# Patient Record
Sex: Male | Born: 1950
Health system: Southern US, Community
[De-identification: ages and names within clinical notes are randomized; demographics above are authoritative.]

## PROBLEM LIST (undated history)

## (undated) DIAGNOSIS — N189 Chronic kidney disease, unspecified: Secondary | ICD-10-CM

## (undated) DIAGNOSIS — K219 Gastro-esophageal reflux disease without esophagitis: Secondary | ICD-10-CM

## (undated) DIAGNOSIS — F32A Depression, unspecified: Secondary | ICD-10-CM

## (undated) DIAGNOSIS — H269 Unspecified cataract: Secondary | ICD-10-CM

## (undated) DIAGNOSIS — E785 Hyperlipidemia, unspecified: Secondary | ICD-10-CM

## (undated) DIAGNOSIS — E349 Endocrine disorder, unspecified: Secondary | ICD-10-CM

## (undated) DIAGNOSIS — B009 Herpesviral infection, unspecified: Secondary | ICD-10-CM

## (undated) DIAGNOSIS — G4730 Sleep apnea, unspecified: Secondary | ICD-10-CM

## (undated) DIAGNOSIS — G4733 Obstructive sleep apnea (adult) (pediatric): Secondary | ICD-10-CM

## (undated) DIAGNOSIS — I10 Essential (primary) hypertension: Secondary | ICD-10-CM

## (undated) DIAGNOSIS — S066X9A Traumatic subarachnoid hemorrhage with loss of consciousness of unspecified duration, initial encounter: Secondary | ICD-10-CM

## (undated) DIAGNOSIS — S065XAA Traumatic subdural hemorrhage with loss of consciousness status unknown, initial encounter: Secondary | ICD-10-CM

## (undated) DIAGNOSIS — S065X9A Traumatic subdural hemorrhage with loss of consciousness of unspecified duration, initial encounter: Secondary | ICD-10-CM

## (undated) DIAGNOSIS — Z86010 Personal history of colonic polyps: Secondary | ICD-10-CM

## (undated) DIAGNOSIS — S066XAA Traumatic subarachnoid hemorrhage with loss of consciousness status unknown, initial encounter: Secondary | ICD-10-CM

## (undated) DIAGNOSIS — M1A9XX0 Chronic gout, unspecified, without tophus (tophi): Secondary | ICD-10-CM

## (undated) DIAGNOSIS — Z9989 Dependence on other enabling machines and devices: Secondary | ICD-10-CM

## (undated) DIAGNOSIS — N529 Male erectile dysfunction, unspecified: Secondary | ICD-10-CM

## (undated) HISTORY — PX: EYE SURGERY: SHX253

## (undated) HISTORY — PX: COLONOSCOPY: SHX174

## (undated) HISTORY — PX: TENDON REPAIR: SHX5111

## (undated) HISTORY — PX: ESOPHAGOGASTRODUODENOSCOPY: SHX1529

## (undated) HISTORY — DX: Traumatic subarachnoid hemorrhage with loss of consciousness status unknown, initial encounter: S06.6XAA

## (undated) HISTORY — DX: Essential (primary) hypertension: I10

## (undated) HISTORY — DX: Unspecified cataract: H26.9

## (undated) HISTORY — DX: Chronic kidney disease, unspecified: N18.9

## (undated) HISTORY — DX: Endocrine disorder, unspecified: E34.9

## (undated) HISTORY — DX: Traumatic subdural hemorrhage with loss of consciousness of unspecified duration, initial encounter: S06.5X9A

## (undated) HISTORY — DX: Personal history of colonic polyps: Z86.010

## (undated) HISTORY — DX: Depression, unspecified: F32.A

## (undated) HISTORY — DX: Obstructive sleep apnea (adult) (pediatric): G47.33

## (undated) HISTORY — DX: Herpesviral infection, unspecified: B00.9

## (undated) HISTORY — DX: Dependence on other enabling machines and devices: Z99.89

## (undated) HISTORY — DX: Traumatic subarachnoid hemorrhage with loss of consciousness of unspecified duration, initial encounter: S06.6X9A

## (undated) HISTORY — DX: Hyperlipidemia, unspecified: E78.5

## (undated) HISTORY — DX: Traumatic subdural hemorrhage with loss of consciousness status unknown, initial encounter: S06.5XAA

## (undated) HISTORY — DX: Chronic gout, unspecified, without tophus (tophi): M1A.9XX0

## (undated) HISTORY — DX: Gastro-esophageal reflux disease without esophagitis: K21.9

## (undated) HISTORY — DX: Sleep apnea, unspecified: G47.30

## (undated) HISTORY — DX: Male erectile dysfunction, unspecified: N52.9

---
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
## 2001-01-20 ENCOUNTER — Ambulatory Visit (HOSPITAL_COMMUNITY): Admission: RE | Admit: 2001-01-20 | Discharge: 2001-01-20 | Payer: Self-pay

## 2005-06-14 ENCOUNTER — Encounter: Admission: RE | Admit: 2005-06-14 | Discharge: 2005-06-14 | Payer: Self-pay | Admitting: Nephrology

## 2005-07-12 ENCOUNTER — Encounter: Admission: RE | Admit: 2005-07-12 | Discharge: 2005-07-12 | Payer: Self-pay | Admitting: Nephrology

## 2007-04-25 ENCOUNTER — Ambulatory Visit (HOSPITAL_COMMUNITY): Admission: RE | Admit: 2007-04-25 | Discharge: 2007-04-25 | Payer: Self-pay | Admitting: *Deleted

## 2007-06-21 ENCOUNTER — Encounter (INDEPENDENT_AMBULATORY_CARE_PROVIDER_SITE_OTHER): Payer: Self-pay | Admitting: Surgery

## 2007-06-21 ENCOUNTER — Ambulatory Visit (HOSPITAL_COMMUNITY): Admission: RE | Admit: 2007-06-21 | Discharge: 2007-06-21 | Payer: Self-pay | Admitting: General Surgery

## 2010-08-11 NOTE — Op Note (Signed)
NAMEJUSTINN, Devin Howell             ACCOUNT NO.:  [id number]   MEDICAL RECORD NO.:  [id number]          PATIENT TYPE:  AMB   LOCATION:  DAY                          FACILITY:  [facility]   PHYSICIAN:  Thomas A. Cornett, M.D.DATE OF BIRTH:  10-Sep-1950   DATE OF PROCEDURE:  06/21/2007  DATE OF DISCHARGE:                               OPERATIVE REPORT   PREOPERATIVE DIAGNOSIS:  Prolapse, grade 3 internal hemorrhoids.   POSTOPERATIVE DIAGNOSIS:  Prolapse, grade 3 internal hemorrhoids.   PROCEDURE:  Procedure for prolapse and hemorrhoids (PPH).   SURGEON:  Dortha Schwalbe, MD.   ASSISTANTOllen Gross. Vernell Morgans, MD   ANESTHESIA:  General endotracheal anesthesia, 0.25% Sensorcaine local.   ESTIMATED BLOOD LOSS:  Twenty mL.   SPECIMEN:  A ring of hemorrhoidal tissue to pathology.   INDICATIONS FOR PROCEDURE:  The patient is a 60 year old male who has  had longstanding internal hemorrhoids.  We discussed options including  hemorrhoidectomy versus PPH and he elected to go with PPH.  I discussed  with him the complications that were unique with PPH which include  bleeding, infection and a recurrence rate of hemorrhoids about 20% even  with a successful PPH.  I discussed hemorrhoidectomy as well with him as  an alternative but he wished to undergo PPH due to decreased postop  pain.  After discussing the risks of the procedure he proceeded to agree  with PPH.   DESCRIPTION OF PROCEDURE:  The patient was brought to the operating room  and placed supine.  He was intubated and placed under general anesthesia  and then placed in a prone jackknife position, appropriately padded.  The buttocks taped apart.  We jackknifed the bed.  After sterile prep  and drape of the anal canal digital examination was done.  Tone was  normal.  We then used two fingers to dilate up his anal canal and then  the large anoscope was placed.  After placing the scope we examined and  saw he had significant grade 3 prolapsed  hemorrhoids but very little  external disease.  We then placed a pursestring suture of 2-0 Prolene 4  cm above the dentate line circumferentially only getting mucosal bites.  After we did this we placed the retractor that comes with the [place]  stapling device.  We were able to place this retractor in while pulling  the suture out the center of it.  We then brought the [facility] stapling  device on to the field.  We slid the anvil past the suture and then tied  the pursestring suture down around it.  The tails were brought out  through the device and air knot was tied.  Under tension we then turned  the stapler and close down the jaws as we advanced the stapler until the  4 cm mark was below the dentate line.  As we pulled up on the suture  unfortunately simultaneously we fired the stapler and the suture broke.  We removed the stapling device.  There was no tissue in the stapling  device.  We examined and saw that one half row  of staples in the  patient's posterior area had fired but no tissue was amputated with it.  We elected to repeat the procedure since this tissue slid out of the  stapler right before we fired it and it was unsuccessful.  I placed  another pursestring suture in a similar fashion above the staple line  circumferentially.  Once this was then placed a second stapler was  brought in to the field.  We were able to advance it and feel it pop  into place appropriately above the pursestring suture.  We then tied the  pursestring suture down in a similar fashion as before, brought out the  sutures through the stapling device.  We then closed the jaws of the EEA  stapler that is designed specifically for this procedure down slowly  until we had closed it all the way.  We then fired it and this had a  much better sound to it and felt more like we had better tissue in it.  After we removed the stapler we examined the ring of tissue.  It was  symmetrical.  It consisted of mucosa and  hemorrhoid tissue only without  any muscle or any other signs of any deeper tissue penetration.  Staple  line had a little bit of oozing and over sewed four points with some pop-  off #2 Vicryl sutures.  The staple line appeared to be approximately 4  cm above the dentate line.  The retractor easily went past the staple  line and also examined my finger and felt my finger go past the staple  line into the distal rectum.  Again I saw no evidence of bleeding after  irrigation.  We watched this for about 5 minutes and the staple line  looked good.  I then placed a plug of Gelfoam wrapped with Surgicel into  this area.  At this point the patient was then covered with an ABD pad.  He was then placed supine, extubated, taken to recovery in satisfactory  condition.  Estimated blood loss was approximately 20 mL for this  procedure.  The patient tolerated the procedure well.  All final counts  of sponge, needle and instruments were found to be correct for this  portion of the case.      Thomas A. Cornett, M.D.  Electronically Signed     TAC/MEDQ  D:  06/21/2007  T:  06/21/2007  Job:  [id number]   cc:   Georgiana Spinner, M.D.  Fax: [phone]

## 2010-08-11 NOTE — Op Note (Signed)
Devin Howell, PENDER NO.:  [id number]   MEDICAL RECORD NO.:  [id number]          PATIENT TYPE:  AMB   LOCATION:  ENDO                         FACILITY:  [facility]   PHYSICIAN:  Georgiana Spinner, M.D.    DATE OF BIRTH:  [birth year]   DATE OF PROCEDURE:  04/25/2007  DATE OF DISCHARGE:                               OPERATIVE REPORT   PROCEDURE:  Colonoscopy.   INDICATIONS:  Colon cancer screening.   ANESTHESIA:  Fentanyl 100 mcg, Versed 10 mg.   PROCEDURE:  With the patient mildly sedated in the left lateral  decubitus position, rectal examination was performed which was  unremarkable.  Subsequently the Pentax videoscopic colonoscope was  inserted the rectum and passed under direct vision to the cecum  identified by ileocecal valve and appendiceal orifice both which were  photographed.  From this point the colonoscope was slowly withdrawn  taking circumferential views of colonic mucosa stopping only in the  rectum which appeared normal on direct showed hemorrhoids on retroflexed  view.  The endoscope was straightened and withdrawn.  The patient's  vital signs, pulse oximeter remained stable.  The patient tolerated  procedure well without apparent complication.   FINDINGS:  Internal hemorrhoids, otherwise unremarkable exam   PLAN:  Because of family history we will repeat examination in 5 years.           ______________________________  Georgiana Spinner, M.D.     GMO/MEDQ  D:  04/25/2007  T:  04/25/2007  Job:  [id number]

## 2010-08-14 NOTE — Procedures (Signed)
[facility]  Patient:    Devin Howell, Devin Howell Visit Number: [id number] MRN: [id number]          Service Type: END Location: ENDO Attending Physician:  Sabino Gasser Proc. Date: 01/20/01 Admit Date:  01/20/2001 Discharge Date: 01/20/2001                             Procedure Report  PROCEDURE:  Colonoscopy.  INDICATION:  Colon cancer screening.  Mother with history of colon cancer.  ANESTHESIA:  Demerol 50, Versed 6.5 mg.  DESCRIPTION OF PROCEDURE:  With the patient mildly sedated in the left lateral decubitus position, the Olympus videoscopic colonoscope was inserted into the rectum after a normal rectal exam and passed under direct vision to the cecum, identified by the ileocecal valve and appendiceal orifice, both of which were photographed.  From this point, the colonoscope was slowly withdrawn, taking circumferential views of the entire colonic mucosa, stopping in the rectum which appeared normal on direct view and showed hemorrhoids on retroflexed view.  The endoscope was straightened and withdrawn.  The patients vital signs and pulse oximeter remained stable.  The patient tolerated the procedure well without apparent complications.  FINDINGS:  Internal hemorrhoids.  Otherwise unremarkable examination.  PLAN:  Repeat examination in possibly five years. Attending Physician:  Sabino Gasser DD:  01/20/01 TD:  01/22/01 Job: [id number]

## 2010-12-21 LAB — CBC
HCT: 46.7
Platelets: 191

## 2010-12-21 LAB — BASIC METABOLIC PANEL
BUN: 54 — ABNORMAL HIGH
CO2: 26
Calcium: 9.5
Chloride: 103
GFR calc Af Amer: 37 — ABNORMAL LOW
Sodium: 137

## 2010-12-21 LAB — DIFFERENTIAL
Basophils Absolute: 0
Eosinophils Absolute: 0.1
Eosinophils Relative: 2
Lymphs Abs: 1.6
Neutrophils Relative %: 68

## 2012-11-07 ENCOUNTER — Other Ambulatory Visit: Payer: Self-pay | Admitting: Neurosurgery

## 2012-11-07 DIAGNOSIS — M47817 Spondylosis without myelopathy or radiculopathy, lumbosacral region: Secondary | ICD-10-CM

## 2012-11-09 ENCOUNTER — Ambulatory Visit
Admission: RE | Admit: 2012-11-09 | Discharge: 2012-11-09 | Disposition: A | Discharge status: Home / Self Care | Payer: BC Managed Care – PPO | Source: Ambulatory Visit | Attending: Neurosurgery | Admitting: Neurosurgery

## 2012-11-09 DIAGNOSIS — M47817 Spondylosis without myelopathy or radiculopathy, lumbosacral region: Secondary | ICD-10-CM

## 2013-10-02 ENCOUNTER — Telehealth: Payer: Self-pay | Admitting: Internal Medicine

## 2013-10-04 ENCOUNTER — Encounter: Payer: Self-pay | Admitting: Internal Medicine

## 2013-12-25 NOTE — Telephone Encounter (Signed)
Appt scheduled 12-27-13/yf

## 2013-12-27 ENCOUNTER — Encounter: Payer: BC Managed Care – PPO | Admitting: Internal Medicine

## 2014-09-09 ENCOUNTER — Other Ambulatory Visit: Payer: Self-pay | Admitting: Nephrology

## 2014-09-09 DIAGNOSIS — N179 Acute kidney failure, unspecified: Secondary | ICD-10-CM

## 2014-09-11 ENCOUNTER — Ambulatory Visit
Admission: RE | Admit: 2014-09-11 | Discharge: 2014-09-11 | Disposition: A | Discharge status: Home / Self Care | Payer: Medicare Other | Source: Ambulatory Visit | Attending: Nephrology | Admitting: Nephrology

## 2014-09-11 DIAGNOSIS — N179 Acute kidney failure, unspecified: Secondary | ICD-10-CM

## 2015-01-02 ENCOUNTER — Other Ambulatory Visit (HOSPITAL_COMMUNITY): Payer: Self-pay | Admitting: Nephrology

## 2015-01-02 DIAGNOSIS — I10 Essential (primary) hypertension: Secondary | ICD-10-CM

## 2015-01-06 ENCOUNTER — Encounter (HOSPITAL_COMMUNITY): Payer: Medicare Other

## 2015-01-08 ENCOUNTER — Ambulatory Visit (HOSPITAL_COMMUNITY)
Admission: RE | Admit: 2015-01-08 | Discharge: 2015-01-08 | Disposition: A | Discharge status: Home / Self Care | Payer: Medicare Other | Source: Ambulatory Visit | Attending: Cardiovascular Disease | Admitting: Cardiovascular Disease

## 2015-01-08 DIAGNOSIS — I10 Essential (primary) hypertension: Secondary | ICD-10-CM | POA: Diagnosis not present

## 2015-07-01 ENCOUNTER — Other Ambulatory Visit: Payer: Self-pay | Admitting: Family Medicine

## 2015-07-01 DIAGNOSIS — M545 Low back pain: Secondary | ICD-10-CM

## 2015-07-06 ENCOUNTER — Other Ambulatory Visit: Payer: Medicare Other

## 2016-04-14 ENCOUNTER — Ambulatory Visit: Payer: Medicare Other | Admitting: Primary Care

## 2016-04-19 DIAGNOSIS — G4733 Obstructive sleep apnea (adult) (pediatric): Secondary | ICD-10-CM | POA: Diagnosis not present

## 2016-04-20 ENCOUNTER — Encounter: Payer: Self-pay | Admitting: Primary Care

## 2016-04-20 ENCOUNTER — Ambulatory Visit (INDEPENDENT_AMBULATORY_CARE_PROVIDER_SITE_OTHER): Payer: Medicare HMO | Admitting: Primary Care

## 2016-04-20 VITALS — BP 146/84 | HR 100 | Temp 97.8°F | Ht 66.0 in | Wt 228.4 lb

## 2016-04-20 DIAGNOSIS — E349 Endocrine disorder, unspecified: Secondary | ICD-10-CM

## 2016-04-20 DIAGNOSIS — E785 Hyperlipidemia, unspecified: Secondary | ICD-10-CM | POA: Insufficient documentation

## 2016-04-20 DIAGNOSIS — M1A9XX0 Chronic gout, unspecified, without tophus (tophi): Secondary | ICD-10-CM

## 2016-04-20 DIAGNOSIS — N189 Chronic kidney disease, unspecified: Secondary | ICD-10-CM

## 2016-04-20 DIAGNOSIS — A6000 Herpesviral infection of urogenital system, unspecified: Secondary | ICD-10-CM | POA: Diagnosis not present

## 2016-04-20 DIAGNOSIS — I10 Essential (primary) hypertension: Secondary | ICD-10-CM

## 2016-04-20 DIAGNOSIS — N183 Chronic kidney disease, stage 3 unspecified: Secondary | ICD-10-CM | POA: Insufficient documentation

## 2016-04-20 DIAGNOSIS — R69 Illness, unspecified: Secondary | ICD-10-CM | POA: Diagnosis not present

## 2016-04-20 MED ORDER — VALACYCLOVIR HCL 1 G PO TABS: ORAL_TABLET | ORAL | 5 refills | Status: DC

## 2016-04-20 MED ORDER — ALLOPURINOL 300 MG PO TABS
300.0000 mg | ORAL_TABLET | Freq: Every day | ORAL | 3 refills | Status: DC
Start: 2016-04-20 — End: 2016-04-20

## 2016-04-20 MED ORDER — VALACYCLOVIR HCL 1 G PO TABS
ORAL_TABLET | ORAL | 5 refills | Status: DC
Start: 2016-04-20 — End: 2016-04-20

## 2016-04-20 MED ORDER — ALLOPURINOL 300 MG PO TABS: 300.0000 mg | ORAL_TABLET | Freq: Every day | ORAL | 3 refills | Status: DC

## 2016-04-20 NOTE — Assessment & Plan Note (Signed)
Discussed that I do not manage testosterone injections and that he will need to follow with Urology. Referral placed for Urology evaluation and management.

## 2016-04-20 NOTE — Patient Instructions (Signed)
I sent refills for Valtrex and allopurinol to your pharmacy.  You will be contacted regarding your referral to Urology for management of testosterone deficiency.  Please let us know if you have not heard back within one week.   Please schedule a physical with me in 2018 at your convenience. You may also schedule a lab only appointment 3-4 days prior. We will discuss your lab results in detail during your physical.  It was a pleasure to meet you today! Please don't hesitate to call me with any questions. Welcome to [facility]!

## 2016-04-20 NOTE — Progress Notes (Signed)
Pre visit review using our clinic review tool, if applicable. No additional management support is needed unless otherwise documented below in the visit note. 

## 2016-04-20 NOTE — Assessment & Plan Note (Signed)
Managed on Amlodipine 5 mg, BP above goal today, continue to monitor.

## 2016-04-20 NOTE — Progress Notes (Signed)
Subjective:    Patient ID: Devin Howell, male    DOB: 09-Jan-1951, 66 y.o.   MRN: [id number]  HPI  Devin Howell is a 66 year old male who presents today to establish care and discuss the problems mentioned below. Will obtain old records. His last physical was in March-April 2017.  1) Essential Hypertension: Currently managed on Amlodipine 5 mg per his nephrologist. He denies chest pain, dizziness, visual changes.  2) Gout: Currently managed on allopurinol 300 mg daily. Strong family history in his father. His last flare was years ago.  3) Hyperlipidemia: Currently managed on atorvastatin 20 mg. His last cholesterol check was during his physical last Spring. He denies myalgias.  4) Decreased Renal Function: Following with Dr. Mercy Moore through [facility] annually. Managed on Amlodipine and Lasix. Baseline Creatinine of 1.4-1.6.  5) Herpes Simplex: Genital Herpes. No breakout in 5-6 years. He is needing a refill of his Valtrex medication as he likes to have these on hand.  6) Erectile Dysfunction/Testosterone Deficiency: Currently managed on sildenafil 20 mg. Gets a prescription for 40 pills monthly as he takes 8-10 pills at a time. Currently managed on Testosterone. He injects 1 cc every 2 weeks. This was prescribed by his prior PCP. No history of testicular injury.  Review of Systems  Eyes: Negative for visual disturbance.  Respiratory: Negative for shortness of breath.   Cardiovascular: Negative for chest pain.  Genitourinary: Negative for genital sores.       Testosterone deficiency. Erectile dysfunction.   Musculoskeletal:       No recent gout flares  Neurological: Negative for dizziness.       Past Medical History:  Diagnosis Date  . Chronic gout   . CKD (chronic kidney disease)   . Erectile dysfunction   . GERD (gastroesophageal reflux disease)   . Herpes simplex   . Hyperlipidemia   . Hypertension   . OSA on CPAP   . Testosterone deficiency      Social  History   Social History  . Marital status: Married    Spouse name: N/A  . Number of children: N/A  . Years of education: N/A   Occupational History  . Not on file.   Social History Main Topics  . Smoking status: Never Smoker  . Smokeless tobacco: Never Used  . Alcohol use Yes  . Drug use: Unknown  . Sexual activity: Not on file   Other Topics Concern  . Not on file   Social History Narrative  . No narrative on file    Past Surgical History:  Procedure Laterality Date  . TONSILLECTOMY AND ADENOIDECTOMY  1960    No family history on file.  No Known Allergies  No current outpatient prescriptions on file prior to visit.   No current facility-administered medications on file prior to visit.     BP (!) 146/84   Pulse 100   Temp 97.8 F (36.6 C) (Oral)   Ht 5\' 6"  (1.676 m)   Wt 228 lb 6.4 oz (103.6 kg)   SpO2 97%   BMI 36.86 kg/m    Objective:   Physical Exam  Constitutional: He is oriented to person, place, and time. He appears well-nourished.  Neck: Neck supple.  Cardiovascular: Normal rate and regular rhythm.   Pulmonary/Chest: Effort normal and breath sounds normal. He has no wheezes. He has no rales.  Neurological: He is alert and oriented to person, place, and time.  Skin: Skin is warm and dry.  Psychiatric:  He has a normal mood and affect.          Assessment & Plan:

## 2016-04-20 NOTE — Assessment & Plan Note (Signed)
Infrequent flares since allopurinol initiation. Continue same, refills sent to pharmacy.

## 2016-04-20 NOTE — Assessment & Plan Note (Signed)
Infrequent breakouts, refill provided for Valtex.

## 2016-04-20 NOTE — Assessment & Plan Note (Signed)
Following with nephrology, baseline creatinine of 1.4-1.6. Managed on Lasix and Amlodipine.

## 2016-04-20 NOTE — Assessment & Plan Note (Signed)
Managed on atorvastatin. Will obtain records for labs. Repeat during physical this Spring/Summer.

## 2016-04-27 ENCOUNTER — Other Ambulatory Visit: Payer: Self-pay | Admitting: Primary Care

## 2016-04-27 DIAGNOSIS — N529 Male erectile dysfunction, unspecified: Secondary | ICD-10-CM

## 2016-04-27 NOTE — Telephone Encounter (Signed)
Received faxed refill request for  sildenafil (REVATIO) 20 MG tablet  Medication have not been prescribed by Devin Howell. Last seen on 04/20/2016

## 2016-04-28 MED ORDER — SILDENAFIL CITRATE 20 MG PO TABS: ORAL_TABLET | ORAL | 5 refills | Status: DC

## 2016-05-09 ENCOUNTER — Emergency Department (HOSPITAL_COMMUNITY): Payer: Medicare HMO

## 2016-05-09 ENCOUNTER — Encounter (HOSPITAL_COMMUNITY): Payer: Self-pay | Admitting: Emergency Medicine

## 2016-05-09 ENCOUNTER — Emergency Department (HOSPITAL_COMMUNITY)
Admission: EM | Admit: 2016-05-09 | Discharge: 2016-05-09 | Disposition: A | Discharge status: Home / Self Care | Payer: Medicare HMO | Attending: Emergency Medicine | Admitting: Emergency Medicine

## 2016-05-09 DIAGNOSIS — J9801 Acute bronchospasm: Secondary | ICD-10-CM | POA: Diagnosis not present

## 2016-05-09 DIAGNOSIS — N189 Chronic kidney disease, unspecified: Secondary | ICD-10-CM | POA: Diagnosis not present

## 2016-05-09 DIAGNOSIS — Z79899 Other long term (current) drug therapy: Secondary | ICD-10-CM | POA: Diagnosis not present

## 2016-05-09 DIAGNOSIS — R0600 Dyspnea, unspecified: Secondary | ICD-10-CM

## 2016-05-09 DIAGNOSIS — I129 Hypertensive chronic kidney disease with stage 1 through stage 4 chronic kidney disease, or unspecified chronic kidney disease: Secondary | ICD-10-CM | POA: Insufficient documentation

## 2016-05-09 DIAGNOSIS — R0602 Shortness of breath: Secondary | ICD-10-CM | POA: Diagnosis not present

## 2016-05-09 LAB — URINALYSIS, ROUTINE W REFLEX MICROSCOPIC
BACTERIA UA: NONE SEEN
BILIRUBIN URINE: NEGATIVE
Glucose, UA: NEGATIVE mg/dL
Ketones, ur: 5 mg/dL — AB
Leukocytes, UA: NEGATIVE
NITRITE: NEGATIVE
PH: 6 (ref 5.0–8.0)
Protein, ur: 30 mg/dL — AB
SPECIFIC GRAVITY, URINE: 1.006 (ref 1.005–1.030)
SQUAMOUS EPITHELIAL / LPF: NONE SEEN

## 2016-05-09 LAB — BASIC METABOLIC PANEL
ANION GAP: 15 (ref 5–15)
BUN: 14 mg/dL (ref 6–20)
CO2: 26 mmol/L (ref 22–32)
Calcium: 9.8 mg/dL (ref 8.9–10.3)
Chloride: 96 mmol/L — ABNORMAL LOW (ref 101–111)
Creatinine, Ser: 1.4 mg/dL — ABNORMAL HIGH (ref 0.61–1.24)
GFR calc Af Amer: 59 mL/min — ABNORMAL LOW (ref >60)
GFR, EST NON AFRICAN AMERICAN: 50 mL/min — AB (ref >60)
GLUCOSE: 127 mg/dL — AB (ref 65–99)
POTASSIUM: 3.2 mmol/L — AB (ref 3.5–5.1)
Sodium: 137 mmol/L (ref 135–145)

## 2016-05-09 LAB — CBC
HEMATOCRIT: 44.3 % (ref 39.0–52.0)
HEMOGLOBIN: 15.3 g/dL (ref 13.0–17.0)
MCH: 34 pg (ref 26.0–34.0)
MCHC: 34.5 g/dL (ref 30.0–36.0)
MCV: 98.4 fL (ref 78.0–100.0)
Platelets: 133 10*3/uL — ABNORMAL LOW (ref 150–400)
RBC: 4.5 MIL/uL (ref 4.22–5.81)
RDW: 13.6 % (ref 11.5–15.5)
WBC: 5.5 10*3/uL (ref 4.0–10.5)

## 2016-05-09 LAB — I-STAT TROPONIN, ED: TROPONIN I, POC: 0.01 ng/mL (ref 0.00–0.08)

## 2016-05-09 LAB — BRAIN NATRIURETIC PEPTIDE: B NATRIURETIC PEPTIDE 5: 22.4 pg/mL (ref 0.0–100.0)

## 2016-05-09 MED ORDER — ALBUTEROL SULFATE (2.5 MG/3ML) 0.083% IN NEBU
5.0000 mg | INHALATION_SOLUTION | Freq: Once | RESPIRATORY_TRACT | Status: AC
  Administered 2016-05-09: 5 mg via RESPIRATORY_TRACT
  Filled 2016-05-09: qty 6

## 2016-05-09 MED ORDER — ALBUTEROL SULFATE HFA 108 (90 BASE) MCG/ACT IN AERS
1.0000 | INHALATION_SPRAY | RESPIRATORY_TRACT | Status: DC | PRN
  Administered 2016-05-09: 2 via RESPIRATORY_TRACT
  Filled 2016-05-09: qty 6.7

## 2016-05-09 MED ORDER — PREDNISONE 20 MG PO TABS: ORAL_TABLET | ORAL | 0 refills | Status: DC

## 2016-05-09 NOTE — ED Provider Notes (Signed)
[place] DEPT Provider Note   CSN: [id number] Arrival date & time: 05/09/16  1104     History   Chief Complaint Chief Complaint  Patient presents with  . Shortness of Breath  . Dizziness    HPI Devin Howell is a 66 y.o. male.  HPI Patient states he wears CPAP at night. Had worsening shortness of breath throughout the night. Denied chest pain or cough. Shortness breath is worse with lying flat. No new lower extremity swelling or asymmetry. No recent extended travel or immobilization. Denies fever or chills. Past Medical History:  Diagnosis Date  . Chronic gout   . CKD (chronic kidney disease)   . Erectile dysfunction   . GERD (gastroesophageal reflux disease)   . Herpes simplex   . Hyperlipidemia   . Hypertension   . OSA on CPAP   . Testosterone deficiency     Patient Active Problem List   Diagnosis Date Noted  . Chronic gout without tophus 04/20/2016  . Genital herpes simplex 04/20/2016  . Testosterone deficiency 04/20/2016  . CKD (chronic kidney disease) 04/20/2016  . Essential hypertension 04/20/2016  . Hyperlipidemia 04/20/2016    Past Surgical History:  Procedure Laterality Date  . TONSILLECTOMY AND ADENOIDECTOMY  1960       Home Medications    Prior to Admission medications   Medication Sig Start Date End Date Taking? Authorizing Provider  allopurinol (ZYLOPRIM) 300 MG tablet Take 1 tablet (300 mg total) by mouth daily. 04/20/16  Yes Pleas Koch, NP  amLODipine (NORVASC) 5 MG tablet Take 5 mg by mouth daily.   Yes Historical Provider, MD  atorvastatin (LIPITOR) 20 MG tablet Take 20 mg by mouth daily.   Yes Historical Provider, MD  clobetasol ointment (TEMOVATE) [id number] % Apply 1 application topically as needed (for ecsema).  04/05/16  Yes Historical Provider, MD  furosemide (LASIX) 40 MG tablet Take 40 mg by mouth daily.   Yes Historical Provider, MD  testosterone cypionate (DEPOTESTOTERONE CYPIONATE) 100 MG/ML injection Inject 200 mg into  the muscle every 14 (fourteen) days. For IM use only   Yes Historical Provider, MD  predniSONE (DELTASONE) 20 MG tablet 3 tabs po day one, then 2 tabs daily x 4 days 05/09/16   Julianne Rice, MD  sildenafil (REVATIO) 20 MG tablet Take 3-5 tablets by mouth as needed. 04/28/16   Pleas Koch, NP  valACYclovir (VALTREX) 1000 MG tablet Take 1 tablet by mouth twice daily for 3-5 days as needed for outbreaks. Patient taking differently: Take 1,000 mg by mouth 2 (two) times daily as needed (for outbreaks).  04/20/16   Pleas Koch, NP    Family History No family history on file.  Social History Social History  Substance Use Topics  . Smoking status: Never Smoker  . Smokeless tobacco: Never Used  . Alcohol use Yes     Allergies   Patient has no known allergies.   Review of Systems Review of Systems  Constitutional: Negative for chills, fatigue and fever.  HENT: Negative for congestion, sinus pain, sinus pressure and sore throat.   Respiratory: Positive for shortness of breath. Negative for cough, chest tightness and wheezing.   Cardiovascular: Negative for chest pain, palpitations and leg swelling.  Gastrointestinal: Negative for abdominal pain, constipation, diarrhea, nausea and vomiting.  Musculoskeletal: Negative for back pain, myalgias, neck pain and neck stiffness.  Skin: Negative for rash and wound.  Neurological: Positive for light-headedness. Negative for dizziness, weakness, numbness and headaches.  Psychiatric/Behavioral: The  patient is nervous/anxious.   All other systems reviewed and are negative.    Physical Exam Updated Vital Signs BP 100/75   Pulse 85   Temp 97.8 F (36.6 C) (Oral)   Resp 16   Ht 5' 7.5" (1.715 m)   Wt 227 lb (103 kg)   SpO2 94%   BMI 35.03 kg/m   Physical Exam  Constitutional: He is oriented to person, place, and time. He appears well-developed and well-nourished.  Anxious appearing  HENT:  Head: Normocephalic and atraumatic.    Mouth/Throat: Oropharynx is clear and moist. No oropharyngeal exudate.  Eyes: EOM are normal. Pupils are equal, round, and reactive to light.  Neck: Normal range of motion. Neck supple. No JVD present.  Cardiovascular: Normal rate and regular rhythm.  Exam reveals no gallop and no friction rub.   No murmur heard. Pulmonary/Chest: Effort normal.  Diminished air movement and a few crackles in bilateral bases  Abdominal: Soft. Bowel sounds are normal. There is no tenderness. There is no rebound and no guarding.  Musculoskeletal: Normal range of motion. He exhibits edema. He exhibits no tenderness.  1+ bilateral pitting edema. No calf tenderness or asymmetry.  Neurological: He is alert and oriented to person, place, and time.  Moves all extremities without deficit. Sensation fully intact.  Skin: Skin is warm and dry. Capillary refill takes less than 2 seconds. No rash noted. No erythema.  Psychiatric: His behavior is normal.  Anxious appearing  Nursing note and vitals reviewed.    ED Treatments / Results  Labs (all labs ordered are listed, but only abnormal results are displayed) Labs Reviewed  BASIC METABOLIC PANEL - Abnormal; Notable for the following:       Result Value   Potassium 3.2 (*)    Chloride 96 (*)    Glucose, Bld 127 (*)    Creatinine, Ser 1.40 (*)    GFR calc non Af Amer 50 (*)    GFR calc Af Amer 59 (*)    All other components within normal limits  CBC - Abnormal; Notable for the following:    Platelets 133 (*)    All other components within normal limits  URINALYSIS, ROUTINE W REFLEX MICROSCOPIC - Abnormal; Notable for the following:    Color, Urine STRAW (*)    Hgb urine dipstick SMALL (*)    Ketones, ur 5 (*)    Protein, ur 30 (*)    All other components within normal limits  BRAIN NATRIURETIC PEPTIDE  I-STAT TROPOININ, ED  CBG MONITORING, ED    EKG  EKG Interpretation None       Radiology Dg Chest 2 View  Result Date: 05/09/2016 CLINICAL DATA:   Shortness of Breath EXAM: CHEST  2 VIEW COMPARISON:  01/05/2016 FINDINGS: Lingular scarring. Low lung volumes. No confluent airspace opacities or effusions. Heart is normal size. IMPRESSION: No active cardiopulmonary disease. Electronically Signed   By: Rolm Baptise M.D.   On: 05/09/2016 11:55    Procedures Procedures (including critical care time)  Medications Ordered in ED Medications  albuterol (PROVENTIL HFA;VENTOLIN HFA) 108 (90 Base) MCG/ACT inhaler 1-2 puff (2 puffs Inhalation Given 05/09/16 1550)  albuterol (PROVENTIL) (2.5 MG/3ML) 0.083% nebulizer solution 5 mg (5 mg Nebulization Given 05/09/16 1255)     Initial Impression / Assessment and Plan / ED Course  I have reviewed the triage vital signs and the nursing notes.  Pertinent labs & imaging results that were available during my care of the patient were reviewed by  me and considered in my medical decision making (see chart for details).     Patient states he is feeling much better after breathing treatment. Chest x-ray is normal. Will start on short course of prednisone and give albuterol inhaler. Return precautions given.  Final Clinical Impressions(s) / ED Diagnoses   Final diagnoses:  Dyspnea, unspecified type  Bronchospasm    New Prescriptions Discharge Medication List as of 05/09/2016  3:44 PM    START taking these medications   Details  predniSONE (DELTASONE) 20 MG tablet 3 tabs po day one, then 2 tabs daily x 4 days, Print         Julianne Rice, MD 05/09/16 1651

## 2016-05-09 NOTE — ED Triage Notes (Signed)
Pt reports that he uses a CPAP machine at night and seemed like it was not working last night. Pt began to experience dizziness and shortness of breath. Pt went out to couch to try to rest but could not due to unable to catch his breath.

## 2016-06-08 ENCOUNTER — Other Ambulatory Visit: Payer: Self-pay | Admitting: Primary Care

## 2016-06-08 DIAGNOSIS — R066 Hiccough: Secondary | ICD-10-CM | POA: Diagnosis not present

## 2016-06-08 NOTE — Telephone Encounter (Signed)
Received refill request from pharmacy for  Chlorpromazine 25 mg, take one tablet by mouth 3 times a day as needed for hiccups  Medication is not on medication list Note shows that this was ordinally prescribed by Dr. Yaakov Guthrie.

## 2016-06-09 ENCOUNTER — Other Ambulatory Visit: Payer: Self-pay | Admitting: Primary Care

## 2016-06-09 DIAGNOSIS — K219 Gastro-esophageal reflux disease without esophagitis: Secondary | ICD-10-CM

## 2016-06-09 MED ORDER — PANTOPRAZOLE SODIUM 20 MG PO TBEC: 20.0000 mg | DELAYED_RELEASE_TABLET | Freq: Every day | ORAL | 1 refills | Status: DC

## 2016-06-10 ENCOUNTER — Encounter: Payer: Self-pay | Admitting: Primary Care

## 2016-06-10 ENCOUNTER — Ambulatory Visit (INDEPENDENT_AMBULATORY_CARE_PROVIDER_SITE_OTHER): Payer: Medicare HMO | Admitting: Primary Care

## 2016-06-10 ENCOUNTER — Ambulatory Visit: Payer: Medicare HMO | Admitting: Primary Care

## 2016-06-10 VITALS — BP 156/100 | HR 93 | Temp 98.1°F | Ht 66.0 in | Wt 225.8 lb

## 2016-06-10 DIAGNOSIS — R0602 Shortness of breath: Secondary | ICD-10-CM

## 2016-06-10 DIAGNOSIS — N189 Chronic kidney disease, unspecified: Secondary | ICD-10-CM

## 2016-06-10 DIAGNOSIS — R066 Hiccough: Secondary | ICD-10-CM

## 2016-06-10 MED ORDER — FUROSEMIDE 40 MG PO TABS: 40.0000 mg | ORAL_TABLET | Freq: Every day | ORAL | 1 refills | Status: DC

## 2016-06-10 MED ORDER — ALBUTEROL SULFATE HFA 108 (90 BASE) MCG/ACT IN AERS: INHALATION_SPRAY | RESPIRATORY_TRACT | 0 refills | Status: DC

## 2016-06-10 NOTE — Progress Notes (Signed)
Subjective:    Patient ID: Devin Howell, male    DOB: 10-26-1950, 66 y.o.   MRN: [id number]  HPI  Devin Howell is a 66 year old male with a history of CKD, essential hypertension, hyperlipidemia who presents today with a chief complaint of hiccups with shortness of breath.  1) Hiccups: History of 1 episode of recurrent hiccups 1 year ago which lasted several days. Over the past week he's had persistent hiccups with vomiting and shortness of breath. He's had little to no sleep due to these symptoms. He felt as though he had a blockage to his mid chest so he presented to the Urgent Care Monday this week. He was provided with a prescription for metoclopramide for hiccups and nausea, and an albuterol inhaler.  For his prior episode of hiccups 1 year ago he was provided with a prescription for chlorpromazine and pantoprazole 40 mg. He was provided with a prescription for pantoprazole 20 mg tablets earlier this week to use for hiccups.  Overall he's noticed improvement on metoclopramide and has taken 1 dose of pantoprazole. Symptoms have not dissipated but he's able to get some sleep at night. He's used the albuterol inhaler with much improvement to shortness of breath during his acute episodes. He denies chest pain, dizziness, weakness, fatigue, nausea, diaphoresis. He has noticed some esophageal burning and fullness to the epigastric region.  Review of Systems  Constitutional: Negative for fever.  Respiratory: Positive for shortness of breath. Negative for cough and wheezing.   Cardiovascular: Negative for chest pain.  Gastrointestinal: Positive for vomiting. Negative for abdominal pain and nausea.       Hiccups  Neurological: Negative for dizziness.       Past Medical History:  Diagnosis Date  . Chronic gout   . CKD (chronic kidney disease)   . Erectile dysfunction   . GERD (gastroesophageal reflux disease)   . Herpes simplex   . Hyperlipidemia   . Hypertension   . OSA on CPAP     . Testosterone deficiency      Social History   Social History  . Marital status: Married    Spouse name: N/A  . Number of children: N/A  . Years of education: N/A   Occupational History  . Not on file.   Social History Main Topics  . Smoking status: Never Smoker  . Smokeless tobacco: Never Used  . Alcohol use Yes  . Drug use: No  . Sexual activity: Not on file   Other Topics Concern  . Not on file   Social History Narrative  . No narrative on file    Past Surgical History:  Procedure Laterality Date  . TONSILLECTOMY AND ADENOIDECTOMY  1960    No family history on file.  No Known Allergies  Current Outpatient Prescriptions on File Prior to Visit  Medication Sig Dispense Refill  . allopurinol (ZYLOPRIM) 300 MG tablet Take 1 tablet (300 mg total) by mouth daily. 90 tablet 3  . amLODipine (NORVASC) 5 MG tablet Take 5 mg by mouth daily.    Marland Kitchen atorvastatin (LIPITOR) 20 MG tablet Take 20 mg by mouth daily.    . clobetasol ointment (TEMOVATE) 1.19 % Apply 1 application topically as needed (for ecsema).     . pantoprazole (PROTONIX) 20 MG tablet Take 1 tablet (20 mg total) by mouth daily. 90 tablet 1  . sildenafil (REVATIO) 20 MG tablet Take 3-5 tablets by mouth as needed. 40 tablet 5  . testosterone cypionate (DEPOTESTOTERONE CYPIONATE)  100 MG/ML injection Inject 200 mg into the muscle every 14 (fourteen) days. For IM use only    . valACYclovir (VALTREX) 1000 MG tablet Take 1 tablet by mouth twice daily for 3-5 days as needed for outbreaks. (Patient not taking: Reported on 06/10/2016) 15 tablet 5   No current facility-administered medications on file prior to visit.     BP (!) 156/100   Pulse 93   Temp 98.1 F (36.7 C) (Oral)   Ht 5\' 6"  (1.676 m)   Wt 225 lb 12.8 oz (102.4 kg)   SpO2 98%   BMI 36.45 kg/m    Objective:   Physical Exam  Constitutional: He appears well-nourished.  Neck: Neck supple.  Cardiovascular: Normal rate and regular rhythm.    Pulmonary/Chest: Effort normal and breath sounds normal. He has no wheezes.  Abdominal: Soft. Bowel sounds are normal. There is no tenderness.  Skin: Skin is warm and dry.          Assessment & Plan:

## 2016-06-10 NOTE — Progress Notes (Signed)
Pre visit review using our clinic review tool, if applicable. No additional management support is needed unless otherwise documented below in the visit note. 

## 2016-06-10 NOTE — Patient Instructions (Signed)
Start metoclopramide and pantoprazole 20 mg together daily for the next 5 days for hiccups.   Metoclopramide: Take every 6-8 hours. Pantoprazole: Take 1 tablet once daily.  Continue pantoprazole 20 mg for a total of 1 months duration.  Shortness of Breath: Inhale 2 puffs into the lungs every 6 to 8 hours as needed for wheezing and/or shortness of breath.   I sent refills of Lasix to your pharmacy.  Please notify me if no improvement by Monday next week.  It was a pleasure to see you today!

## 2016-06-10 NOTE — Assessment & Plan Note (Signed)
History of this 1 year ago, treated with chlorpromazine and pantoprazole with relief. Recent episode treated with metoclopramide at urgent care, provided patient with PPI today. Will have him continue metoclopramide for next several days the stop. Continue PPI for total of 4 weeks. He will update if no improvement.

## 2016-06-29 DIAGNOSIS — N200 Calculus of kidney: Secondary | ICD-10-CM | POA: Diagnosis not present

## 2016-06-29 DIAGNOSIS — R69 Illness, unspecified: Secondary | ICD-10-CM | POA: Diagnosis not present

## 2016-06-29 DIAGNOSIS — N5201 Erectile dysfunction due to arterial insufficiency: Secondary | ICD-10-CM | POA: Diagnosis not present

## 2016-07-19 ENCOUNTER — Other Ambulatory Visit: Payer: Self-pay | Admitting: Primary Care

## 2016-07-19 DIAGNOSIS — I10 Essential (primary) hypertension: Secondary | ICD-10-CM

## 2016-07-19 NOTE — Telephone Encounter (Signed)
Ok to refill? Electronically refill request for amLODipine (NORVASC) 5 MG tablet. Medication have not been prescribed by [facility] yet. Last seen on 06/10/2016

## 2016-07-20 DIAGNOSIS — G4733 Obstructive sleep apnea (adult) (pediatric): Secondary | ICD-10-CM | POA: Diagnosis not present

## 2016-09-06 DIAGNOSIS — N183 Chronic kidney disease, stage 3 (moderate): Secondary | ICD-10-CM | POA: Diagnosis not present

## 2016-09-06 DIAGNOSIS — Z6835 Body mass index (BMI) 35.0-35.9, adult: Secondary | ICD-10-CM | POA: Diagnosis not present

## 2016-09-06 DIAGNOSIS — I129 Hypertensive chronic kidney disease with stage 1 through stage 4 chronic kidney disease, or unspecified chronic kidney disease: Secondary | ICD-10-CM | POA: Diagnosis not present

## 2016-09-06 DIAGNOSIS — R809 Proteinuria, unspecified: Secondary | ICD-10-CM | POA: Diagnosis not present

## 2016-10-09 DIAGNOSIS — R69 Illness, unspecified: Secondary | ICD-10-CM | POA: Diagnosis not present

## 2016-10-15 ENCOUNTER — Telehealth: Payer: Self-pay

## 2016-10-15 NOTE — Telephone Encounter (Signed)
Patient is on the list for [employer] 2018 and may be a good candidate for an AWV. Please let me know if/when appt is scheduled.   

## 2016-10-19 DIAGNOSIS — R69 Illness, unspecified: Secondary | ICD-10-CM | POA: Diagnosis not present

## 2016-10-19 NOTE — Telephone Encounter (Signed)
Scheduled 12/13/16

## 2016-10-27 DIAGNOSIS — G4733 Obstructive sleep apnea (adult) (pediatric): Secondary | ICD-10-CM | POA: Diagnosis not present

## 2016-11-16 DIAGNOSIS — R69 Illness, unspecified: Secondary | ICD-10-CM | POA: Diagnosis not present

## 2016-12-08 DIAGNOSIS — R69 Illness, unspecified: Secondary | ICD-10-CM | POA: Diagnosis not present

## 2016-12-13 ENCOUNTER — Encounter (INDEPENDENT_AMBULATORY_CARE_PROVIDER_SITE_OTHER): Payer: Self-pay

## 2016-12-13 ENCOUNTER — Encounter: Payer: Self-pay | Admitting: Primary Care

## 2016-12-13 ENCOUNTER — Ambulatory Visit: Payer: Medicare HMO

## 2016-12-13 ENCOUNTER — Ambulatory Visit (INDEPENDENT_AMBULATORY_CARE_PROVIDER_SITE_OTHER): Payer: Medicare HMO | Admitting: Primary Care

## 2016-12-13 VITALS — BP 146/98 | HR 104 | Temp 98.4°F | Ht 66.0 in | Wt 229.1 lb

## 2016-12-13 DIAGNOSIS — F3342 Major depressive disorder, recurrent, in full remission: Secondary | ICD-10-CM

## 2016-12-13 DIAGNOSIS — E349 Endocrine disorder, unspecified: Secondary | ICD-10-CM | POA: Diagnosis not present

## 2016-12-13 DIAGNOSIS — A6000 Herpesviral infection of urogenital system, unspecified: Secondary | ICD-10-CM | POA: Diagnosis not present

## 2016-12-13 DIAGNOSIS — N189 Chronic kidney disease, unspecified: Secondary | ICD-10-CM

## 2016-12-13 DIAGNOSIS — R69 Illness, unspecified: Secondary | ICD-10-CM | POA: Diagnosis not present

## 2016-12-13 DIAGNOSIS — N529 Male erectile dysfunction, unspecified: Secondary | ICD-10-CM | POA: Diagnosis not present

## 2016-12-13 DIAGNOSIS — Z0000 Encounter for general adult medical examination without abnormal findings: Secondary | ICD-10-CM | POA: Insufficient documentation

## 2016-12-13 DIAGNOSIS — I10 Essential (primary) hypertension: Secondary | ICD-10-CM | POA: Diagnosis not present

## 2016-12-13 DIAGNOSIS — M1A9XX0 Chronic gout, unspecified, without tophus (tophi): Secondary | ICD-10-CM | POA: Diagnosis not present

## 2016-12-13 DIAGNOSIS — Z1211 Encounter for screening for malignant neoplasm of colon: Secondary | ICD-10-CM | POA: Diagnosis not present

## 2016-12-13 DIAGNOSIS — F329 Major depressive disorder, single episode, unspecified: Secondary | ICD-10-CM | POA: Insufficient documentation

## 2016-12-13 DIAGNOSIS — E785 Hyperlipidemia, unspecified: Secondary | ICD-10-CM | POA: Diagnosis not present

## 2016-12-13 DIAGNOSIS — Z0001 Encounter for general adult medical examination with abnormal findings: Secondary | ICD-10-CM | POA: Diagnosis not present

## 2016-12-13 DIAGNOSIS — Z125 Encounter for screening for malignant neoplasm of prostate: Secondary | ICD-10-CM

## 2016-12-13 LAB — COMPREHENSIVE METABOLIC PANEL
ALBUMIN: 4.5 g/dL (ref 3.5–5.2)
ALK PHOS: 94 U/L (ref 39–117)
ALT: 35 U/L (ref 0–53)
AST: 39 U/L — AB (ref 0–37)
BILIRUBIN TOTAL: 0.8 mg/dL (ref 0.2–1.2)
BUN: 26 mg/dL — AB (ref 6–23)
CO2: 27 mEq/L (ref 19–32)
CREATININE: 1.59 mg/dL — AB (ref 0.40–1.50)
Calcium: 9.6 mg/dL (ref 8.4–10.5)
Chloride: 98 mEq/L (ref 96–112)
GFR: 46.29 mL/min — ABNORMAL LOW (ref >60.00)
Glucose, Bld: 94 mg/dL (ref 70–99)
Potassium: 3.9 mEq/L (ref 3.5–5.1)
SODIUM: 140 meq/L (ref 135–145)
Total Protein: 7.7 g/dL (ref 6.0–8.3)

## 2016-12-13 LAB — LIPID PANEL
Cholesterol: 305 mg/dL — ABNORMAL HIGH (ref 0–200)
HDL: 192.7 mg/dL (ref >39.00)
LDL Cholesterol: 95 mg/dL (ref 0–99)
NONHDL: 112.13
Total CHOL/HDL Ratio: 2
Triglycerides: 88 mg/dL (ref 0.0–149.0)
VLDL: 17.6 mg/dL (ref 0.0–40.0)

## 2016-12-13 LAB — HEMOGLOBIN A1C: Hgb A1c MFr Bld: 5.5 % (ref 4.6–6.5)

## 2016-12-13 LAB — PSA: PSA: 0.64 ng/mL (ref 0.10–4.00)

## 2016-12-13 MED ORDER — SILDENAFIL CITRATE 100 MG PO TABS: ORAL_TABLET | ORAL | 3 refills | Status: DC

## 2016-12-13 NOTE — Progress Notes (Signed)
Subjective:    Patient ID: Devin Howell, male    DOB: 06-24-1950, 66 y.o.   MRN: [id number]  HPI  Devin Howell is a 66 year old male who presents today for complete physical and discuss the problems mentioned below.  Immunizations: -Tetanus: Believes it's been 10 years. -Influenza: Completed in September 2018 -Pneumonia: Completed Prevnar in 2016. Due for pneumovax. -Shingles: Completed Shingrix in 2018, due for second dose soon.  Diet: He endorses a healthy diet. Breakfast: Eggs, bacon/sausage, potatoes, bread Lunch: early dinner. Dinner: Patent examiner, meat (shimp, steak, chicken), vegetable, pasta   Snacks: None Desserts: Small amount nightly Beverages: Some coffee, water (4 eight ounce glasses), diet Coke. Daily alcohol consumption.  Exercise: No regular exercise.  Eye exam: Completed in 2017. Dental exam: Completes four times annually. Colonoscopy: Completed 8-9 years ago. Due.   PSA: Completed in 2016  1) Depression: Symptoms of crying, sadness, anxiety that began in April 2018 after the passing of his younger sister. Shortly after that time he went and saw a therapist. The therapist diagnosed his sister with a schizhoid disorder, was told that he did not have this. He's been seeing a psychiatrist and therapist since. He was placed on trazodone 50 mg, Cymbalta 30 mg. Since medication initiation he's felt much improved. He will see his psychiatrist tomorrow.  2) Erectile Dysfunction: Long history of this for years. Currently managed on sildenafil. Current symptoms include no erection and/or inability to maintain erection. Previously managed on Viagra and Cialis, believes he did well on both. He will take 10 tablets of the sildenafil and will get an erection but cannot maintain. He is interested in trying something else.   Review of Systems  Constitutional: Negative for unexpected weight change.  HENT: Negative for rhinorrhea.   Respiratory: Negative for cough and  shortness of breath.   Cardiovascular: Negative for chest pain.  Gastrointestinal: Negative for constipation and diarrhea.  Genitourinary: Negative for difficulty urinating.       Erectile dysfunction. Has not followed with urology for testosterone management.  Musculoskeletal: Negative for arthralgias and myalgias.  Skin: Negative for rash.  Allergic/Immunologic: Negative for environmental allergies.  Neurological: Negative for dizziness, numbness and headaches.  Psychiatric/Behavioral:       Much improved on Cymbalta and trazodone       Past Medical History:  Diagnosis Date  . Chronic gout   . CKD (chronic kidney disease)   . Erectile dysfunction   . GERD (gastroesophageal reflux disease)   . Herpes simplex   . Hyperlipidemia   . Hypertension   . OSA on CPAP   . Testosterone deficiency      Social History   Social History  . Marital status: Married    Spouse name: N/A  . Number of children: N/A  . Years of education: N/A   Occupational History  . Not on file.   Social History Main Topics  . Smoking status: Never Smoker  . Smokeless tobacco: Never Used  . Alcohol use Yes  . Drug use: No  . Sexual activity: Not on file   Other Topics Concern  . Not on file   Social History Narrative  . No narrative on file    Past Surgical History:  Procedure Laterality Date  . TONSILLECTOMY AND ADENOIDECTOMY  1960    No family history on file.  No Known Allergies  Current Outpatient Prescriptions on File Prior to Visit  Medication Sig Dispense Refill  . albuterol (PROVENTIL HFA;VENTOLIN HFA) 108 (90 Base)  MCG/ACT inhaler Inhale 2 puffs into the lungs every 4-6 hours as needed for shortness of breath. 1 Inhaler 0  . allopurinol (ZYLOPRIM) 300 MG tablet Take 1 tablet (300 mg total) by mouth daily. 90 tablet 3  . amLODipine (NORVASC) 5 MG tablet TAKE 1 TABLET BY MOUTH DAILY 90 tablet 1  . atorvastatin (LIPITOR) 20 MG tablet Take 20 mg by mouth daily.    . clobetasol  ointment (TEMOVATE) 0.96 % Apply 1 application topically as needed (for ecsema).     . furosemide (LASIX) 40 MG tablet Take 1 tablet (40 mg total) by mouth daily. 90 tablet 1  . pantoprazole (PROTONIX) 20 MG tablet Take 1 tablet (20 mg total) by mouth daily. 90 tablet 1  . testosterone cypionate (DEPOTESTOTERONE CYPIONATE) 100 MG/ML injection Inject 200 mg into the muscle every 14 (fourteen) days. For IM use only    . valACYclovir (VALTREX) 1000 MG tablet Take 1 tablet by mouth twice daily for 3-5 days as needed for outbreaks. 15 tablet 5   No current facility-administered medications on file prior to visit.     BP (!) 146/98   Pulse (!) 104   Temp 98.4 F (36.9 C) (Oral)   Ht 5\' 6"  (1.676 m)   Wt 229 lb 1.9 oz (103.9 kg)   SpO2 96%   BMI 36.98 kg/m    Objective:   Physical Exam  Constitutional: He is oriented to person, place, and time. He appears well-nourished.  HENT:  Right Ear: Tympanic membrane and ear canal normal.  Left Ear: Tympanic membrane and ear canal normal.  Nose: Nose normal. Right sinus exhibits no maxillary sinus tenderness and no frontal sinus tenderness. Left sinus exhibits no maxillary sinus tenderness and no frontal sinus tenderness.  Mouth/Throat: Oropharynx is clear and moist.  Eyes: Pupils are equal, round, and reactive to light. Conjunctivae and EOM are normal.  Neck: Neck supple. Carotid bruit is not present. No thyromegaly present.  Cardiovascular: Normal rate, regular rhythm and normal heart sounds.   Pulmonary/Chest: Effort normal and breath sounds normal. He has no wheezes. He has no rales.  Abdominal: Soft. Bowel sounds are normal. There is no tenderness.  Musculoskeletal: Normal range of motion.  Neurological: He is alert and oriented to person, place, and time. He has normal reflexes. No cranial nerve deficit.  Skin: Skin is warm and dry.  Psychiatric: He has a normal mood and affect.          Assessment & Plan:

## 2016-12-13 NOTE — Assessment & Plan Note (Signed)
We'll have him start monitoring home blood pressure levels. Currently under a lot of stress at work, will be retiring soon.

## 2016-12-13 NOTE — Patient Instructions (Signed)
Complete lab work prior to leaving today. I will notify you of your results once received.   You will be contacted regarding your referral to GI for the colonoscopy.  Please let us know if you have not heard back within one week.   Start exercising. You should be getting 150 minutes of moderate intensity exercise weekly.  Ensure you are consuming 64 ounces of water daily.  I sent Viagra 100 mg tablets to your pharmacy. Take 1/2 to 1 tablet by mouth 30 minutes prior to intercourse. Please update me in a few weeks if this is ineffective.  Schedule an appointment with your Urologist as discussed.  Monitor your blood pressure, it should run less than 140/90.  Follow up in 1 year for your annual exam or sooner if needed.  It was a pleasure to see you today!

## 2016-12-13 NOTE — Assessment & Plan Note (Signed)
Encouraged him to schedule follow-up visit with urology for retesting of testosterone levels given increased difficulty erections.

## 2016-12-13 NOTE — Assessment & Plan Note (Signed)
Following with nephrology annually. Endorses stable creatinine levels.

## 2016-12-13 NOTE — Assessment & Plan Note (Signed)
Lipid panel pending today. Discussed the importance of a healthy diet and regular exercise in order for weight loss, and to reduce the risk of other medical problems.

## 2016-12-13 NOTE — Assessment & Plan Note (Signed)
Increased difficulty over the last 6 months. Could be related to testosterone levels, will have him see urology for follow-up. Prescription for brand Viagra with coupon provided today as he's historically done well on this regimen in the past. He will update if no improvement, consider daily Cialis at that point.

## 2016-12-13 NOTE — Assessment & Plan Note (Signed)
Tetanus and Pneumovax due today, due to recent weather conditions vaccinations are not available in the office. He will return within the next week to have these administered. PSA pending. Colonoscopy due, referral placed. Discussed the importance of a healthy diet and regular exercise in order for weight loss, and to reduce the risk of other medical problems. Exam today unremarkable. Labs pending. Follow-up in one year for reevaluation.

## 2016-12-13 NOTE — Assessment & Plan Note (Signed)
Infrequent outbreaks. Continue Valtrex as needed.

## 2016-12-13 NOTE — Assessment & Plan Note (Signed)
Following with psychiatry and therapy. Doing very well on Cymbalta and trazodone, continue same.

## 2016-12-13 NOTE — Assessment & Plan Note (Signed)
No flares. Continue allopurinol once daily.

## 2016-12-14 DIAGNOSIS — R69 Illness, unspecified: Secondary | ICD-10-CM | POA: Diagnosis not present

## 2016-12-15 ENCOUNTER — Telehealth: Payer: Self-pay | Admitting: Primary Care

## 2016-12-15 NOTE — Telephone Encounter (Signed)
Left message asking pt to call office regard referral

## 2016-12-16 ENCOUNTER — Ambulatory Visit: Payer: Medicare HMO

## 2016-12-16 ENCOUNTER — Ambulatory Visit (INDEPENDENT_AMBULATORY_CARE_PROVIDER_SITE_OTHER): Payer: Medicare HMO

## 2016-12-16 VITALS — BP 142/90 | HR 99 | Temp 98.5°F | Ht 66.0 in | Wt 226.5 lb

## 2016-12-16 DIAGNOSIS — Z23 Encounter for immunization: Secondary | ICD-10-CM | POA: Diagnosis not present

## 2016-12-16 DIAGNOSIS — Z0000 Encounter for general adult medical examination without abnormal findings: Secondary | ICD-10-CM

## 2016-12-16 DIAGNOSIS — Z1159 Encounter for screening for other viral diseases: Secondary | ICD-10-CM

## 2016-12-16 NOTE — Progress Notes (Signed)
I reviewed health advisor's note, was available for consultation, and agree with documentation and plan.  

## 2016-12-16 NOTE — Progress Notes (Signed)
PCP notes:   Health maintenance:  Tetanus vaccine - administered PPSV23 - administered Hep C screening - completed Colonoscopy - addressed; planning to schedule appt  Abnormal screenings:   None  Patient concerns:   None  Nurse concerns:  None  Next PCP appt:   N/A; CPE on 12/13/16

## 2016-12-16 NOTE — Progress Notes (Signed)
Pre visit review using our clinic review tool, if applicable. No additional management support is needed unless otherwise documented below in the visit note. 

## 2016-12-16 NOTE — Patient Instructions (Signed)
Devin Howell , Thank you for taking time to come for your Medicare Wellness Visit. I appreciate your ongoing commitment to your health goals. Please review the following plan we discussed and let me know if I can assist you in the future.   These are the goals we discussed: Goals    . Increase physical activity          Starting 12/16/2016, I will continue to walk at least 5 miles 4 days per week.        This is a list of the screening recommended for you and due dates:  Health Maintenance  Topic Date Due  . Colon Cancer Screening  12/17/2026*  . Tetanus Vaccine  12/17/2026  . Flu Shot  Completed  .  Hepatitis C: One time screening is recommended by Center for Disease Control  (CDC) for  adults born from [birth year] through 1966.   Completed  . Pneumonia vaccines  Completed  *Topic was postponed. The date shown is not the original due date.   Preventive Care for Adults  A healthy lifestyle and preventive care can promote health and wellness. Preventive health guidelines for adults include the following key practices.  . A routine yearly physical is a good way to check with your health care provider about your health and preventive screening. It is a chance to share any concerns and updates on your health and to receive a thorough exam.  . Visit your dentist for a routine exam and preventive care every 6 months. Brush your teeth twice a day and floss once a day. Good oral hygiene prevents tooth decay and gum disease.  . The frequency of eye exams is based on your age, health, family medical history, use  of contact lenses, and other factors. Follow your health care provider's ecommendations for frequency of eye exams.  . Eat a healthy diet. Foods like vegetables, fruits, whole grains, low-fat dairy products, and lean protein foods contain the nutrients you need without too many calories. Decrease your intake of foods high in solid fats, added sugars, and salt. Eat the right amount of  calories for you. Get information about a proper diet from your health care provider, if necessary.  . Regular physical exercise is one of the most important things you can do for your health. Most adults should get at least 150 minutes of moderate-intensity exercise (any activity that increases your heart rate and causes you to sweat) each week. In addition, most adults need muscle-strengthening exercises on 2 or more days a week.  Silver Sneakers may be a benefit available to you. To determine eligibility, you may visit the website: www.silversneakers.com or contact program at 2054933551 Mon-Fri between 8AM-8PM.   . Maintain a healthy weight. The body mass index (BMI) is a screening tool to identify possible weight problems. It provides an estimate of body fat based on height and weight. Your health care provider can find your BMI and can help you achieve or maintain a healthy weight.   For adults 20 years and older: ? A BMI below 18.5 is considered underweight. ? A BMI of 18.5 to 24.9 is normal. ? A BMI of 25 to 29.9 is considered overweight. ? A BMI of 30 and above is considered obese.   . Maintain normal blood lipids and cholesterol levels by exercising and minimizing your intake of saturated fat. Eat a balanced diet with plenty of fruit and vegetables. Blood tests for lipids and cholesterol should begin at age 15 and  be repeated every 5 years. If your lipid or cholesterol levels are high, you are over 50, or you are at high risk for heart disease, you may need your cholesterol levels checked more frequently. Ongoing high lipid and cholesterol levels should be treated with medicines if diet and exercise are not working.  . If you smoke, find out from your health care provider how to quit. If you do not use tobacco, please do not start.  . If you choose to drink alcohol, please do not consume more than 2 drinks per day. One drink is considered to be 12 ounces (355 mL) of beer, 5 ounces  (148 mL) of wine, or 1.5 ounces (44 mL) of liquor.  . If you are 66-54 years old, ask your health care provider if you should take aspirin to prevent strokes.  . Use sunscreen. Apply sunscreen liberally and repeatedly throughout the day. You should seek shade when your shadow is shorter than you. Protect yourself by wearing long sleeves, pants, a wide-brimmed hat, and sunglasses year round, whenever you are outdoors.  . Once a month, do a whole body skin exam, using a mirror to look at the skin on your back. Tell your health care provider of new moles, moles that have irregular borders, moles that are larger than a pencil eraser, or moles that have changed in shape or color.

## 2016-12-16 NOTE — Progress Notes (Signed)
Subjective:   Devin Howell is a 66 y.o. male who presents for an Initial Medicare Annual Wellness Visit.  Review of Systems  N/A Cardiac Risk Factors include: advanced age (>13men, >47 women);male gender;dyslipidemia;hypertension;obesity (BMI >30kg/m2)    Objective:    Today's Vitals   12/16/16 1218  BP: (!) 142/90  Pulse: 99  Temp: 98.5 F (36.9 C)  TempSrc: Oral  SpO2: 94%  Weight: 226 lb 8 oz (102.7 kg)  Height: 5\' 6"  (1.676 m)  PainSc: 0-No pain   Body mass index is 36.56 kg/m.  Current Medications (verified) Outpatient Encounter Prescriptions as of 12/16/2016  Medication Sig  . albuterol (PROVENTIL HFA;VENTOLIN HFA) 108 (90 Base) MCG/ACT inhaler Inhale 2 puffs into the lungs every 4-6 hours as needed for shortness of breath.  . allopurinol (ZYLOPRIM) 300 MG tablet Take 1 tablet (300 mg total) by mouth daily.  Marland Kitchen amLODipine (NORVASC) 5 MG tablet TAKE 1 TABLET BY MOUTH DAILY  . atorvastatin (LIPITOR) 20 MG tablet Take 20 mg by mouth daily.  . clobetasol ointment (TEMOVATE) 5.63 % Apply 1 application topically as needed (for ecsema).   . DULoxetine (CYMBALTA) 30 MG capsule Take 30 mg by mouth daily.   . furosemide (LASIX) 40 MG tablet Take 1 tablet (40 mg total) by mouth daily.  . pantoprazole (PROTONIX) 20 MG tablet Take 1 tablet (20 mg total) by mouth daily.  . sildenafil (VIAGRA) 100 MG tablet Take 1/2-1 tablet by mouth 30 minutes prior to intercourse.  . testosterone cypionate (DEPOTESTOTERONE CYPIONATE) 100 MG/ML injection Inject 200 mg into the muscle every 14 (fourteen) days. For IM use only  . traZODone (DESYREL) 50 MG tablet Take 50 mg by mouth at bedtime as needed.   . valACYclovir (VALTREX) 1000 MG tablet Take 1 tablet by mouth twice daily for 3-5 days as needed for outbreaks.   No facility-administered encounter medications on file as of 12/16/2016.     Allergies (verified) Patient has no known allergies.   History: Past Medical History:  Diagnosis  Date  . Chronic gout   . CKD (chronic kidney disease)   . Erectile dysfunction   . GERD (gastroesophageal reflux disease)   . Herpes simplex   . Hyperlipidemia   . Hypertension   . OSA on CPAP   . Testosterone deficiency    Past Surgical History:  Procedure Laterality Date  . TONSILLECTOMY AND ADENOIDECTOMY  1960   History reviewed. No pertinent family history. Social History   Occupational History  . Not on file.   Social History Main Topics  . Smoking status: Never Smoker  . Smokeless tobacco: Never Used  . Alcohol use Yes  . Drug use: No  . Sexual activity: Not on file   Tobacco Counseling Counseling given: No   Activities of Daily Living In your present state of health, do you have any difficulty performing the following activities: 12/16/2016  Hearing? Y  Vision? N  Difficulty concentrating or making decisions? N  Walking or climbing stairs? N  Dressing or bathing? N  Doing errands, shopping? N  Preparing Food and eating ? N  Using the Toilet? N  In the past six months, have you accidently leaked urine? N  Do you have problems with loss of bowel control? N  Managing your Medications? N  Managing your Finances? N  Housekeeping or managing your Housekeeping? N  Some recent data might be hidden    Immunizations and Health Maintenance Immunization History  Administered Date(s) Administered  .  Influenza, High Dose Seasonal PF 12/08/2016  . Pneumococcal Conjugate-13 02/12/2015  . Pneumococcal Polysaccharide-23 12/16/2016  . Td 12/16/2016  . Zoster Recombinat (Shingrix) 10/14/2016   There are no preventive care reminders to display for this patient.  Patient Care Team: Pleas Koch, NP as PCP - General (Internal Medicine)  Indicate any recent Medical Services you may have received from other than [facility] providers in the past year (date may be approximate).    Assessment:   This is a routine wellness examination for Devin Howell.  Hearing/Vision screen   Visual Acuity Screening   Right eye Left eye Both eyes  Without correction: 20/25-1 20/25-1 20/25-1  With correction:     Hearing Screening Comments: Bilateral hearing aids  Dietary issues and exercise activities discussed: Current Exercise Habits: Home exercise routine, Type of exercise: walking, Frequency (Times/Week): 4, Intensity: Moderate, Exercise limited by: None identified  Goals    . Increase physical activity          Starting 12/16/2016, I will continue to walk at least 5 miles 4 days per week.       Depression Screen PHQ 2/9 Scores 12/16/2016  PHQ - 2 Score 0  PHQ- 9 Score 0    Fall Risk Fall Risk  12/16/2016  Falls in the past year? No    Cognitive Function: MMSE - Mini Mental State Exam 12/16/2016  Orientation to time 5  Orientation to Place 5  Registration 3  Attention/ Calculation 0  Recall 3  Language- name 2 objects 0  Language- repeat 1  Language- follow 3 step command 3  Language- read & follow direction 0  Write a sentence 0  Copy design 0  Total score 20     PLEASE NOTE: A Mini-Cog screen was completed. Maximum score is 20. A value of 0 denotes this part of Folstein MMSE was not completed or the patient failed this part of the Mini-Cog screening.   Mini-Cog Screening Orientation to Time - Max 5 pts Orientation to Place - Max 5 pts Registration - Max 3 pts Recall - Max 3 pts Language Repeat - Max 1 pts Language Follow 3 Step Command - Max 3 pts     Screening Tests Health Maintenance  Topic Date Due  . COLONOSCOPY  12/17/2026 (Originally 04/10/1999)  . TETANUS/TDAP  12/17/2026  . INFLUENZA VACCINE  Completed  . Hepatitis C Screening  Completed  . PNA vac Low Risk Adult  Completed        Plan:     I have personally reviewed and addressed the Medicare Annual Wellness questionnaire and have noted the following in the patient's chart:  A. Medical and social history B. Use of alcohol, tobacco or illicit drugs  C. Current medications and  supplements D. Functional ability and status E.  Nutritional status F.  Physical activity G. Advance directives H. List of other physicians I.  Hospitalizations, surgeries, and ER visits in previous 12 months J.  [place] to include hearing, vision, cognitive, depression L. Referrals and appointments - none  In addition, I have reviewed and discussed with patient certain preventive protocols, quality metrics, and best practice recommendations. A written personalized care plan for preventive services as well as general preventive health recommendations were provided to patient.  See attached scanned questionnaire for additional information.   Signed,   Lindell Noe, MHA, BS, LPN Health Coach

## 2016-12-17 LAB — HEPATITIS C ANTIBODY
HEP C AB: NONREACTIVE
SIGNAL TO CUT-OFF: 0.09 (ref <1.00)

## 2017-01-11 DIAGNOSIS — H35363 Drusen (degenerative) of macula, bilateral: Secondary | ICD-10-CM | POA: Diagnosis not present

## 2017-01-12 ENCOUNTER — Other Ambulatory Visit: Payer: Self-pay | Admitting: Primary Care

## 2017-01-13 ENCOUNTER — Other Ambulatory Visit: Payer: Self-pay | Admitting: Primary Care

## 2017-01-13 DIAGNOSIS — R066 Hiccough: Secondary | ICD-10-CM

## 2017-01-13 DIAGNOSIS — G4733 Obstructive sleep apnea (adult) (pediatric): Secondary | ICD-10-CM | POA: Diagnosis not present

## 2017-01-13 DIAGNOSIS — I10 Essential (primary) hypertension: Secondary | ICD-10-CM

## 2017-01-13 MED ORDER — METOCLOPRAMIDE HCL 10 MG PO TABS: ORAL_TABLET | ORAL | 0 refills | Status: DC

## 2017-01-16 ENCOUNTER — Other Ambulatory Visit: Payer: Self-pay | Admitting: Primary Care

## 2017-01-16 DIAGNOSIS — R066 Hiccough: Secondary | ICD-10-CM

## 2017-01-16 DIAGNOSIS — R0602 Shortness of breath: Secondary | ICD-10-CM

## 2017-01-16 MED ORDER — ALBUTEROL SULFATE HFA 108 (90 BASE) MCG/ACT IN AERS: INHALATION_SPRAY | RESPIRATORY_TRACT | 0 refills | Status: DC

## 2017-01-16 MED ORDER — BACLOFEN 10 MG PO TABS: ORAL_TABLET | ORAL | 0 refills | Status: DC

## 2017-01-17 ENCOUNTER — Encounter: Payer: Self-pay | Admitting: Primary Care

## 2017-01-17 DIAGNOSIS — R69 Illness, unspecified: Secondary | ICD-10-CM | POA: Diagnosis not present

## 2017-01-17 DIAGNOSIS — F411 Generalized anxiety disorder: Secondary | ICD-10-CM | POA: Diagnosis not present

## 2017-01-17 MED ORDER — ALBUTEROL SULFATE HFA 108 (90 BASE) MCG/ACT IN AERS: INHALATION_SPRAY | RESPIRATORY_TRACT | 0 refills | Status: DC

## 2017-01-17 NOTE — Addendum Note (Signed)
Addended by: Jacqualin Combes on: 01/17/2017 12:03 PM   Modules accepted: Orders

## 2017-02-21 DIAGNOSIS — R69 Illness, unspecified: Secondary | ICD-10-CM | POA: Diagnosis not present

## 2017-02-22 ENCOUNTER — Encounter: Payer: Self-pay | Admitting: Internal Medicine

## 2017-02-28 DIAGNOSIS — H25013 Cortical age-related cataract, bilateral: Secondary | ICD-10-CM | POA: Diagnosis not present

## 2017-02-28 DIAGNOSIS — H2511 Age-related nuclear cataract, right eye: Secondary | ICD-10-CM | POA: Diagnosis not present

## 2017-02-28 DIAGNOSIS — H25043 Posterior subcapsular polar age-related cataract, bilateral: Secondary | ICD-10-CM | POA: Diagnosis not present

## 2017-02-28 DIAGNOSIS — H2513 Age-related nuclear cataract, bilateral: Secondary | ICD-10-CM | POA: Diagnosis not present

## 2017-02-28 DIAGNOSIS — I10 Essential (primary) hypertension: Secondary | ICD-10-CM | POA: Diagnosis not present

## 2017-03-01 ENCOUNTER — Encounter: Payer: Self-pay | Admitting: Internal Medicine

## 2017-03-01 ENCOUNTER — Ambulatory Visit (INDEPENDENT_AMBULATORY_CARE_PROVIDER_SITE_OTHER): Payer: Medicare HMO | Admitting: Internal Medicine

## 2017-03-01 VITALS — BP 152/84 | HR 91 | Temp 98.9°F | Resp 16 | Wt 229.0 lb

## 2017-03-01 DIAGNOSIS — F419 Anxiety disorder, unspecified: Secondary | ICD-10-CM | POA: Diagnosis not present

## 2017-03-01 DIAGNOSIS — F32A Depression, unspecified: Secondary | ICD-10-CM | POA: Insufficient documentation

## 2017-03-01 DIAGNOSIS — R112 Nausea with vomiting, unspecified: Secondary | ICD-10-CM | POA: Insufficient documentation

## 2017-03-01 DIAGNOSIS — F329 Major depressive disorder, single episode, unspecified: Secondary | ICD-10-CM | POA: Diagnosis not present

## 2017-03-01 DIAGNOSIS — R69 Illness, unspecified: Secondary | ICD-10-CM | POA: Diagnosis not present

## 2017-03-01 MED ORDER — ONDANSETRON 8 MG PO TBDP: 8.0000 mg | ORAL_TABLET | Freq: Three times a day (TID) | ORAL | 0 refills | Status: DC | PRN

## 2017-03-01 NOTE — Progress Notes (Signed)
Subjective:    Patient ID: Devin Howell, male    DOB: 09-28-1950, 66 y.o.   MRN: [id number]  HPI He is here for an acute visit.   Nausea, vomiting: His symptoms started yesterday.  He has been nauseous and has been vomiting every hour on the hour for the most part.  He denies any fever, diarrhea, abdominal pain or blood in the stool.  He denies cold symptoms.  He does have a knot in his lower abdomen that is not painful.  He denies sick contacts.  He has been able to keep his medication down.  He has drank a little-but it does not always stay down.  He is not able to keep any food down.  He knows this is all stress related.  Since April he has been under intense stress for multiple reasons.  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  There is no question in his mind that this is all stress related.  He has started seeing a psychiatrist and he is taking his medication.  The medication has helped.  He denies any specific event that started all of his symptoms yesterday.  He just wants to get through it.  He is having difficulty sleeping, primarily because of dry heaves.  Needing to vomit keeps waking him up.    He did have a similar episode a few months ago and he was not evaluated.  It passed on its own.   Medications and allergies reviewed with patient and updated if appropriate.  Patient Active Problem List   Diagnosis Date Noted  . Erectile dysfunction 12/13/2016  . Preventative health care 12/13/2016  . Major depressive disorder 12/13/2016  . Hiccups 06/10/2016  . Chronic gout without tophus 04/20/2016  . Genital herpes simplex 04/20/2016  . Testosterone deficiency 04/20/2016  . CKD (chronic kidney disease) 04/20/2016  . Essential hypertension 04/20/2016  . Hyperlipidemia 04/20/2016    Current Outpatient Medications on File Prior to Visit  Medication Sig Dispense Refill  . albuterol  (VENTOLIN HFA) 108 (90 Base) MCG/ACT inhaler Inhale 2 puffs into the lungs every 4-6 hours as needed for shortness of breath. 18 g 0  . allopurinol (ZYLOPRIM) 300 MG tablet Take 1 tablet (300 mg total) by mouth daily. 90 tablet 3  . amLODipine (NORVASC) 5 MG tablet TAKE 1 TABLET BY MOUTH DAILY 90 tablet 1  . atorvastatin (LIPITOR) 20 MG tablet Take 20 mg by mouth daily.    . baclofen (LIORESAL) 10 MG tablet Take 1 tablet by mouth three times daily as needed for hiccups. 15 each 0  . clobetasol ointment (TEMOVATE) 5.63 % Apply 1 application topically as needed (for ecsema).     . clonazePAM (KLONOPIN) 0.5 MG tablet Take 0.5 mg by mouth 2 (two) times daily as needed for anxiety.    . DULoxetine HCl 40 MG CPEP Take 40 mg by mouth daily.    . furosemide (LASIX) 40 MG tablet Take 1 tablet (40 mg total) by mouth daily. 90 tablet 1  . pantoprazole (PROTONIX) 20 MG tablet Take 1 tablet (20 mg total) by mouth daily. 90 tablet 1  . sildenafil (VIAGRA) 100 MG tablet Take 1/2-1 tablet by mouth 30 minutes prior to intercourse. 6 tablet 3  . testosterone cypionate (DEPOTESTOTERONE CYPIONATE) 100 MG/ML injection Inject 200 mg into the muscle every 14 (fourteen) days. For IM use only    .  traZODone (DESYREL) 50 MG tablet Take 50 mg by mouth at bedtime as needed.     . valACYclovir (VALTREX) 1000 MG tablet Take 1 tablet by mouth twice daily for 3-5 days as needed for outbreaks. 15 tablet 5   No current facility-administered medications on file prior to visit.     Past Medical History:  Diagnosis Date  . Chronic gout   . CKD (chronic kidney disease)   . Erectile dysfunction   . GERD (gastroesophageal reflux disease)   . Herpes simplex   . Hyperlipidemia   . Hypertension   . OSA on CPAP   . Testosterone deficiency     Past Surgical History:  Procedure Laterality Date  . TONSILLECTOMY AND ADENOIDECTOMY  1960    Social History   Socioeconomic History  . Marital status: Married    Spouse name: None   . Number of children: None  . Years of education: None  . Highest education level: None  Social Needs  . Financial resource strain: None  . Food insecurity - worry: None  . Food insecurity - inability: None  . Transportation needs - medical: None  . Transportation needs - non-medical: None  Occupational History  . None  Tobacco Use  . Smoking status: Never Smoker  . Smokeless tobacco: Never Used  Substance and Sexual Activity  . Alcohol use: Yes  . Drug use: No  . Sexual activity: None  Other Topics Concern  . None  Social History Narrative  . None    No family history on file.  Review of Systems  Constitutional: Negative for chills and fever.  Respiratory: Negative for shortness of breath.   Cardiovascular: Negative for chest pain and palpitations.  Gastrointestinal: Positive for nausea and vomiting. Negative for abdominal pain (knot in lower abdomen), constipation and diarrhea.  Neurological: Positive for light-headedness. Negative for headaches.       Objective:   Vitals:   03/01/17 1130  BP: (!) 152/84  Pulse: 91  Resp: 16  Temp: 98.9 F (37.2 C)  SpO2: 97%   Wt Readings from Last 3 Encounters:  03/01/17 229 lb (103.9 kg)  12/16/16 226 lb 8 oz (102.7 kg)  12/13/16 229 lb 1.9 oz (103.9 kg)   Body mass index is 36.96 kg/m.   Physical Exam    GENERAL APPEARANCE: Appears stated age, well appearing, NAD EYES: conjunctiva clear, no icterus LUNGS: Clear to auscultation without wheeze or crackles, unlabored breathing, good air entry bilaterally CARDIOVASCULAR: Normal S1,S2 without murmurs, no edema Abdomen: soft, non tender, non distended SKIN: Warm, dry Psychiatric: Anxious mood and affect      Assessment & Plan:    See Problem List for Assessment and Plan of chronic medical problems.

## 2017-03-01 NOTE — Patient Instructions (Signed)
Start an antinausea medication - take three times a day as needed.   Call if no improvement    Call your psychiatrist and discuss medication changes.

## 2017-03-01 NOTE — Assessment & Plan Note (Signed)
He is experiencing significant anxiety and depression It is secondary to a combination of many events over the past several months He is following with a psychiatrist-he has not called him regarding this current episode and I did encourage him to call him to see if there is any acute medication changes that could be made immediately to help-I will not make any changes since the medications are being prescribed by a psychiatrist

## 2017-03-01 NOTE — Assessment & Plan Note (Signed)
Related to severe anxiety He feels if he can get through this episode he will be better Likely mild dehydration secondary to not keeping many fluids down Start Zofran 3 times daily as needed-he will call if this is not effective Advised him to push the fluids as tolerated Once able small, bland meals-increase slowly Call if no improvement

## 2017-03-15 DIAGNOSIS — H25811 Combined forms of age-related cataract, right eye: Secondary | ICD-10-CM | POA: Diagnosis not present

## 2017-03-15 DIAGNOSIS — H2511 Age-related nuclear cataract, right eye: Secondary | ICD-10-CM | POA: Diagnosis not present

## 2017-03-16 DIAGNOSIS — H2512 Age-related nuclear cataract, left eye: Secondary | ICD-10-CM | POA: Diagnosis not present

## 2017-03-25 ENCOUNTER — Other Ambulatory Visit: Payer: Self-pay

## 2017-03-25 ENCOUNTER — Ambulatory Visit (AMBULATORY_SURGERY_CENTER): Payer: Self-pay | Admitting: *Deleted

## 2017-03-25 VITALS — Ht 68.0 in | Wt 232.6 lb

## 2017-03-25 DIAGNOSIS — Z1211 Encounter for screening for malignant neoplasm of colon: Secondary | ICD-10-CM

## 2017-03-25 NOTE — Progress Notes (Signed)
No egg or soy allergy known to patient  No issues with past sedation with any surgeries  or procedures, no intubation problems  No diet pills per patient No home 02 use per patient  No blood thinners per patient  Pt denies issues with constipation  No A fib or A flutter  EMMI video sent to pt's e mail pt. decline

## 2017-04-04 DIAGNOSIS — R69 Illness, unspecified: Secondary | ICD-10-CM | POA: Diagnosis not present

## 2017-04-08 ENCOUNTER — Ambulatory Visit (AMBULATORY_SURGERY_CENTER): Payer: Medicare HMO | Admitting: Internal Medicine

## 2017-04-08 ENCOUNTER — Other Ambulatory Visit: Payer: Self-pay

## 2017-04-08 ENCOUNTER — Encounter: Payer: Self-pay | Admitting: Internal Medicine

## 2017-04-08 VITALS — BP 124/78 | HR 80 | Temp 98.7°F | Resp 18 | Ht 68.0 in | Wt 232.0 lb

## 2017-04-08 DIAGNOSIS — I10 Essential (primary) hypertension: Secondary | ICD-10-CM | POA: Diagnosis not present

## 2017-04-08 DIAGNOSIS — Z1212 Encounter for screening for malignant neoplasm of rectum: Secondary | ICD-10-CM | POA: Diagnosis not present

## 2017-04-08 DIAGNOSIS — Z1211 Encounter for screening for malignant neoplasm of colon: Secondary | ICD-10-CM | POA: Diagnosis not present

## 2017-04-08 DIAGNOSIS — D123 Benign neoplasm of transverse colon: Secondary | ICD-10-CM

## 2017-04-08 MED ORDER — SODIUM CHLORIDE 0.9 % IV SOLN: 500.0000 mL | Freq: Once | INTRAVENOUS | Status: DC

## 2017-04-08 NOTE — Progress Notes (Signed)
Pt's states no medical or surgical changes since previsit or office visit.Patient consents to observer being present for procedure.  

## 2017-04-08 NOTE — Patient Instructions (Addendum)
I found and removed one polyp that looks benign. I will let you know pathology results and when to have another routine colonoscopy by mail and/or My Chart.  I appreciate the opportunity to care for you. Gatha Mayer, MD, [facility]  *Handout given to patient on polyps*  YOU HAD AN ENDOSCOPIC PROCEDURE TODAY AT [facility]:   Refer to the procedure report that was given to you for any specific questions about what was found during the examination.  If the procedure report does not answer your questions, please call your gastroenterologist to clarify.  If you requested that your care partner not be given the details of your procedure findings, then the procedure report has been included in a sealed envelope for you to review at your convenience later.  YOU SHOULD EXPECT: Some feelings of bloating in the abdomen. Passage of more gas than usual.  Walking can help get rid of the air that was put into your GI tract during the procedure and reduce the bloating. If you had a lower endoscopy (such as a colonoscopy or flexible sigmoidoscopy) you may notice spotting of blood in your stool or on the toilet paper. If you underwent a bowel prep for your procedure, you may not have a normal bowel movement for a few days.  Please Note:  You might notice some irritation and congestion in your nose or some drainage.  This is from the oxygen used during your procedure.  There is no need for concern and it should clear up in a day or so.  SYMPTOMS TO REPORT IMMEDIATELY:   Following lower endoscopy (colonoscopy or flexible sigmoidoscopy):  Excessive amounts of blood in the stool  Significant tenderness or worsening of abdominal pains  Swelling of the abdomen that is new, acute  Fever of 100F or higher   Following upper endoscopy (EGD)  Vomiting of blood or coffee ground material  New chest pain or pain under the shoulder blades  Painful or persistently difficult swallowing  New  shortness of breath  Fever of 100F or higher  Black, tarry-looking stools  For urgent or emergent issues, a gastroenterologist can be reached at any hour by calling [phone].   DIET:  We do recommend a small meal at first, but then you may proceed to your regular diet.  Drink plenty of fluids but you should avoid alcoholic beverages for 24 hours.  ACTIVITY:  You should plan to take it easy for the rest of today and you should NOT DRIVE or use heavy machinery until tomorrow (because of the sedation medicines used during the test).    FOLLOW UP: Our staff will call the number listed on your records the next business day following your procedure to check on you and address any questions or concerns that you may have regarding the information given to you following your procedure. If we do not reach you, we will leave a message.  However, if you are feeling well and you are not experiencing any problems, there is no need to return our call.  We will assume that you have returned to your regular daily activities without incident.  If any biopsies were taken you will be contacted by phone or by letter within the next 1-3 weeks.  Please call us at [phone] if you have not heard about the biopsies in 3 weeks.    SIGNATURES/CONFIDENTIALITY: You and/or your care partner have signed paperwork which will be entered into your electronic medical record.  These signatures attest to the fact that that the information above on your After Visit Summary has been reviewed and is understood.  Full responsibility of the confidentiality of this discharge information lies with you and/or your care-partner.YOU HAD AN ENDOSCOPIC PROCEDURE TODAY AT [facility]:   Refer to the procedure report that was given to you for any specific questions about what was found during the examination.  If the procedure report does not answer your questions, please call your gastroenterologist to clarify.  If you  requested that your care partner not be given the details of your procedure findings, then the procedure report has been included in a sealed envelope for you to review at your convenience later.  YOU SHOULD EXPECT: Some feelings of bloating in the abdomen. Passage of more gas than usual.  Walking can help get rid of the air that was put into your GI tract during the procedure and reduce the bloating. If you had a lower endoscopy (such as a colonoscopy or flexible sigmoidoscopy) you may notice spotting of blood in your stool or on the toilet paper. If you underwent a bowel prep for your procedure, you may not have a normal bowel movement for a few days.  Please Note:  You might notice some irritation and congestion in your nose or some drainage.  This is from the oxygen used during your procedure.  There is no need for concern and it should clear up in a day or so.  SYMPTOMS TO REPORT IMMEDIATELY:   Following lower endoscopy (colonoscopy or flexible sigmoidoscopy):  Excessive amounts of blood in the stool  Significant tenderness or worsening of abdominal pains  Swelling of the abdomen that is new, acute  Fever of 100F or higher   For urgent or emergent issues, a gastroenterologist can be reached at any hour by calling [phone].   DIET:  We do recommend a small meal at first, but then you may proceed to your regular diet.  Drink plenty of fluids but you should avoid alcoholic beverages for 24 hours.  ACTIVITY:  You should plan to take it easy for the rest of today and you should NOT DRIVE or use heavy machinery until tomorrow (because of the sedation medicines used during the test).    FOLLOW UP: Our staff will call the number listed on your records the next business day following your procedure to check on you and address any questions or concerns that you may have regarding the information given to you following your procedure. If we do not reach you, we will leave a message.   However, if you are feeling well and you are not experiencing any problems, there is no need to return our call.  We will assume that you have returned to your regular daily activities without incident.  If any biopsies were taken you will be contacted by phone or by letter within the next 1-3 weeks.  Please call us at [phone] if you have not heard about the biopsies in 3 weeks.    SIGNATURES/CONFIDENTIALITY: You and/or your care partner have signed paperwork which will be entered into your electronic medical record.  These signatures attest to the fact that that the information above on your After Visit Summary has been reviewed and is understood.  Full responsibility of the confidentiality of this discharge information lies with you and/or your care-partner.

## 2017-04-08 NOTE — Progress Notes (Signed)
Called to room to assist during endoscopic procedure.  Patient ID and intended procedure confirmed with present staff. Received instructions for my participation in the procedure from the performing physician.  

## 2017-04-08 NOTE — Op Note (Signed)
[place] Patient Name: Devin Howell Procedure Date: 04/08/2017 2:08 PM MRN: [id number] Endoscopist: Gatha Mayer , MD Age: 67 Referring MD:  Date of Birth: [birth year] Gender: Male Account #: [id number] Procedure:                Colonoscopy Indications:              Screening for colorectal malignant neoplasm Medicines:                Propofol per Anesthesia, Monitored Anesthesia Care Procedure:                Pre-Anesthesia Assessment:                           - Prior to the procedure, a History and Physical                            was performed, and patient medications and                            allergies were reviewed. The patient's tolerance of                            previous anesthesia was also reviewed. The risks                            and benefits of the procedure and the sedation                            options and risks were discussed with the patient.                            All questions were answered, and informed consent                            was obtained. Prior Anticoagulants: The patient has                            taken no previous anticoagulant or antiplatelet                            agents. ASA Grade Assessment: II - A patient with                            mild systemic disease. After reviewing the risks                            and benefits, the patient was deemed in                            satisfactory condition to undergo the procedure.                           After obtaining informed consent, the colonoscope  was passed under direct vision. Throughout the                            procedure, the patient's blood pressure, pulse, and                            oxygen saturations were monitored continuously. The                            Colonoscope was introduced through the anus and                            advanced to the the cecum, identified by   appendiceal orifice and ileocecal valve. The                            colonoscopy was performed without difficulty. The                            patient tolerated the procedure well. The quality                            of the bowel preparation was good. The ileocecal                            valve, appendiceal orifice, and rectum were                            photographed. The bowel preparation used was                            Miralax. Scope In: 2:15:22 PM Scope Out: 2:28:40 PM Scope Withdrawal Time: 0 hours 10 minutes 54 seconds  Total Procedure Duration: 0 hours 13 minutes 18 seconds  Findings:                 The perianal and digital rectal examinations were                            normal. Pertinent negatives include normal prostate                            (size, shape, and consistency).                           A 8 mm polyp was found in the transverse colon. The                            polyp was semi-pedunculated. The polyp was removed                            with a hot snare. Resection and retrieval were                            complete. Verification of patient identification  for the specimen was done. Estimated blood loss:                            none.                           The exam was otherwise without abnormality on                            direct and retroflexion views. Complications:            No immediate complications. Estimated Blood Loss:     Estimated blood loss: none. Impression:               - One 8 mm polyp in the transverse colon, removed                            with a hot snare. Resected and retrieved.                           - The examination was otherwise normal on direct                            and retroflexion views. Recommendation:           - Patient has a contact number available for                            emergencies. The signs and symptoms of potential                             delayed complications were discussed with the                            patient. Return to normal activities tomorrow.                            Written discharge instructions were provided to the                            patient.                           - Resume previous diet.                           - Continue present medications.                           - No aspirin, ibuprofen, naproxen, or other                            non-steroidal anti-inflammatory drugs for 2 weeks                            after polyp removal.                           -  Repeat colonoscopy is recommended. The                            colonoscopy date will be determined after pathology                            results from today's exam become available for                            review. Gatha Mayer, MD 04/08/2017 2:36:36 PM This report has been signed electronically.

## 2017-04-08 NOTE — Progress Notes (Signed)
To recovery, report to RN, VSS. 

## 2017-04-11 ENCOUNTER — Telehealth: Payer: Self-pay | Admitting: *Deleted

## 2017-04-11 ENCOUNTER — Telehealth: Payer: Self-pay

## 2017-04-11 NOTE — Telephone Encounter (Signed)
  Follow up Call-  Call back number 04/08/2017  Post procedure Call Back phone  # [phone]  Permission to leave phone message Yes  Some recent data might be hidden     Patient questions:  Do you have a fever, pain , or abdominal swelling? No. Pain Score  0 *  Have you tolerated food without any problems? Yes.    Have you been able to return to your normal activities? Yes.    Do you have any questions about your discharge instructions: Diet   No. Medications  No. Follow up visit  No.  Do you have questions or concerns about your Care? No.  Actions: * If pain score is 4 or above: No action needed, pain <4.

## 2017-04-11 NOTE — Telephone Encounter (Signed)
Left message on f/u call 

## 2017-04-12 ENCOUNTER — Encounter: Payer: Self-pay | Admitting: Internal Medicine

## 2017-04-12 DIAGNOSIS — Z860101 Personal history of adenomatous and serrated colon polyps: Secondary | ICD-10-CM

## 2017-04-12 DIAGNOSIS — Z86010 Personal history of colonic polyps: Secondary | ICD-10-CM

## 2017-04-12 HISTORY — DX: Personal history of colonic polyps: Z86.010

## 2017-04-12 HISTORY — DX: Personal history of adenomatous and serrated colon polyps: Z86.0101

## 2017-04-12 NOTE — Progress Notes (Signed)
8 mm tub adenoma Recall 2024 My Chart letter

## 2017-04-26 DIAGNOSIS — H04123 Dry eye syndrome of bilateral lacrimal glands: Secondary | ICD-10-CM | POA: Diagnosis not present

## 2017-05-22 DIAGNOSIS — J209 Acute bronchitis, unspecified: Secondary | ICD-10-CM | POA: Diagnosis not present

## 2017-05-31 ENCOUNTER — Ambulatory Visit (INDEPENDENT_AMBULATORY_CARE_PROVIDER_SITE_OTHER): Payer: Medicare HMO | Admitting: Primary Care

## 2017-05-31 ENCOUNTER — Encounter: Payer: Self-pay | Admitting: Primary Care

## 2017-05-31 ENCOUNTER — Ambulatory Visit (INDEPENDENT_AMBULATORY_CARE_PROVIDER_SITE_OTHER)
Admission: RE | Admit: 2017-05-31 | Discharge: 2017-05-31 | Disposition: A | Discharge status: Home / Self Care | Payer: Medicare HMO | Source: Ambulatory Visit | Attending: Primary Care | Admitting: Primary Care

## 2017-05-31 VITALS — BP 148/86 | HR 96 | Temp 98.2°F | Ht 68.0 in | Wt 232.5 lb

## 2017-05-31 DIAGNOSIS — R112 Nausea with vomiting, unspecified: Secondary | ICD-10-CM

## 2017-05-31 DIAGNOSIS — R05 Cough: Secondary | ICD-10-CM

## 2017-05-31 DIAGNOSIS — R059 Cough, unspecified: Secondary | ICD-10-CM

## 2017-05-31 LAB — COMPREHENSIVE METABOLIC PANEL
ALBUMIN: 4.4 g/dL (ref 3.5–5.2)
ALK PHOS: 85 U/L (ref 39–117)
ALT: 45 U/L (ref 0–53)
AST: 57 U/L — ABNORMAL HIGH (ref 0–37)
BILIRUBIN TOTAL: 1.6 mg/dL — AB (ref 0.2–1.2)
BUN: 16 mg/dL (ref 6–23)
CALCIUM: 10.1 mg/dL (ref 8.4–10.5)
CO2: 30 mEq/L (ref 19–32)
Chloride: 95 mEq/L — ABNORMAL LOW (ref 96–112)
Creatinine, Ser: 1.3 mg/dL (ref 0.40–1.50)
GFR: 58.32 mL/min — AB (ref >60.00)
Glucose, Bld: 120 mg/dL — ABNORMAL HIGH (ref 70–99)
POTASSIUM: 4.1 meq/L (ref 3.5–5.1)
Sodium: 139 mEq/L (ref 135–145)
TOTAL PROTEIN: 8.2 g/dL (ref 6.0–8.3)

## 2017-05-31 LAB — CBC WITH DIFFERENTIAL/PLATELET
Basophils Absolute: 0.1 10*3/uL (ref 0.0–0.1)
Basophils Relative: 1.8 % (ref 0.0–3.0)
EOS PCT: 0.1 % (ref 0.0–5.0)
Eosinophils Absolute: 0 10*3/uL (ref 0.0–0.7)
HEMATOCRIT: 40 % (ref 39.0–52.0)
HEMOGLOBIN: 13.7 g/dL (ref 13.0–17.0)
LYMPHS ABS: 0.5 10*3/uL — AB (ref 0.7–4.0)
LYMPHS PCT: 16 % (ref 12.0–46.0)
MCHC: 34.2 g/dL (ref 30.0–36.0)
MCV: 105.5 fl — AB (ref 78.0–100.0)
MONOS PCT: 8.7 % (ref 3.0–12.0)
Monocytes Absolute: 0.3 10*3/uL (ref 0.1–1.0)
NEUTROS PCT: 73.4 % (ref 43.0–77.0)
Neutro Abs: 2.1 10*3/uL (ref 1.4–7.7)
Platelets: 107 10*3/uL — ABNORMAL LOW (ref 150.0–400.0)
RBC: 3.79 Mil/uL — AB (ref 4.22–5.81)
RDW: 14.9 % (ref 11.5–15.5)
WBC: 2.9 10*3/uL — ABNORMAL LOW (ref 4.0–10.5)

## 2017-05-31 NOTE — Patient Instructions (Signed)
Complete xray(s) and labs prior to leaving today.   You may take the ondansetron every 8 hours as needed for nausea and vomiting.   Work on intake of water to restore and prevent further dehydration.   I'll be in touch later today.  It was a pleasure to see you today!

## 2017-05-31 NOTE — Progress Notes (Signed)
Subjective:    Patient ID: Devin Howell, male    DOB: Jul 31, 1950, 67 y.o.   MRN: [id number]  HPI  Devin Howell is a 67 year old male with a history of hypertension and GERD who presents today with a chief complaint of cough.   He also reports chest congestion, vomiting, nasal congestion. His cough began one month ago.  His wife recently had pneumonia and is still recovering. He was evaluated two Saturday's ago at the Urgent Care center and was provided with a prescription for Azithromycin. He's felt no improvement after treatment.   His vomiting began two days ago and is vomiting 7 times on average. His last episode of vomiting was at 8 am this morning. He denies diarrhea, fevers, abdominal pain, bloody stools. He last ate two days ago. He was able to drink a small amount of water this morning after taking pantoprazole and ondansetron.   Review of Systems  Constitutional: Positive for fatigue. Negative for chills and fever.  HENT: Positive for congestion. Negative for sinus pressure and sore throat.   Respiratory: Positive for cough. Negative for shortness of breath.   Gastrointestinal: Positive for nausea. Negative for abdominal pain, blood in stool and diarrhea.  Neurological: Positive for headaches.       Past Medical History:  Diagnosis Date  . Cataract   . Chronic gout   . CKD (chronic kidney disease)    pt. denies  . Erectile dysfunction   . GERD (gastroesophageal reflux disease)   . Herpes simplex   . Hx of adenomatous polyp of colon 04/12/2017  . Hyperlipidemia    pt. denies  . Hypertension    pt. denies  . OSA on CPAP   . Sleep apnea    cpap  . Testosterone deficiency      Social History   Socioeconomic History  . Marital status: Married    Spouse name: Not on file  . Number of children: Not on file  . Years of education: Not on file  . Highest education level: Not on file  Social Needs  . Financial resource strain: Not on file  . Food insecurity -  worry: Not on file  . Food insecurity - inability: Not on file  . Transportation needs - medical: Not on file  . Transportation needs - non-medical: Not on file  Occupational History  . Not on file  Tobacco Use  . Smoking status: Never Smoker  . Smokeless tobacco: Never Used  Substance and Sexual Activity  . Alcohol use: Yes    Alcohol/week: 1.2 oz    Types: 2 Glasses of wine per week  . Drug use: No  . Sexual activity: Not on file  Other Topics Concern  . Not on file  Social History Narrative  . Not on file    Past Surgical History:  Procedure Laterality Date  . COLONOSCOPY    . TONSILLECTOMY AND ADENOIDECTOMY  1960    Family History  Problem Relation Age of Onset  . Rectal cancer Mother   . Colon polyps Neg Hx   . Esophageal cancer Neg Hx   . Colon cancer Neg Hx   . Stomach cancer Neg Hx     No Known Allergies  Current Outpatient Medications on File Prior to Visit  Medication Sig Dispense Refill  . albuterol (VENTOLIN HFA) 108 (90 Base) MCG/ACT inhaler Inhale 2 puffs into the lungs every 4-6 hours as needed for shortness of breath. 18 g 0  . allopurinol (  ZYLOPRIM) 300 MG tablet Take 1 tablet (300 mg total) by mouth daily. 90 tablet 3  . amLODipine (NORVASC) 5 MG tablet TAKE 1 TABLET BY MOUTH DAILY 90 tablet 1  . atorvastatin (LIPITOR) 20 MG tablet Take 20 mg by mouth daily.    Marland Kitchen BESIVANCE 0.6 % SUSP INSTILL 1 DROP INTO RIGHT EYE 3 TIMES A DAY  1  . clobetasol ointment (TEMOVATE) 9.73 % Apply 1 application topically as needed (for ecsema).     . clonazePAM (KLONOPIN) 0.5 MG tablet Take 0.5 mg by mouth 2 (two) times daily as needed for anxiety.    . DULoxetine HCl 40 MG CPEP Take 40 mg by mouth daily.    . DUREZOL 0.05 % EMUL INSTILL 1 DROP INTO RIGHT EYE 3 TIMES A DAY  1  . furosemide (LASIX) 40 MG tablet Take 1 tablet (40 mg total) by mouth daily. 90 tablet 1  . PROLENSA 0.07 % SOLN INSTILL 1 DROP INTO LEFT EYE AT BEDTIME  1  . traZODone (DESYREL) 50 MG tablet  Take 50 mg by mouth at bedtime as needed.     . sildenafil (VIAGRA) 100 MG tablet Take 1/2-1 tablet by mouth 30 minutes prior to intercourse. (Patient not taking: Reported on 03/25/2017) 6 tablet 3   No current facility-administered medications on file prior to visit.     BP (!) 148/86   Pulse 96   Temp 98.2 F (36.8 C) (Oral)   Ht 5\' 8"  (1.727 m)   Wt 232 lb 8 oz (105.5 kg)   SpO2 95%   BMI 35.35 kg/m    Objective:   Physical Exam  Constitutional: He appears ill.  Appears fatigued  HENT:  Right Ear: Tympanic membrane and ear canal normal.  Left Ear: Tympanic membrane and ear canal normal.  Nose: No mucosal edema. Right sinus exhibits no maxillary sinus tenderness and no frontal sinus tenderness. Left sinus exhibits no maxillary sinus tenderness and no frontal sinus tenderness.  Mouth/Throat: Oropharynx is clear and moist.  Eyes: Conjunctivae are normal.  Neck: Neck supple.  Cardiovascular: Normal rate and regular rhythm.  Pulmonary/Chest: Effort normal and breath sounds normal. He has no wheezes. He has no rales.  Skin: Skin is warm and dry.          Assessment & Plan:  URI vs Bronchitis vs Pneumonia:  Cough, congestion, fatigue x 1 month, exposed to pneumonia from wife. Exam today overall stable, no abnormal lung sounds.  Check chest xray today.  Could very well be post viral cough/syndrome, especially since he found no improvement with Azithromycin. Fluids, rest, await chest xray results.  Nausea and Vomiting:  Present for the past 24 hours. Some improvement with ondansetron that he had from a prior Rx. Check CBC, CMP. Abdominal exam unremarkable. Do suspect viral involvement.  Continue ondansetron. Discussed importance of water intake to recover from suspected dehydration and prevent further dehydration.  Await labs.  Pleas Koch, NP

## 2017-06-01 ENCOUNTER — Other Ambulatory Visit: Payer: Self-pay | Admitting: Primary Care

## 2017-06-01 ENCOUNTER — Encounter: Payer: Self-pay | Admitting: Primary Care

## 2017-06-01 DIAGNOSIS — D709 Neutropenia, unspecified: Secondary | ICD-10-CM

## 2017-06-01 MED ORDER — BENZONATATE 200 MG PO CAPS: 200.0000 mg | ORAL_CAPSULE | Freq: Three times a day (TID) | ORAL | 0 refills | Status: DC | PRN

## 2017-06-02 ENCOUNTER — Other Ambulatory Visit (INDEPENDENT_AMBULATORY_CARE_PROVIDER_SITE_OTHER): Payer: Medicare HMO

## 2017-06-02 DIAGNOSIS — D709 Neutropenia, unspecified: Secondary | ICD-10-CM

## 2017-06-02 LAB — CBC
HCT: 41.1 % (ref 39.0–52.0)
HEMOGLOBIN: 14.3 g/dL (ref 13.0–17.0)
MCHC: 34.7 g/dL (ref 30.0–36.0)
MCV: 104.8 fl — ABNORMAL HIGH (ref 78.0–100.0)
PLATELETS: 110 10*3/uL — AB (ref 150.0–400.0)
RBC: 3.92 Mil/uL — ABNORMAL LOW (ref 4.22–5.81)
RDW: 14.7 % (ref 11.5–15.5)
WBC: 3.8 10*3/uL — ABNORMAL LOW (ref 4.0–10.5)

## 2017-06-03 ENCOUNTER — Other Ambulatory Visit: Payer: Self-pay | Admitting: Primary Care

## 2017-06-03 DIAGNOSIS — D709 Neutropenia, unspecified: Secondary | ICD-10-CM

## 2017-06-03 LAB — PATHOLOGIST SMEAR REVIEW

## 2017-06-06 DIAGNOSIS — Z961 Presence of intraocular lens: Secondary | ICD-10-CM | POA: Diagnosis not present

## 2017-06-06 DIAGNOSIS — I10 Essential (primary) hypertension: Secondary | ICD-10-CM | POA: Diagnosis not present

## 2017-06-06 DIAGNOSIS — H2512 Age-related nuclear cataract, left eye: Secondary | ICD-10-CM | POA: Diagnosis not present

## 2017-06-06 DIAGNOSIS — H26491 Other secondary cataract, right eye: Secondary | ICD-10-CM | POA: Diagnosis not present

## 2017-06-13 ENCOUNTER — Encounter: Payer: Self-pay | Admitting: Primary Care

## 2017-06-13 DIAGNOSIS — M1A9XX0 Chronic gout, unspecified, without tophus (tophi): Secondary | ICD-10-CM

## 2017-06-13 DIAGNOSIS — Z9841 Cataract extraction status, right eye: Secondary | ICD-10-CM | POA: Diagnosis not present

## 2017-06-13 MED ORDER — ALLOPURINOL 300 MG PO TABS: 300.0000 mg | ORAL_TABLET | Freq: Every day | ORAL | 3 refills | Status: DC

## 2017-06-14 DIAGNOSIS — H25812 Combined forms of age-related cataract, left eye: Secondary | ICD-10-CM | POA: Diagnosis not present

## 2017-06-14 DIAGNOSIS — H2512 Age-related nuclear cataract, left eye: Secondary | ICD-10-CM | POA: Diagnosis not present

## 2017-06-22 ENCOUNTER — Encounter: Payer: Self-pay | Admitting: Primary Care

## 2017-06-22 DIAGNOSIS — F329 Major depressive disorder, single episode, unspecified: Secondary | ICD-10-CM

## 2017-06-22 DIAGNOSIS — E782 Mixed hyperlipidemia: Secondary | ICD-10-CM

## 2017-06-22 DIAGNOSIS — F32A Depression, unspecified: Secondary | ICD-10-CM

## 2017-06-22 DIAGNOSIS — F419 Anxiety disorder, unspecified: Secondary | ICD-10-CM

## 2017-06-24 MED ORDER — TRAZODONE HCL 50 MG PO TABS: 50.0000 mg | ORAL_TABLET | Freq: Every evening | ORAL | 1 refills | Status: DC | PRN

## 2017-06-24 MED ORDER — ATORVASTATIN CALCIUM 20 MG PO TABS: 20.0000 mg | ORAL_TABLET | Freq: Every evening | ORAL | 2 refills | Status: DC

## 2017-06-30 DIAGNOSIS — E291 Testicular hypofunction: Secondary | ICD-10-CM | POA: Diagnosis not present

## 2017-06-30 DIAGNOSIS — N5201 Erectile dysfunction due to arterial insufficiency: Secondary | ICD-10-CM | POA: Diagnosis not present

## 2017-07-04 DIAGNOSIS — E291 Testicular hypofunction: Secondary | ICD-10-CM | POA: Diagnosis not present

## 2017-07-05 ENCOUNTER — Encounter (INDEPENDENT_AMBULATORY_CARE_PROVIDER_SITE_OTHER): Payer: Self-pay

## 2017-07-08 ENCOUNTER — Other Ambulatory Visit: Payer: Self-pay | Admitting: Primary Care

## 2017-07-08 DIAGNOSIS — I10 Essential (primary) hypertension: Secondary | ICD-10-CM

## 2017-07-21 ENCOUNTER — Encounter: Payer: Self-pay | Admitting: Primary Care

## 2017-07-21 ENCOUNTER — Telehealth: Payer: Self-pay | Admitting: Primary Care

## 2017-07-21 ENCOUNTER — Encounter (INDEPENDENT_AMBULATORY_CARE_PROVIDER_SITE_OTHER): Payer: Self-pay

## 2017-07-21 DIAGNOSIS — E785 Hyperlipidemia, unspecified: Secondary | ICD-10-CM

## 2017-07-21 DIAGNOSIS — I10 Essential (primary) hypertension: Secondary | ICD-10-CM

## 2017-07-21 DIAGNOSIS — R000 Tachycardia, unspecified: Secondary | ICD-10-CM

## 2017-07-21 DIAGNOSIS — R066 Hiccough: Secondary | ICD-10-CM

## 2017-07-21 MED ORDER — BACLOFEN 10 MG PO TABS: ORAL_TABLET | ORAL | 0 refills | Status: DC

## 2017-07-21 NOTE — Telephone Encounter (Signed)
Copied from [place] [id number]. Topic: Quick Communication - See Telephone Encounter >> Jul 21, 2017  2:52 PM Percell Belt A wrote: CRM for notification. See Telephone encounter for: 07/21/17. Pt wife called in and stated that pt has had hiccups for 3 days now and pt took some med in the past that help with this.  She said that she did not know the med, they were not at home right not to look.  They are current in [place] while home is be remodeled.   She would like to know if this med could be called in   [place] - pt will call back with the exact address and phone number

## 2017-07-21 NOTE — Telephone Encounter (Signed)
[facility] [place]

## 2017-07-21 NOTE — Telephone Encounter (Signed)
Noted, will send Rx for baclofen for hiccups. Take 1 tablet by mouth three times daily.  Please check to see if he received the Rx and have him follow up with me if his hiccups persist.

## 2017-07-21 NOTE — Telephone Encounter (Signed)
Patient called checking on the status of his mychart request.  He has the hiccups and he can't wait 3 days for his prescription. Please send.

## 2017-07-21 NOTE — Telephone Encounter (Signed)
[facility].

## 2017-07-22 NOTE — Telephone Encounter (Addendum)
Per DPR, left detail message of Kate Clark's comments for patient. 

## 2017-08-03 DIAGNOSIS — E291 Testicular hypofunction: Secondary | ICD-10-CM | POA: Diagnosis not present

## 2017-08-05 ENCOUNTER — Ambulatory Visit (INDEPENDENT_AMBULATORY_CARE_PROVIDER_SITE_OTHER): Payer: Medicare HMO | Admitting: Family Medicine

## 2017-08-05 ENCOUNTER — Encounter: Payer: Self-pay | Admitting: Family Medicine

## 2017-08-05 VITALS — BP 138/78 | HR 101 | Temp 98.1°F | Ht 68.0 in | Wt 219.0 lb

## 2017-08-05 DIAGNOSIS — R112 Nausea with vomiting, unspecified: Secondary | ICD-10-CM | POA: Diagnosis not present

## 2017-08-05 DIAGNOSIS — E039 Hypothyroidism, unspecified: Secondary | ICD-10-CM

## 2017-08-05 LAB — COMPREHENSIVE METABOLIC PANEL
ALBUMIN: 4.2 g/dL (ref 3.5–5.2)
ALK PHOS: 97 U/L (ref 39–117)
ALT: 37 U/L (ref 0–53)
AST: 52 U/L — AB (ref 0–37)
BILIRUBIN TOTAL: 2.2 mg/dL — AB (ref 0.2–1.2)
BUN: 22 mg/dL (ref 6–23)
CALCIUM: 9.6 mg/dL (ref 8.4–10.5)
CHLORIDE: 91 meq/L — AB (ref 96–112)
CO2: 29 mEq/L (ref 19–32)
CREATININE: 1.75 mg/dL — AB (ref 0.40–1.50)
GFR: 41.36 mL/min — ABNORMAL LOW (ref >60.00)
Glucose, Bld: 125 mg/dL — ABNORMAL HIGH (ref 70–99)
Potassium: 3.9 mEq/L (ref 3.5–5.1)
SODIUM: 143 meq/L (ref 135–145)
TOTAL PROTEIN: 7.8 g/dL (ref 6.0–8.3)

## 2017-08-05 LAB — CBC WITH DIFFERENTIAL/PLATELET
Basophils Absolute: 0 10*3/uL (ref 0.0–0.1)
Basophils Relative: 0.8 % (ref 0.0–3.0)
EOS PCT: 0.4 % (ref 0.0–5.0)
Eosinophils Absolute: 0 10*3/uL (ref 0.0–0.7)
HCT: 39.6 % (ref 39.0–52.0)
Hemoglobin: 13.4 g/dL (ref 13.0–17.0)
LYMPHS ABS: 0.8 10*3/uL (ref 0.7–4.0)
Lymphocytes Relative: 16 % (ref 12.0–46.0)
MCHC: 33.9 g/dL (ref 30.0–36.0)
MCV: 106 fl — AB (ref 78.0–100.0)
MONOS PCT: 7.4 % (ref 3.0–12.0)
Monocytes Absolute: 0.4 10*3/uL (ref 0.1–1.0)
NEUTROS PCT: 75.4 % (ref 43.0–77.0)
Neutro Abs: 3.6 10*3/uL (ref 1.4–7.7)
Platelets: 152 10*3/uL (ref 150.0–400.0)
RBC: 3.74 Mil/uL — AB (ref 4.22–5.81)
RDW: 15.3 % (ref 11.5–15.5)
WBC: 4.8 10*3/uL (ref 4.0–10.5)

## 2017-08-05 LAB — TSH: TSH: 9.35 u[IU]/mL — AB (ref 0.35–4.50)

## 2017-08-05 MED ORDER — PROMETHAZINE HCL 25 MG/ML IJ SOLN
25.0000 mg | Freq: Once | INTRAMUSCULAR | Status: AC
  Administered 2017-08-05: 25 mg via INTRAMUSCULAR

## 2017-08-05 NOTE — Patient Instructions (Signed)
I hope you are feeling better soon  Please drink 1 ounce of gatorade every 15-20 minutes while awake, if vomiting, please decrease to 1/2 ounce  If unable to keep liquids down over next 24 hours, go to ER  Advance diet slowly when able- one or two bites at a time bland food- applesauce, banana, cracker, dry toast, etc.   I will notify you of lab results

## 2017-08-05 NOTE — Progress Notes (Signed)
Subjective:    Patient ID: Devin Howell, male    DOB: January 13, 1951, 67 y.o.   MRN: [id number]  HPI This is a 67 yo male, accompanied by his wife, who presents today with emesis x 5 days. Started once a day and has increased throughout the week. Can't keep any food or water down. Vomited 4 times today. Has been able to keep some of medications down. No relief with ondansetron.   Thinks it is related to his "nerves," recent losses of sister and brother. Has been feeling more nervous and jittery, balance is off. Has had issues with falling and passing out- has a cardiology appointment next week. No previous history of severe anxiety. Takes duloxetine and clonazepam. Is planning to set up an appointment with psychiatry. Both he and his wife state that they are not handling things well.   No chest pain, no SOB, no headache, normal appetite prior to emesis, energy level good before the vomiting started, no fever, no abdominal pain, no dysuria.    Past Medical History:  Diagnosis Date  . Cataract   . Chronic gout   . CKD (chronic kidney disease)    pt. denies  . Erectile dysfunction   . GERD (gastroesophageal reflux disease)   . Herpes simplex   . Hx of adenomatous polyp of colon 04/12/2017  . Hyperlipidemia    pt. denies  . Hypertension    pt. denies  . OSA on CPAP   . Sleep apnea    cpap  . Testosterone deficiency    Past Surgical History:  Procedure Laterality Date  . COLONOSCOPY    . TONSILLECTOMY AND ADENOIDECTOMY  1960   Family History  Problem Relation Age of Onset  . Rectal cancer Mother   . Colon polyps Neg Hx   . Esophageal cancer Neg Hx   . Colon cancer Neg Hx   . Stomach cancer Neg Hx    Social History   Tobacco Use  . Smoking status: Never Smoker  . Smokeless tobacco: Never Used  Substance Use Topics  . Alcohol use: Yes    Alcohol/week: 1.2 oz    Types: 2 Glasses of wine per week  . Drug use: No      Review of Systems Per HPI    Objective:   Physical Exam  Constitutional: He appears well-developed and well-nourished. He appears ill. No distress.  HENT:  Head: Normocephalic and atraumatic.  Mouth/Throat: Oropharynx is clear and moist and mucous membranes are normal.  Eyes: Conjunctivae are normal.  Cardiovascular: Normal rate, regular rhythm and normal heart sounds.  Pulmonary/Chest: Effort normal and breath sounds normal.  Skin: Skin is warm and dry. Capillary refill takes less than 2 seconds. He is not diaphoretic.  Normal skin turgor.   Psychiatric: He has a normal mood and affect. His behavior is normal. Judgment and thought content normal.  Vitals reviewed.     BP 138/78 (BP Location: Left Arm, Patient Position: Sitting, Cuff Size: Normal)   Pulse (!) 101   Temp 98.1 F (36.7 C) (Oral)   Ht 5\' 8"  (1.727 m)   Wt 219 lb (99.3 kg)   SpO2 95%   BMI 33.30 kg/m  Wt Readings from Last 3 Encounters:  08/05/17 219 lb (99.3 kg)  05/31/17 232 lb 8 oz (105.5 kg)  04/08/17 232 lb (105.2 kg)       Assessment & Plan:  1. Non-intractable vomiting with nausea, unspecified vomiting type - Provided written and verbal information regarding  diagnosis and treatment. - if unable to keep small amounts liquid down over next 24 hours he was instructed to go to the ER- verbalized understanding - instructed on slow fluid replenishment, gradual introduction of bland solids - CBC with Differential - Comprehensive metabolic panel - TSH - promethazine (PHENERGAN) injection 25 mg   Clarene Reamer, FNP-BC  [facility]  08/05/2017 2:56 PM

## 2017-08-08 DIAGNOSIS — E291 Testicular hypofunction: Secondary | ICD-10-CM | POA: Diagnosis not present

## 2017-08-08 DIAGNOSIS — N5201 Erectile dysfunction due to arterial insufficiency: Secondary | ICD-10-CM | POA: Diagnosis not present

## 2017-08-08 MED ORDER — LEVOTHYROXINE SODIUM 50 MCG PO TABS: 50.0000 ug | ORAL_TABLET | Freq: Every day | ORAL | 0 refills | Status: DC

## 2017-08-08 NOTE — Addendum Note (Signed)
Addended by: Clarene Reamer B on: 08/08/2017 04:49 PM   Modules accepted: Orders

## 2017-08-10 ENCOUNTER — Encounter: Payer: Self-pay | Admitting: Cardiovascular Disease

## 2017-08-10 ENCOUNTER — Ambulatory Visit: Payer: Medicare HMO | Admitting: Cardiovascular Disease

## 2017-08-10 VITALS — BP 122/70 | HR 99 | Ht 68.0 in | Wt 218.0 lb

## 2017-08-10 DIAGNOSIS — R000 Tachycardia, unspecified: Secondary | ICD-10-CM

## 2017-08-10 DIAGNOSIS — E7800 Pure hypercholesterolemia, unspecified: Secondary | ICD-10-CM | POA: Diagnosis not present

## 2017-08-10 DIAGNOSIS — I10 Essential (primary) hypertension: Secondary | ICD-10-CM | POA: Diagnosis not present

## 2017-08-10 DIAGNOSIS — E785 Hyperlipidemia, unspecified: Secondary | ICD-10-CM | POA: Diagnosis not present

## 2017-08-10 DIAGNOSIS — N189 Chronic kidney disease, unspecified: Secondary | ICD-10-CM

## 2017-08-10 MED ORDER — AMLODIPINE BESYLATE 2.5 MG PO TABS: 2.5000 mg | ORAL_TABLET | Freq: Every day | ORAL | 3 refills | Status: DC

## 2017-08-10 MED ORDER — FUROSEMIDE 20 MG PO TABS: 20.0000 mg | ORAL_TABLET | Freq: Every day | ORAL | 3 refills | Status: DC

## 2017-08-10 NOTE — Assessment & Plan Note (Signed)
History of hyperlipidemia with lipid profile performed 12/13/2016 revealing a total cholesterol 305, LDL  195 and LDL and LDL of 95 on statin therapy.

## 2017-08-10 NOTE — Assessment & Plan Note (Addendum)
History of essential hypertension her blood pressure measured today at 122/70.  Resulting edema.  He has complained of dizziness over the last months which sounds somewhat orthostatic he does have chronic renal insufficiency with a creatinine in the high 1 range.  I am going to decrease his amlodipine and furosemide half the price liberalize his salt intake keep a blood pressure log over the next 30 days.  He will see Cyril Mourning back in 1 month pharmacology such as needed blockers for treatment of hypertension not requiring diuretics for edema.  Orthostatic blood pressure measurements did show approximately 20 mm drop in blood pressure lying to standing.

## 2017-08-10 NOTE — Patient Instructions (Signed)
Medication Instructions: Your physician recommends that you continue on your current medications as directed. Please refer to the Current Medication list given to you today.  Decrease Amlodipine to 2.5 mg daily.  Decrease Furosemide to 20 mg daily.   Testing/Procedures: Your physician has requested that you have an echocardiogram. Echocardiography is a painless test that uses sound waves to create images of your heart. It provides your doctor with information about the size and shape of your heart and how well your heart's chambers and valves are working. This procedure takes approximately one hour. There are no restrictions for this procedure.  Follow-Up: We request that you follow-up in: 1 month with PharmD in [facility] and in 3 months with Dr Andria Rhein will receive a reminder letter in the mail two months in advance. If you don't receive a letter, please call our office to schedule the follow-up appointment.  If you need a refill on your cardiac medications before your next appointment, please call your pharmacy.

## 2017-08-10 NOTE — Progress Notes (Signed)
08/10/2017 LUCILLE CRICHLOW   Apr 05, 1950  [id number]  Primary Physician Pleas Koch, NP Primary Cardiologist: Lorretta Harp MD Lupe Carney, [place]  HPI:  Devin Howell is a 67 y.o. moderately overweight married Caucasian male 69, grandfather 5 grandchildren who is a retired VP of a [employer] referred by Allie Bossier NP for cardiovascular evaluation because of dizziness and symptoms that resemble orthostatic hypotension..  He has a history of hypertension and hyperlipidemia.  He drinks 2 drinks a day and walks 3 to 5 miles 3 days a week.  He is never had a heart attack or stroke denies chest pain or shortness of breath.  The long history of chronic renal insufficiency with serum creatinines running in the high 1 range.  He is on amlodipine and furosemide for edema resulting from this.  Over the last 6 months he is noticed dizziness principally when he changes positions..   Current Meds  Medication Sig  . allopurinol (ZYLOPRIM) 300 MG tablet Take 1 tablet (300 mg total) by mouth daily.  Marland Kitchen amLODipine (NORVASC) 2.5 MG tablet Take 1 tablet (2.5 mg total) by mouth daily.  Marland Kitchen atorvastatin (LIPITOR) 20 MG tablet Take 1 tablet (20 mg total) by mouth every evening.  . baclofen (LIORESAL) 10 MG tablet Take 1 tablet by mouth three times daily as needed for hiccups.  . clobetasol ointment (TEMOVATE) 4.09 % Apply 1 application topically as needed (for ecsema).   . clonazePAM (KLONOPIN) 0.5 MG tablet Take 0.5 mg by mouth 2 (two) times daily as needed for anxiety.  . DULoxetine HCl 40 MG CPEP Take 40 mg by mouth daily.  . furosemide (LASIX) 20 MG tablet Take 1 tablet (20 mg total) by mouth daily.  Marland Kitchen levothyroxine (SYNTHROID, LEVOTHROID) 50 MCG tablet Take 1 tablet (50 mcg total) by mouth daily.  Marland Kitchen PROLENSA 0.07 % SOLN INSTILL 1 DROP INTO LEFT EYE AT BEDTIME  . sildenafil (VIAGRA) 100 MG tablet Take 1/2-1 tablet by mouth 30 minutes prior to intercourse.  . traZODone (DESYREL) 50  MG tablet Take 1 tablet (50 mg total) by mouth at bedtime as needed.  . [DISCONTINUED] amLODipine (NORVASC) 5 MG tablet TAKE 1 TABLET BY MOUTH DAILY  . [DISCONTINUED] furosemide (LASIX) 40 MG tablet Take 1 tablet (40 mg total) by mouth daily.     No Known Allergies  Social History   Socioeconomic History  . Marital status: Married    Spouse name: Not on file  . Number of children: Not on file  . Years of education: Not on file  . Highest education level: Not on file  Occupational History  . Not on file  Social Needs  . Financial resource strain: Not on file  . Food insecurity:    Worry: Not on file    Inability: Not on file  . Transportation needs:    Medical: Not on file    Non-medical: Not on file  Tobacco Use  . Smoking status: Never Smoker  . Smokeless tobacco: Never Used  Substance and Sexual Activity  . Alcohol use: Yes    Alcohol/week: 1.2 oz    Types: 2 Glasses of wine per week  . Drug use: No  . Sexual activity: Not on file  Lifestyle  . Physical activity:    Days per week: Not on file    Minutes per session: Not on file  . Stress: Not on file  Relationships  . Social connections:    Talks  on phone: Not on file    Gets together: Not on file    Attends religious service: Not on file    Active member of club or organization: Not on file    Attends meetings of clubs or organizations: Not on file    Relationship status: Not on file  . Intimate partner violence:    Fear of current or ex partner: Not on file    Emotionally abused: Not on file    Physically abused: Not on file    Forced sexual activity: Not on file  Other Topics Concern  . Not on file  Social History Narrative  . Not on file     Review of Systems: General: negative for chills, fever, night sweats or weight changes.  Cardiovascular: negative for chest pain, dyspnea on exertion, edema, orthopnea, palpitations, paroxysmal nocturnal dyspnea or shortness of breath Dermatological: negative for  rash Respiratory: negative for cough or wheezing Urologic: negative for hematuria Abdominal: negative for nausea, vomiting, diarrhea, bright red blood per rectum, melena, or hematemesis Neurologic: negative for visual changes, syncope, or dizziness All other systems reviewed and are otherwise negative except as noted above.    Blood pressure 122/70, pulse 99, height 5\' 8"  (1.727 m), weight 218 lb (98.9 kg).  General appearance: alert and no distress Neck: no adenopathy, no carotid bruit, no JVD, supple, symmetrical, trachea midline and thyroid not enlarged, symmetric, no tenderness/mass/nodules Lungs: clear to auscultation bilaterally Heart: regular rate and rhythm, S1, S2 normal, no murmur, click, rub or gallop Extremities: extremities normal, atraumatic, no cyanosis or edema Pulses: 2+ and symmetric Skin: Skin color, texture, turgor normal. No rashes or lesions Neurologic: Alert and oriented X 3, normal strength and tone. Normal symmetric reflexes. Normal coordination and gait  EKG sinus tachycardia at 99 with nonspecific ST and T wave changes.  I personally reviewed this EKG  ASSESSMENT AND PLAN:   Essential hypertension History of essential hypertension her blood pressure measured today at 122/70.  Resulting edema.  He has complained of dizziness over the last months which sounds somewhat orthostatic he does have chronic renal insufficiency with a creatinine in the high 1 range.  I am going to decrease his amlodipine and furosemide half the price liberalize his salt intake keep a blood pressure log over the next 30 days.  He will see Cyril Mourning back in 1 month pharmacology such as needed blockers for treatment of hypertension not requiring diuretics for edema.  Hyperlipidemia History of hyperlipidemia with lipid profile performed 12/13/2016 revealing a total cholesterol 305, LDL  195 and LDL and LDL of 95 on statin therapy.      Lorretta Harp MD FACP,FACC,FAHA,  [facility] 08/10/2017 10:16 AM

## 2017-08-15 ENCOUNTER — Telehealth: Payer: Self-pay | Admitting: Cardiovascular Disease

## 2017-08-15 NOTE — Telephone Encounter (Signed)
Received Notes from [facility], [place] on 08/15/17, Appt 11/16/17 @ 1:30PM. NV

## 2017-08-23 DIAGNOSIS — R69 Illness, unspecified: Secondary | ICD-10-CM | POA: Diagnosis not present

## 2017-08-23 DIAGNOSIS — F411 Generalized anxiety disorder: Secondary | ICD-10-CM | POA: Diagnosis not present

## 2017-08-24 ENCOUNTER — Other Ambulatory Visit: Payer: Self-pay

## 2017-08-24 ENCOUNTER — Ambulatory Visit (HOSPITAL_COMMUNITY): Payer: Medicare HMO | Attending: Cardiovascular Disease

## 2017-08-24 DIAGNOSIS — R000 Tachycardia, unspecified: Secondary | ICD-10-CM | POA: Diagnosis not present

## 2017-08-24 DIAGNOSIS — N189 Chronic kidney disease, unspecified: Secondary | ICD-10-CM | POA: Insufficient documentation

## 2017-08-24 DIAGNOSIS — E785 Hyperlipidemia, unspecified: Secondary | ICD-10-CM | POA: Diagnosis not present

## 2017-08-24 DIAGNOSIS — I129 Hypertensive chronic kidney disease with stage 1 through stage 4 chronic kidney disease, or unspecified chronic kidney disease: Secondary | ICD-10-CM | POA: Insufficient documentation

## 2017-09-13 ENCOUNTER — Ambulatory Visit (INDEPENDENT_AMBULATORY_CARE_PROVIDER_SITE_OTHER): Payer: Medicare HMO | Admitting: Pharmacist

## 2017-09-13 VITALS — BP 128/74 | HR 98 | Ht 68.0 in

## 2017-09-13 DIAGNOSIS — I10 Essential (primary) hypertension: Secondary | ICD-10-CM

## 2017-09-13 DIAGNOSIS — N189 Chronic kidney disease, unspecified: Secondary | ICD-10-CM

## 2017-09-13 NOTE — Patient Instructions (Addendum)
Return for a  follow up appointment in 3-4 weeks  Check your blood pressure at home daily (if able) and keep record of the readings.  Take your BP meds as follows: *STOP taking furosemide daily* *TAKE furosemide 20mg  once a day as needed for ONLY edema*  *Repeat blood work today*  Bring all of your meds, your BP cuff and your record of home blood pressures to your next appointment.  Exercise as you're able, try to walk approximately 30 minutes per day.  Keep salt intake to a minimum, especially watch canned and prepared boxed foods.  Eat more fresh fruits and vegetables and fewer canned items.  Avoid eating in fast food restaurants.    HOW TO TAKE YOUR BLOOD PRESSURE: . Rest 5 minutes before taking your blood pressure. .  Don't smoke or drink caffeinated beverages for at least 30 minutes before. . Take your blood pressure before (not after) you eat. . Sit comfortably with your back supported and both feet on the floor (don't cross your legs). . Elevate your arm to heart level on a table or a desk. . Use the proper sized cuff. It should fit smoothly and snugly around your bare upper arm. There should be enough room to slip a fingertip under the cuff. The bottom edge of the cuff should be 1 inch above the crease of the elbow. . Ideally, take 3 measurements at one sitting and record the average.

## 2017-09-13 NOTE — Progress Notes (Signed)
Patient ID: Devin Howell                 DOB: [birth year]                      MRN: [id number]     HPI: Devin Howell is a 68 y.o. male referred by Dr. Gwenlyn Found to HTN clinic. PMH includes hypertension, hyperlipidemia, anxiety,CKD, gout, and depression.  He was evaluated by Dr Gwenlyn Found on 08/10/17 for dizziness caused by positional changes.  Orthostatic hypotension was suspected  and Dr Gwenlyn Found decreased amlodipine and furosemide doses by half.  Patient presents today for HTN assessment and medication titration. Denies increase fatigue, swelling, headaches or chest pain. Report no change in dizziness.   Patient developed low extremity edema in the past after increasing amlodipine dose to 10mg  daily. Furosemide was added only yo control edema. His lower extremities are clear of edema at this time. Devin Howell was able to tolerate lower doses of amlodipine without problems.   Current HTN meds:  Amlodipine 2.5mg  daily Furosemide 20mg  daily  Intolerant: none  BP goal: <130/80  118/68  Social History: denies tobacco use; reports 2 alcoholic drink per day  Diet: try to keep low sodium diet  Exercise:3-5 miles walks for 3-4 times per week  Home BP readings:  21 readings (all in the morning); average 132/78  Wt Readings from Last 3 Encounters:  08/10/17 218 lb (98.9 kg)  08/05/17 219 lb (99.3 kg)  05/31/17 232 lb 8 oz (105.5 kg)   BP Readings from Last 3 Encounters:  09/13/17 128/74  08/10/17 122/70  08/05/17 138/78   Pulse Readings from Last 3 Encounters:  09/13/17 98  08/10/17 99  08/05/17 (!) 101    Past Medical History:  Diagnosis Date  . Cataract   . Chronic gout   . CKD (chronic kidney disease)    pt. denies  . Erectile dysfunction   . GERD (gastroesophageal reflux disease)   . Herpes simplex   . Hx of adenomatous polyp of colon 04/12/2017  . Hyperlipidemia    pt. denies  . Hypertension    pt. denies  . OSA on CPAP   . Sleep apnea    cpap  . Testosterone  deficiency     Current Outpatient Medications on File Prior to Visit  Medication Sig Dispense Refill  . allopurinol (ZYLOPRIM) 300 MG tablet Take 1 tablet (300 mg total) by mouth daily. 90 tablet 3  . amLODipine (NORVASC) 2.5 MG tablet Take 1 tablet (2.5 mg total) by mouth daily. 30 tablet 3  . atorvastatin (LIPITOR) 20 MG tablet Take 1 tablet (20 mg total) by mouth every evening. 90 tablet 2  . DULoxetine (CYMBALTA) 30 MG capsule Take 30 mg by mouth daily.    Marland Kitchen levothyroxine (SYNTHROID, LEVOTHROID) 50 MCG tablet Take 1 tablet (50 mcg total) by mouth daily. 90 tablet 0  . sildenafil (VIAGRA) 100 MG tablet Take 1/2-1 tablet by mouth 30 minutes prior to intercourse. 6 tablet 3  . traZODone (DESYREL) 50 MG tablet Take 1 tablet (50 mg total) by mouth at bedtime as needed. 90 tablet 1  . baclofen (LIORESAL) 10 MG tablet Take 1 tablet by mouth three times daily as needed for hiccups. (Patient not taking: Reported on 09/13/2017) 30 each 0  . clobetasol ointment (TEMOVATE) 6.62 % Apply 1 application topically as needed (for ecsema).     . clonazePAM (KLONOPIN) 0.5 MG tablet Take 0.5 mg by mouth 2 (  two) times daily as needed for anxiety.    Marland Kitchen PROLENSA 0.07 % SOLN INSTILL 1 DROP INTO LEFT EYE AT BEDTIME  1   No current facility-administered medications on file prior to visit.     No Known Allergies  Blood pressure 128/74, pulse 98, height 5\' 8"  (1.727 m), SpO2 95 %.  Standing time 0 minutes: 102/68  Essential hypertension Blood pressure remains well control in office but, patient continues to experience dizziness upon standing especially after sitting for long periods of time.  Noted his BP drops >65mmHg immediately after standing up. Will discontinue daily furosemide, continue amlodipine 2.5mg  daily for now, and encourage proper hydration. Patient was instructed to wear compression stocking ,and to wait few seconds after standing to avoid falls. Will repeat BMEt today as well to re-assess renal  function. Plan to re-assess home BP readings in 2 weeks and increase amlodipine to 5mg  daily if BP permits. ACEI may be consider, for advantage of renal protection ,if Scr is back down to 1.3 or lower.   Quince Santana Rodriguez-Guzman PharmD, BCPS, [place] [address] [place] [address] 09/14/2017 9:00 AM

## 2017-09-14 ENCOUNTER — Encounter: Payer: Self-pay | Admitting: Pharmacist

## 2017-09-14 LAB — BASIC METABOLIC PANEL
BUN/Creatinine Ratio: 10 (ref 10–24)
BUN: 15 mg/dL (ref 8–27)
CALCIUM: 9 mg/dL (ref 8.6–10.2)
CO2: 25 mmol/L (ref 20–29)
CREATININE: 1.55 mg/dL — AB (ref 0.76–1.27)
Chloride: 102 mmol/L (ref 96–106)
GFR, EST AFRICAN AMERICAN: 52 mL/min/{1.73_m2} — AB (ref >59)
GFR, EST NON AFRICAN AMERICAN: 45 mL/min/{1.73_m2} — AB (ref >59)
Glucose: 85 mg/dL (ref 65–99)
POTASSIUM: 4.2 mmol/L (ref 3.5–5.2)
Sodium: 143 mmol/L (ref 134–144)

## 2017-09-14 MED ORDER — FUROSEMIDE 20 MG PO TABS: 20.0000 mg | ORAL_TABLET | Freq: Every day | ORAL | 3 refills | Status: DC | PRN

## 2017-09-14 NOTE — Assessment & Plan Note (Signed)
Blood pressure remains well control in office but, patient continues to experience dizziness upon standing especially after sitting for long periods of time.  Noted his BP drops >71mmHg immediately after standing up. Will discontinue daily furosemide, continue amlodipine 2.5mg  daily for now, and encourage proper hydration. Patient was instructed to wear compression stocking ,and to wait few seconds after standing to avoid falls. Will repeat BMEt today as well to re-assess renal function. Plan to re-assess home BP readings in 2 weeks and increase amlodipine to 5mg  daily if BP permits. ACEI may be consider, for advantage of renal protection ,if Scr is back down to 1.3 or lower.

## 2017-09-17 DIAGNOSIS — E039 Hypothyroidism, unspecified: Secondary | ICD-10-CM | POA: Diagnosis not present

## 2017-09-17 DIAGNOSIS — R69 Illness, unspecified: Secondary | ICD-10-CM | POA: Diagnosis not present

## 2017-09-17 DIAGNOSIS — E785 Hyperlipidemia, unspecified: Secondary | ICD-10-CM | POA: Diagnosis not present

## 2017-09-17 DIAGNOSIS — F419 Anxiety disorder, unspecified: Secondary | ICD-10-CM | POA: Diagnosis not present

## 2017-09-17 DIAGNOSIS — H04129 Dry eye syndrome of unspecified lacrimal gland: Secondary | ICD-10-CM | POA: Diagnosis not present

## 2017-09-17 DIAGNOSIS — I10 Essential (primary) hypertension: Secondary | ICD-10-CM | POA: Diagnosis not present

## 2017-09-17 DIAGNOSIS — G4700 Insomnia, unspecified: Secondary | ICD-10-CM | POA: Diagnosis not present

## 2017-09-17 DIAGNOSIS — M109 Gout, unspecified: Secondary | ICD-10-CM | POA: Diagnosis not present

## 2017-09-17 DIAGNOSIS — G4733 Obstructive sleep apnea (adult) (pediatric): Secondary | ICD-10-CM | POA: Diagnosis not present

## 2017-09-17 DIAGNOSIS — I129 Hypertensive chronic kidney disease with stage 1 through stage 4 chronic kidney disease, or unspecified chronic kidney disease: Secondary | ICD-10-CM | POA: Diagnosis not present

## 2017-09-28 DIAGNOSIS — G4733 Obstructive sleep apnea (adult) (pediatric): Secondary | ICD-10-CM | POA: Diagnosis not present

## 2017-10-09 ENCOUNTER — Other Ambulatory Visit: Payer: Self-pay | Admitting: Cardiovascular Disease

## 2017-10-09 DIAGNOSIS — I10 Essential (primary) hypertension: Secondary | ICD-10-CM

## 2017-10-09 DIAGNOSIS — N189 Chronic kidney disease, unspecified: Secondary | ICD-10-CM

## 2017-10-10 NOTE — Telephone Encounter (Signed)
Rx sent to pharmacy   

## 2017-10-11 DIAGNOSIS — R69 Illness, unspecified: Secondary | ICD-10-CM | POA: Diagnosis not present

## 2017-10-11 DIAGNOSIS — F331 Major depressive disorder, recurrent, moderate: Secondary | ICD-10-CM | POA: Diagnosis not present

## 2017-10-11 NOTE — Progress Notes (Deleted)
Patient ID: Devin Howell                 DOB: January 24, 1951                      MRN: [id number]     HPI: Devin Howell is a 67 y.o. male patient of Dr. Gwenlyn Found who presents today for hypertension follow up. PMH significant for hypertension, hyperlipidemia, anxiety,CKD, gout, and depression.  He was evaluated by Dr Gwenlyn Found on 08/10/17 for dizziness caused by positional changes.  Orthostatic hypotension was suspected  and Dr Gwenlyn Found decreased amlodipine and furosemide doses by half. At his last OV, his pressure was well controlled, but he was still experiencing dizziness upon standing. His daily furosemide was discontinued. He was encouraged to wear compression stockings.   He presents today for  Additional blood pressure management. It was considered to add ACEi or increase amlodipine.     Current HTN meds:  Furosemide 20mg  PRN Amlodipine 2.5mg  daily   Previously tried: none  BP goal: <130/21mmHg  Family History:   Social History: denies tobacco use; reports 2 alcoholic beverages per day  Diet: tries to follow low sodium diet  Exercise: 3-5 mile walks 3-4 times per week  Home BP readings:   Wt Readings from Last 3 Encounters:  08/10/17 218 lb (98.9 kg)  08/05/17 219 lb (99.3 kg)  05/31/17 232 lb 8 oz (105.5 kg)   BP Readings from Last 3 Encounters:  09/13/17 128/74  08/10/17 122/70  08/05/17 138/78   Pulse Readings from Last 3 Encounters:  09/13/17 98  08/10/17 99  08/05/17 (!) 101    Renal function: CrCl cannot be calculated (Patient's most recent lab result is older than the maximum 21 days allowed.).  Past Medical History:  Diagnosis Date  . Cataract   . Chronic gout   . CKD (chronic kidney disease)    pt. denies  . Erectile dysfunction   . GERD (gastroesophageal reflux disease)   . Herpes simplex   . Hx of adenomatous polyp of colon 04/12/2017  . Hyperlipidemia    pt. denies  . Hypertension    pt. denies  . OSA on CPAP   . Sleep apnea    cpap  .  Testosterone deficiency     Current Outpatient Medications on File Prior to Visit  Medication Sig Dispense Refill  . allopurinol (ZYLOPRIM) 300 MG tablet Take 1 tablet (300 mg total) by mouth daily. 90 tablet 3  . amLODipine (NORVASC) 2.5 MG tablet Take 1 tablet (2.5 mg total) by mouth daily. Keep OV 30 tablet 0  . atorvastatin (LIPITOR) 20 MG tablet Take 1 tablet (20 mg total) by mouth every evening. 90 tablet 2  . baclofen (LIORESAL) 10 MG tablet Take 1 tablet by mouth three times daily as needed for hiccups. (Patient not taking: Reported on 09/13/2017) 30 each 0  . clobetasol ointment (TEMOVATE) 2.70 % Apply 1 application topically as needed (for ecsema).     . clonazePAM (KLONOPIN) 0.5 MG tablet Take 0.5 mg by mouth 2 (two) times daily as needed for anxiety.    . DULoxetine (CYMBALTA) 30 MG capsule Take 30 mg by mouth daily.    . furosemide (LASIX) 20 MG tablet Take 1 tablet (20 mg total) by mouth daily as needed for edema. 30 tablet 3  . furosemide (LASIX) 20 MG tablet Take 1 tablet (20 mg total) by mouth daily. Keep OV. 30 tablet 0  . levothyroxine (SYNTHROID,  LEVOTHROID) 50 MCG tablet Take 1 tablet (50 mcg total) by mouth daily. 90 tablet 0  . PROLENSA 0.07 % SOLN INSTILL 1 DROP INTO LEFT EYE AT BEDTIME  1  . sildenafil (VIAGRA) 100 MG tablet Take 1/2-1 tablet by mouth 30 minutes prior to intercourse. 6 tablet 3  . traZODone (DESYREL) 50 MG tablet Take 1 tablet (50 mg total) by mouth at bedtime as needed. 90 tablet 1   No current facility-administered medications on file prior to visit.     No Known Allergies  There were no vitals taken for this visit.   Assessment/Plan: Hypertension:    Thank you, Lelan Pons. Patterson Hammersmith, [facility]  10/11/2017 9:23 PM

## 2017-10-12 ENCOUNTER — Other Ambulatory Visit: Payer: Self-pay | Admitting: Cardiovascular Disease

## 2017-10-12 ENCOUNTER — Ambulatory Visit: Payer: Medicare HMO

## 2017-10-12 DIAGNOSIS — N189 Chronic kidney disease, unspecified: Secondary | ICD-10-CM

## 2017-10-12 NOTE — Telephone Encounter (Signed)
Rx request sent to pharmacy.  

## 2017-10-17 ENCOUNTER — Encounter: Payer: Self-pay | Admitting: Pharmacist

## 2017-10-17 ENCOUNTER — Ambulatory Visit: Payer: Medicare HMO | Admitting: Pharmacist

## 2017-10-17 VITALS — BP 144/72 | HR 98 | Wt 233.8 lb

## 2017-10-17 DIAGNOSIS — I10 Essential (primary) hypertension: Secondary | ICD-10-CM

## 2017-10-17 MED ORDER — VALSARTAN 80 MG PO TABS: 80.0000 mg | ORAL_TABLET | Freq: Every day | ORAL | 1 refills | Status: DC

## 2017-10-17 NOTE — Patient Instructions (Signed)
Return for a follow up appointment in 4 weeks  Check your blood pressure at home daily (if able) and keep record of the readings.  Take your BP meds as follows: *START taking valsartan 80mg  daily*  Bring all of your meds, your BP cuff and your record of home blood pressures to your next appointment.  Exercise as you're able, try to walk approximately 30 minutes per day.  Keep salt intake to a minimum, especially watch canned and prepared boxed foods.  Eat more fresh fruits and vegetables and fewer canned items.  Avoid eating in fast food restaurants.    HOW TO TAKE YOUR BLOOD PRESSURE: . Rest 5 minutes before taking your blood pressure. .  Don't smoke or drink caffeinated beverages for at least 30 minutes before. . Take your blood pressure before (not after) you eat. . Sit comfortably with your back supported and both feet on the floor (don't cross your legs). . Elevate your arm to heart level on a table or a desk. . Use the proper sized cuff. It should fit smoothly and snugly around your bare upper arm. There should be enough room to slip a fingertip under the cuff. The bottom edge of the cuff should be 1 inch above the crease of the elbow. . Ideally, take 3 measurements at one sitting and record the average.

## 2017-10-17 NOTE — Assessment & Plan Note (Signed)
Blood pressure above goal of 130/80 after decreasing amlodipine dose. Will start valsartan 80mg  daily and repeat BMET in 2 weeks. Patient is to follow weeks with DR Gwenlyn Found in 4 weeks. Plan to increase valsartan to 160mg  if additional BP control needed. Will be appropriate to discontinue furosemide PRN dose during next OV if edema resolved.

## 2017-10-17 NOTE — Progress Notes (Signed)
Patient ID: Devin Howell                 DOB: Oct 11, 1950                      MRN: [id number]     HPI: Devin Howell is a 67 y.o. male referred by Dr. Gwenlyn Found to HTN clinic.PMH includes hypertension, hyperlipidemia, anxiety, CKD, gout, and depression.  Patient was evaluated by Dr Gwenlyn Found on 08/10/17 for dizziness.  Orthostatic hypotension was suspected and antihypertension medication doses decreased by half.  Patient presents today for HTN assessment and medication titration. Fatigue, swelling and dizziness resolved at this time but BP is now above goal.   Current HTN meds:  Amlodipine 2.5mg  daily Furosemide 20mg  daily as needed ONLY  Intolerant: none  BP goal: <130/80     Social History: denies tobacco use; reports 2 alcoholic drink per day  Diet: try to keep low sodium diet  Exercise:3-5 miles walks for 3-4 times per week  Home BP readings: 145/85 reported by patient  Wt Readings from Last 3 Encounters:  10/17/17 233 lb 12.8 oz (106.1 kg)  08/10/17 218 lb (98.9 kg)  08/05/17 219 lb (99.3 kg)   BP Readings from Last 3 Encounters:  10/17/17 (!) 144/72  09/13/17 128/74  08/10/17 122/70   Pulse Readings from Last 3 Encounters:  10/17/17 98  09/13/17 98  08/10/17 99    Past Medical History:  Diagnosis Date  . Cataract   . Chronic gout   . CKD (chronic kidney disease)    pt. denies  . Erectile dysfunction   . GERD (gastroesophageal reflux disease)   . Herpes simplex   . Hx of adenomatous polyp of colon 04/12/2017  . Hyperlipidemia    pt. denies  . Hypertension    pt. denies  . OSA on CPAP   . Sleep apnea    cpap  . Testosterone deficiency     Current Outpatient Medications on File Prior to Visit  Medication Sig Dispense Refill  . allopurinol (ZYLOPRIM) 300 MG tablet Take 1 tablet (300 mg total) by mouth daily. 90 tablet 3  . amLODipine (NORVASC) 2.5 MG tablet Take 1 tablet (2.5 mg total) by mouth daily. Keep OV 30 tablet 0  . atorvastatin (LIPITOR) 20 MG  tablet Take 1 tablet (20 mg total) by mouth every evening. 90 tablet 2  . clobetasol ointment (TEMOVATE) 9.48 % Apply 1 application topically as needed (for ecsema).     . DULoxetine (CYMBALTA) 30 MG capsule Take 30 mg by mouth daily.    Marland Kitchen levothyroxine (SYNTHROID, LEVOTHROID) 50 MCG tablet Take 1 tablet (50 mcg total) by mouth daily. 90 tablet 0  . PROLENSA 0.07 % SOLN INSTILL 1 DROP INTO LEFT EYE AT BEDTIME  1  . sildenafil (VIAGRA) 100 MG tablet Take 1/2-1 tablet by mouth 30 minutes prior to intercourse. 6 tablet 3  . traZODone (DESYREL) 50 MG tablet Take 1 tablet (50 mg total) by mouth at bedtime as needed. 90 tablet 1  . clonazePAM (KLONOPIN) 0.5 MG tablet Take 0.5 mg by mouth 2 (two) times daily as needed for anxiety.    . furosemide (LASIX) 20 MG tablet Take 1 tablet (20 mg total) by mouth daily as needed for edema. (Patient not taking: Reported on 10/17/2017) 30 tablet 3   No current facility-administered medications on file prior to visit.     No Known Allergies  Blood pressure (!) 144/72, pulse 98, weight  233 lb 12.8 oz (106.1 kg).    Essential hypertension Blood pressure above goal of 130/80 after decreasing amlodipine dose. Will start valsartan 80mg  daily and repeat BMET in 2 weeks. Patient is to follow weeks with DR Gwenlyn Found in 4 weeks. Plan to increase valsartan to 160mg  if additional BP control needed. Will be appropriate to discontinue furosemide PRN dose during next OV if edema resolved.  Presly Steinruck Rodriguez-Guzman PharmD, BCPS, [place] [address] 10/17/2017 6:03 PM

## 2017-10-31 DIAGNOSIS — D692 Other nonthrombocytopenic purpura: Secondary | ICD-10-CM | POA: Diagnosis not present

## 2017-10-31 DIAGNOSIS — D2272 Melanocytic nevi of left lower limb, including hip: Secondary | ICD-10-CM | POA: Diagnosis not present

## 2017-10-31 DIAGNOSIS — D225 Melanocytic nevi of trunk: Secondary | ICD-10-CM | POA: Diagnosis not present

## 2017-10-31 DIAGNOSIS — D0462 Carcinoma in situ of skin of left upper limb, including shoulder: Secondary | ICD-10-CM | POA: Diagnosis not present

## 2017-10-31 DIAGNOSIS — C44619 Basal cell carcinoma of skin of left upper limb, including shoulder: Secondary | ICD-10-CM | POA: Diagnosis not present

## 2017-11-02 ENCOUNTER — Other Ambulatory Visit: Payer: Self-pay | Admitting: Cardiovascular Disease

## 2017-11-02 DIAGNOSIS — I10 Essential (primary) hypertension: Secondary | ICD-10-CM

## 2017-11-02 NOTE — Telephone Encounter (Signed)
Rx sent to pharmacy   

## 2017-11-10 ENCOUNTER — Other Ambulatory Visit: Payer: Self-pay | Admitting: Cardiovascular Disease

## 2017-11-10 DIAGNOSIS — C44619 Basal cell carcinoma of skin of left upper limb, including shoulder: Secondary | ICD-10-CM | POA: Diagnosis not present

## 2017-11-10 DIAGNOSIS — Z85828 Personal history of other malignant neoplasm of skin: Secondary | ICD-10-CM | POA: Diagnosis not present

## 2017-11-10 NOTE — Telephone Encounter (Signed)
Rx request sent to pharmacy.  

## 2017-11-16 ENCOUNTER — Ambulatory Visit: Payer: Medicare HMO | Admitting: Cardiovascular Disease

## 2017-11-16 ENCOUNTER — Encounter: Payer: Self-pay | Admitting: Cardiovascular Disease

## 2017-11-16 DIAGNOSIS — R42 Dizziness and giddiness: Secondary | ICD-10-CM | POA: Insufficient documentation

## 2017-11-16 NOTE — Assessment & Plan Note (Signed)
Devin Howell returns today for follow-up of his 2D echo which was entirely normal.  He was being seen for evaluation of orthostasis.  Since I saw him last I recommended that he change positions slowly has not had any more dizziness.  No further evaluation is required at this time.

## 2017-11-16 NOTE — Patient Instructions (Signed)
Your physician recommends that you schedule a follow-up appointment in: AS NEEDED  

## 2017-11-16 NOTE — Progress Notes (Signed)
Mr. Asbridge returns today for follow-up of his 2D echo which was entirely normal.  He was being seen for evaluation of orthostasis.  Since I saw him last I recommended that he change positions slowly has not had any more dizziness.  No further evaluation is required at this time.  Lorretta Harp, M.D., [place], [facility], [place] [address] [place], [place]  [address]  [phone] 11/16/2017 1:49 PM

## 2017-11-24 DIAGNOSIS — R69 Illness, unspecified: Secondary | ICD-10-CM | POA: Diagnosis not present

## 2017-11-24 DIAGNOSIS — F331 Major depressive disorder, recurrent, moderate: Secondary | ICD-10-CM | POA: Diagnosis not present

## 2017-11-25 DIAGNOSIS — R69 Illness, unspecified: Secondary | ICD-10-CM | POA: Diagnosis not present

## 2017-11-29 ENCOUNTER — Other Ambulatory Visit: Payer: Self-pay | Admitting: Cardiovascular Disease

## 2017-11-29 DIAGNOSIS — I10 Essential (primary) hypertension: Secondary | ICD-10-CM

## 2017-11-29 NOTE — Telephone Encounter (Signed)
Rx sent to pharmacy   

## 2017-12-02 ENCOUNTER — Other Ambulatory Visit: Payer: Self-pay | Admitting: Cardiovascular Disease

## 2017-12-17 ENCOUNTER — Other Ambulatory Visit: Payer: Self-pay | Admitting: Primary Care

## 2017-12-17 DIAGNOSIS — F32A Depression, unspecified: Secondary | ICD-10-CM

## 2017-12-17 DIAGNOSIS — F329 Major depressive disorder, single episode, unspecified: Secondary | ICD-10-CM

## 2017-12-17 DIAGNOSIS — F419 Anxiety disorder, unspecified: Principal | ICD-10-CM

## 2018-01-20 ENCOUNTER — Ambulatory Visit (INDEPENDENT_AMBULATORY_CARE_PROVIDER_SITE_OTHER): Payer: Medicare HMO | Admitting: Primary Care

## 2018-01-20 VITALS — BP 130/80 | HR 90 | Temp 98.3°F | Ht 68.0 in | Wt 245.8 lb

## 2018-01-20 DIAGNOSIS — N529 Male erectile dysfunction, unspecified: Secondary | ICD-10-CM | POA: Diagnosis not present

## 2018-01-20 DIAGNOSIS — Z0000 Encounter for general adult medical examination without abnormal findings: Secondary | ICD-10-CM

## 2018-01-20 DIAGNOSIS — I10 Essential (primary) hypertension: Secondary | ICD-10-CM

## 2018-01-20 DIAGNOSIS — F329 Major depressive disorder, single episode, unspecified: Secondary | ICD-10-CM

## 2018-01-20 DIAGNOSIS — E349 Endocrine disorder, unspecified: Secondary | ICD-10-CM | POA: Diagnosis not present

## 2018-01-20 DIAGNOSIS — R69 Illness, unspecified: Secondary | ICD-10-CM | POA: Diagnosis not present

## 2018-01-20 DIAGNOSIS — F419 Anxiety disorder, unspecified: Secondary | ICD-10-CM

## 2018-01-20 DIAGNOSIS — M1A9XX0 Chronic gout, unspecified, without tophus (tophi): Secondary | ICD-10-CM | POA: Diagnosis not present

## 2018-01-20 DIAGNOSIS — E039 Hypothyroidism, unspecified: Secondary | ICD-10-CM

## 2018-01-20 DIAGNOSIS — R066 Hiccough: Secondary | ICD-10-CM

## 2018-01-20 DIAGNOSIS — R739 Hyperglycemia, unspecified: Secondary | ICD-10-CM | POA: Diagnosis not present

## 2018-01-20 DIAGNOSIS — E7800 Pure hypercholesterolemia, unspecified: Secondary | ICD-10-CM

## 2018-01-20 DIAGNOSIS — F3342 Major depressive disorder, recurrent, in full remission: Secondary | ICD-10-CM

## 2018-01-20 DIAGNOSIS — N189 Chronic kidney disease, unspecified: Secondary | ICD-10-CM

## 2018-01-20 DIAGNOSIS — A6000 Herpesviral infection of urogenital system, unspecified: Secondary | ICD-10-CM

## 2018-01-20 DIAGNOSIS — F32A Depression, unspecified: Secondary | ICD-10-CM

## 2018-01-20 MED ORDER — AMLODIPINE BESYLATE 2.5 MG PO TABS: 2.5000 mg | ORAL_TABLET | Freq: Every day | ORAL | 3 refills | Status: DC

## 2018-01-20 MED ORDER — DULOXETINE HCL 30 MG PO CPEP: 30.0000 mg | ORAL_CAPSULE | Freq: Every day | ORAL | 3 refills | Status: DC

## 2018-01-20 MED ORDER — SILDENAFIL CITRATE 100 MG PO TABS: ORAL_TABLET | ORAL | 3 refills | Status: DC

## 2018-01-20 NOTE — Addendum Note (Signed)
Addended by: Lendon Collar on: 01/20/2018 04:51 PM   Modules accepted: Orders

## 2018-01-20 NOTE — Progress Notes (Signed)
Subjective:    Patient ID: Devin Howell, male    DOB: 1951-03-26, 67 y.o.   MRN: [id number]  HPI  Devin Howell is a 67 year old male who presents today for complete physical.  Immunizations: -Tetanus: Completed in 2018 -Influenza: Due today -Pneumonia: Completed last in 2018 -Shingles: Completed 2 doses of Shingrix in 2018  Diet: He endorses a fair diet. Breakfast: Eggs, sausage, toast Lunch: Skips Dinner: Meat, vegetable, starch Snacks: None Desserts: Occasionally  Beverages: Water, un-sweet tea, alcohol   Exercise: He is walking 3-4 days weekly for one hour. Eye exam: Up to date Dental exam: Completes four times annually  Colonoscopy: Completed in 2019 PSA: Normal in 2018  Hep C Screen: Negative in 2018  BP Readings from Last 3 Encounters:  01/20/18 130/80  11/16/17 122/68  10/17/17 (!) 144/72     Review of Systems  Constitutional: Negative for unexpected weight change.  HENT: Negative for rhinorrhea.   Respiratory: Negative for cough and shortness of breath.   Cardiovascular: Negative for chest pain.  Gastrointestinal: Negative for constipation and diarrhea.  Genitourinary: Negative for difficulty urinating.  Musculoskeletal: Negative for arthralgias and myalgias.  Skin: Negative for rash.  Allergic/Immunologic: Negative for environmental allergies.  Neurological: Negative for dizziness, numbness and headaches.  Psychiatric/Behavioral:       Feels well managed on current regimen       Past Medical History:  Diagnosis Date  . Cataract   . Chronic gout   . CKD (chronic kidney disease)    pt. denies  . Erectile dysfunction   . GERD (gastroesophageal reflux disease)   . Herpes simplex   . Hx of adenomatous polyp of colon 04/12/2017  . Hyperlipidemia    pt. denies  . Hypertension    pt. denies  . OSA on CPAP   . Sleep apnea    cpap  . Testosterone deficiency      Social History   Socioeconomic History  . Marital status: Married   Spouse name: Not on file  . Number of children: Not on file  . Years of education: Not on file  . Highest education level: Not on file  Occupational History  . Not on file  Social Needs  . Financial resource strain: Not on file  . Food insecurity:    Worry: Not on file    Inability: Not on file  . Transportation needs:    Medical: Not on file    Non-medical: Not on file  Tobacco Use  . Smoking status: Never Smoker  . Smokeless tobacco: Never Used  Substance and Sexual Activity  . Alcohol use: Yes    Alcohol/week: 2.0 standard drinks    Types: 2 Glasses of wine per week  . Drug use: No  . Sexual activity: Not on file  Lifestyle  . Physical activity:    Days per week: Not on file    Minutes per session: Not on file  . Stress: Not on file  Relationships  . Social connections:    Talks on phone: Not on file    Gets together: Not on file    Attends religious service: Not on file    Active member of club or organization: Not on file    Attends meetings of clubs or organizations: Not on file    Relationship status: Not on file  . Intimate partner violence:    Fear of current or ex partner: Not on file    Emotionally abused: Not on file  Physically abused: Not on file    Forced sexual activity: Not on file  Other Topics Concern  . Not on file  Social History Narrative  . Not on file    Past Surgical History:  Procedure Laterality Date  . COLONOSCOPY    . TONSILLECTOMY AND ADENOIDECTOMY  1960    Family History  Problem Relation Age of Onset  . Rectal cancer Mother   . Colon polyps Neg Hx   . Esophageal cancer Neg Hx   . Colon cancer Neg Hx   . Stomach cancer Neg Hx     No Known Allergies  Current Outpatient Medications on File Prior to Visit  Medication Sig Dispense Refill  . allopurinol (ZYLOPRIM) 300 MG tablet Take 1 tablet (300 mg total) by mouth daily. 90 tablet 3  . atorvastatin (LIPITOR) 20 MG tablet Take 1 tablet (20 mg total) by mouth every evening.  90 tablet 2  . clobetasol ointment (TEMOVATE) 7.61 % Apply 1 application topically as needed (for ecsema).     Marland Kitchen levothyroxine (SYNTHROID, LEVOTHROID) 50 MCG tablet Take 1 tablet (50 mcg total) by mouth daily. 90 tablet 0  . traZODone (DESYREL) 50 MG tablet TAKE 1 TABLET BY MOUTH AT BEDTIME AS NEEDED 90 tablet 1  . valsartan (DIOVAN) 80 MG tablet TAKE 1 TABLET BY MOUTH EVERY DAY 30 tablet 9   No current facility-administered medications on file prior to visit.     BP 130/80   Pulse 90   Temp 98.3 F (36.8 C) (Oral)   Ht 5\' 8"  (1.727 m)   Wt 245 lb 12 oz (111.5 kg)   SpO2 98%   BMI 37.37 kg/m    Objective:   Physical Exam  Constitutional: He is oriented to person, place, and time. He appears well-nourished.  HENT:  Mouth/Throat: No oropharyngeal exudate.  Eyes: Pupils are equal, round, and reactive to light. EOM are normal.  Neck: Neck supple. No thyromegaly present.  Cardiovascular: Normal rate and regular rhythm.  Respiratory: Effort normal and breath sounds normal.  GI: Soft. Bowel sounds are normal. There is no tenderness.  Musculoskeletal: Normal range of motion.  Neurological: He is alert and oriented to person, place, and time.  Skin: Skin is warm and dry.  Psychiatric: He has a normal mood and affect.           Assessment & Plan:

## 2018-01-20 NOTE — Assessment & Plan Note (Signed)
Immunizations UTD. PSA UTD. Colonoscopy UTD. Discussed the importance of a healthy diet and regular exercise in order for weight loss, and to reduce the risk of any potential medical problems. Exam unremarkable  Labs pending. Follow up in 1 year for CPE.

## 2018-01-20 NOTE — Assessment & Plan Note (Signed)
Doing well on duloxetine 30 mg and trazodone 50 mg, continue same. No longer on clonazepam.

## 2018-01-20 NOTE — Assessment & Plan Note (Signed)
No recent hiccup flare. Continue to monitor.

## 2018-01-20 NOTE — Assessment & Plan Note (Signed)
No recent outbreaks. ? ?Continue to monitor. ?

## 2018-01-20 NOTE — Patient Instructions (Signed)
Continue to work on a healthy diet.   Ensure you are consuming 64 ounces of water daily.  Continue exercising. You should be getting 150 minutes of moderate intensity exercise weekly.  I sent refills for the medications you requested to your pharmacy.  We will see you in one year for your annual exam or sooner if needed.  It was a pleasure to see you today!   Preventive Care 33 Years and Older, Male Preventive care refers to lifestyle choices and visits with your health care provider that can promote health and wellness. What does preventive care include?  A yearly physical exam. This is also called an annual well check.  Dental exams once or twice a year.  Routine eye exams. Ask your health care provider how often you should have your eyes checked.  Personal lifestyle choices, including: ? Daily care of your teeth and gums. ? Regular physical activity. ? Eating a healthy diet. ? Avoiding tobacco and drug use. ? Limiting alcohol use. ? Practicing safe sex. ? Taking low doses of aspirin every day. ? Taking vitamin and mineral supplements as recommended by your health care provider. What happens during an annual well check? The services and screenings done by your health care provider during your annual well check will depend on your age, overall health, lifestyle risk factors, and family history of disease. Counseling Your health care provider may ask you questions about your:  Alcohol use.  Tobacco use.  Drug use.  Emotional well-being.  Home and relationship well-being.  Sexual activity.  Eating habits.  History of falls.  Memory and ability to understand (cognition).  Work and work Statistician.  Screening You may have the following tests or measurements:  Height, weight, and BMI.  Blood pressure.  Lipid and cholesterol levels. These may be checked every 5 years, or more frequently if you are over 54 years old.  Skin check.  Lung cancer screening.  You may have this screening every year starting at age 90 if you have a 30-pack-year history of smoking and currently smoke or have quit within the past 15 years.  Fecal occult blood test (FOBT) of the stool. You may have this test every year starting at age 71.  Flexible sigmoidoscopy or colonoscopy. You may have a sigmoidoscopy every 5 years or a colonoscopy every 10 years starting at age 2.  Prostate cancer screening. Recommendations will vary depending on your family history and other risks.  Hepatitis C blood test.  Hepatitis B blood test.  Sexually transmitted disease (STD) testing.  Diabetes screening. This is done by checking your blood sugar (glucose) after you have not eaten for a while (fasting). You may have this done every 1-3 years.  Abdominal aortic aneurysm (AAA) screening. You may need this if you are a current or former smoker.  Osteoporosis. You may be screened starting at age 61 if you are at high risk.  Talk with your health care provider about your test results, treatment options, and if necessary, the need for more tests. Vaccines Your health care provider may recommend certain vaccines, such as:  Influenza vaccine. This is recommended every year.  Tetanus, diphtheria, and acellular pertussis (Tdap, Td) vaccine. You may need a Td booster every 10 years.  Varicella vaccine. You may need this if you have not been vaccinated.  Zoster vaccine. You may need this after age 78.  Measles, mumps, and rubella (MMR) vaccine. You may need at least one dose of MMR if you were born  in 1957 or later. You may also need a second dose.  Pneumococcal 13-valent conjugate (PCV13) vaccine. One dose is recommended after age 35.  Pneumococcal polysaccharide (PPSV23) vaccine. One dose is recommended after age 14.  Meningococcal vaccine. You may need this if you have certain conditions.  Hepatitis A vaccine. You may need this if you have certain conditions or if you travel or  work in places where you may be exposed to hepatitis A.  Hepatitis B vaccine. You may need this if you have certain conditions or if you travel or work in places where you may be exposed to hepatitis B.  Haemophilus influenzae type b (Hib) vaccine. You may need this if you have certain risk factors.  Talk to your health care provider about which screenings and vaccines you need and how often you need them. This information is not intended to replace advice given to you by your health care provider. Make sure you discuss any questions you have with your health care provider. Document Released: 04/11/2015 Document Revised: 12/03/2015 Document Reviewed: 01/14/2015 Elsevier Interactive Patient Education  [employer].

## 2018-01-20 NOTE — Assessment & Plan Note (Signed)
Repeat lipids pending, continue atorvastatin. 

## 2018-01-20 NOTE — Assessment & Plan Note (Signed)
Following with Urology, next visit in one month.

## 2018-01-20 NOTE — Assessment & Plan Note (Addendum)
No recent gout flares, continue allopurinol 300 mg daily. Uric acid level pending.

## 2018-01-20 NOTE — Assessment & Plan Note (Signed)
Doing well on duloxetine 30 mg and trazodone 50 mg, continue same. Denies SI/HI.

## 2018-01-20 NOTE — Assessment & Plan Note (Signed)
Repeat renal function pending. Following with nephrology and reports that everything is stable. Continue to avoid nephrotoxic agents, continue ARB.

## 2018-01-20 NOTE — Assessment & Plan Note (Signed)
Stable in the office today, continue amlodipine and valsartan. Discussed to continue to work on diet and exercise.

## 2018-01-20 NOTE — Assessment & Plan Note (Signed)
Stable on PRN sildenafil, refill provided today.

## 2018-01-21 LAB — LIPID PANEL
Cholesterol: 206 mg/dL — ABNORMAL HIGH (ref <200)
HDL: 102 mg/dL (ref >40)
LDL CHOLESTEROL (CALC): 87 mg/dL
Non-HDL Cholesterol (Calc): 104 mg/dL (calc) (ref <130)
Total CHOL/HDL Ratio: 2 (calc) (ref <5.0)
Triglycerides: 81 mg/dL (ref <150)

## 2018-01-21 LAB — COMPREHENSIVE METABOLIC PANEL
AG RATIO: 1.4 (calc) (ref 1.0–2.5)
ALBUMIN MSPROF: 4.6 g/dL (ref 3.6–5.1)
ALT: 27 U/L (ref 9–46)
AST: 29 U/L (ref 10–35)
Alkaline phosphatase (APISO): 81 U/L (ref 40–115)
BILIRUBIN TOTAL: 0.8 mg/dL (ref 0.2–1.2)
BUN/Creatinine Ratio: 14 (calc) (ref 6–22)
BUN: 41 mg/dL — AB (ref 7–25)
CALCIUM: 10.4 mg/dL — AB (ref 8.6–10.3)
CHLORIDE: 102 mmol/L (ref 98–110)
CO2: 19 mmol/L — ABNORMAL LOW (ref 20–32)
Creat: 2.94 mg/dL — ABNORMAL HIGH (ref 0.70–1.25)
GLOBULIN: 3.4 g/dL (ref 1.9–3.7)
Glucose, Bld: 95 mg/dL (ref 65–99)
Potassium: 5 mmol/L (ref 3.5–5.3)
SODIUM: 141 mmol/L (ref 135–146)
TOTAL PROTEIN: 8 g/dL (ref 6.1–8.1)

## 2018-01-21 LAB — URIC ACID: Uric Acid, Serum: 3.3 mg/dL — ABNORMAL LOW (ref 4.0–8.0)

## 2018-01-21 LAB — TSH: TSH: 8.31 m[IU]/L — AB (ref 0.40–4.50)

## 2018-01-21 LAB — HEMOGLOBIN A1C
EAG (MMOL/L): 5.8 (calc)
HEMOGLOBIN A1C: 5.3 %{Hb} (ref <5.7)
MEAN PLASMA GLUCOSE: 105 (calc)

## 2018-01-24 ENCOUNTER — Other Ambulatory Visit: Payer: Self-pay | Admitting: Family Medicine

## 2018-01-24 DIAGNOSIS — E039 Hypothyroidism, unspecified: Secondary | ICD-10-CM

## 2018-01-25 DIAGNOSIS — G4733 Obstructive sleep apnea (adult) (pediatric): Secondary | ICD-10-CM | POA: Diagnosis not present

## 2018-02-01 DIAGNOSIS — E291 Testicular hypofunction: Secondary | ICD-10-CM | POA: Diagnosis not present

## 2018-02-08 DIAGNOSIS — E291 Testicular hypofunction: Secondary | ICD-10-CM | POA: Diagnosis not present

## 2018-02-08 DIAGNOSIS — N5201 Erectile dysfunction due to arterial insufficiency: Secondary | ICD-10-CM | POA: Diagnosis not present

## 2018-02-22 DIAGNOSIS — F331 Major depressive disorder, recurrent, moderate: Secondary | ICD-10-CM | POA: Diagnosis not present

## 2018-02-22 DIAGNOSIS — R69 Illness, unspecified: Secondary | ICD-10-CM | POA: Diagnosis not present

## 2018-03-01 DIAGNOSIS — G4733 Obstructive sleep apnea (adult) (pediatric): Secondary | ICD-10-CM | POA: Diagnosis not present

## 2018-03-09 DIAGNOSIS — R3911 Hesitancy of micturition: Secondary | ICD-10-CM | POA: Diagnosis not present

## 2018-03-09 DIAGNOSIS — N183 Chronic kidney disease, stage 3 (moderate): Secondary | ICD-10-CM | POA: Diagnosis not present

## 2018-03-09 DIAGNOSIS — I129 Hypertensive chronic kidney disease with stage 1 through stage 4 chronic kidney disease, or unspecified chronic kidney disease: Secondary | ICD-10-CM | POA: Diagnosis not present

## 2018-03-09 DIAGNOSIS — N200 Calculus of kidney: Secondary | ICD-10-CM | POA: Diagnosis not present

## 2018-03-09 DIAGNOSIS — N179 Acute kidney failure, unspecified: Secondary | ICD-10-CM | POA: Diagnosis not present

## 2018-03-09 DIAGNOSIS — R809 Proteinuria, unspecified: Secondary | ICD-10-CM | POA: Diagnosis not present

## 2018-03-14 ENCOUNTER — Other Ambulatory Visit: Payer: Self-pay | Admitting: Nephrology

## 2018-03-14 DIAGNOSIS — N183 Chronic kidney disease, stage 3 unspecified: Secondary | ICD-10-CM

## 2018-03-16 ENCOUNTER — Ambulatory Visit
Admission: RE | Admit: 2018-03-16 | Discharge: 2018-03-16 | Disposition: A | Discharge status: Home / Self Care | Payer: Medicare HMO | Source: Ambulatory Visit | Attending: Nephrology | Admitting: Nephrology

## 2018-03-16 DIAGNOSIS — N189 Chronic kidney disease, unspecified: Secondary | ICD-10-CM | POA: Diagnosis not present

## 2018-03-16 DIAGNOSIS — N183 Chronic kidney disease, stage 3 unspecified: Secondary | ICD-10-CM

## 2018-03-20 ENCOUNTER — Other Ambulatory Visit: Payer: Self-pay | Admitting: Primary Care

## 2018-03-20 DIAGNOSIS — E782 Mixed hyperlipidemia: Secondary | ICD-10-CM

## 2018-03-21 ENCOUNTER — Other Ambulatory Visit: Payer: Self-pay | Admitting: Primary Care

## 2018-03-21 ENCOUNTER — Other Ambulatory Visit: Payer: Self-pay | Admitting: Nephrology

## 2018-03-21 DIAGNOSIS — I129 Hypertensive chronic kidney disease with stage 1 through stage 4 chronic kidney disease, or unspecified chronic kidney disease: Secondary | ICD-10-CM

## 2018-03-21 DIAGNOSIS — N179 Acute kidney failure, unspecified: Secondary | ICD-10-CM

## 2018-03-21 DIAGNOSIS — R0602 Shortness of breath: Secondary | ICD-10-CM

## 2018-03-30 ENCOUNTER — Ambulatory Visit
Admission: RE | Admit: 2018-03-30 | Discharge: 2018-03-30 | Disposition: A | Discharge status: Home / Self Care | Payer: Medicare HMO | Source: Ambulatory Visit | Attending: Nephrology | Admitting: Nephrology

## 2018-03-30 DIAGNOSIS — I129 Hypertensive chronic kidney disease with stage 1 through stage 4 chronic kidney disease, or unspecified chronic kidney disease: Secondary | ICD-10-CM

## 2018-03-30 DIAGNOSIS — N179 Acute kidney failure, unspecified: Secondary | ICD-10-CM | POA: Diagnosis not present

## 2018-04-14 DIAGNOSIS — R3911 Hesitancy of micturition: Secondary | ICD-10-CM | POA: Diagnosis not present

## 2018-04-14 DIAGNOSIS — N183 Chronic kidney disease, stage 3 (moderate): Secondary | ICD-10-CM | POA: Diagnosis not present

## 2018-04-14 DIAGNOSIS — N200 Calculus of kidney: Secondary | ICD-10-CM | POA: Diagnosis not present

## 2018-04-14 DIAGNOSIS — R809 Proteinuria, unspecified: Secondary | ICD-10-CM | POA: Diagnosis not present

## 2018-04-14 DIAGNOSIS — I129 Hypertensive chronic kidney disease with stage 1 through stage 4 chronic kidney disease, or unspecified chronic kidney disease: Secondary | ICD-10-CM | POA: Diagnosis not present

## 2018-04-16 ENCOUNTER — Other Ambulatory Visit: Payer: Self-pay | Admitting: Family Medicine

## 2018-04-16 DIAGNOSIS — E039 Hypothyroidism, unspecified: Secondary | ICD-10-CM

## 2018-04-17 NOTE — Telephone Encounter (Signed)
Please call patient.  His thyroid function was uncontrolled during his last check, I sent him a message about this via my chart and never heard back..  Is he taking his levothyroxine every morning with water only? No food or other medications for 30 minutes? He needs repeat TSH for further refills. Is he out of his medication?

## 2018-04-17 NOTE — Telephone Encounter (Signed)
Last prescribed on 01/20/2018 by Debbie . Last office visit on 01/20/2018. No future appointment

## 2018-04-18 NOTE — Telephone Encounter (Signed)
Message left for patient to return my call.  

## 2018-04-20 NOTE — Telephone Encounter (Signed)
Are we sending this to a different pharmacy since he is out of town or does he not need a refill at this time?

## 2018-04-20 NOTE — Telephone Encounter (Signed)
Noted,refill sent

## 2018-04-20 NOTE — Telephone Encounter (Signed)
Spoken to patient and he stated he is taking Rx with just water and wait 30 minutes for everything else. Patient is out of town for next couple weeks. He did make a lab appointment on 05/04/2018.

## 2018-04-20 NOTE — Telephone Encounter (Signed)
Patient stated that he still have some but may be out by the time before he gets back. So please send refill to the [facility] in [employer] so he will have enough just in case.

## 2018-04-28 ENCOUNTER — Other Ambulatory Visit: Payer: Self-pay | Admitting: Primary Care

## 2018-04-28 DIAGNOSIS — E039 Hypothyroidism, unspecified: Secondary | ICD-10-CM

## 2018-05-03 DIAGNOSIS — G4733 Obstructive sleep apnea (adult) (pediatric): Secondary | ICD-10-CM | POA: Diagnosis not present

## 2018-05-04 ENCOUNTER — Other Ambulatory Visit (INDEPENDENT_AMBULATORY_CARE_PROVIDER_SITE_OTHER): Payer: Medicare HMO

## 2018-05-04 DIAGNOSIS — E039 Hypothyroidism, unspecified: Secondary | ICD-10-CM | POA: Diagnosis not present

## 2018-05-04 LAB — TSH: TSH: 3.13 u[IU]/mL (ref 0.35–4.50)

## 2018-05-05 ENCOUNTER — Other Ambulatory Visit: Payer: Self-pay | Admitting: Primary Care

## 2018-05-05 DIAGNOSIS — E039 Hypothyroidism, unspecified: Secondary | ICD-10-CM

## 2018-05-05 MED ORDER — LEVOTHYROXINE SODIUM 50 MCG PO TABS: ORAL_TABLET | ORAL | 2 refills | Status: DC

## 2018-06-18 ENCOUNTER — Other Ambulatory Visit: Payer: Self-pay | Admitting: Primary Care

## 2018-06-18 DIAGNOSIS — F32A Depression, unspecified: Secondary | ICD-10-CM

## 2018-06-18 DIAGNOSIS — F329 Major depressive disorder, single episode, unspecified: Secondary | ICD-10-CM

## 2018-06-18 DIAGNOSIS — F419 Anxiety disorder, unspecified: Principal | ICD-10-CM

## 2018-06-21 ENCOUNTER — Other Ambulatory Visit: Payer: Self-pay | Admitting: Primary Care

## 2018-06-21 DIAGNOSIS — M1A9XX0 Chronic gout, unspecified, without tophus (tophi): Secondary | ICD-10-CM

## 2018-06-21 NOTE — Telephone Encounter (Signed)
Last prescribed on 06/13/2017 . Last office visit on 01/20/2018. No future appointment

## 2018-06-21 NOTE — Telephone Encounter (Signed)
Noted, refill sent to pharmacy. 

## 2018-06-30 DIAGNOSIS — L080 Pyoderma: Secondary | ICD-10-CM | POA: Diagnosis not present

## 2018-06-30 DIAGNOSIS — D485 Neoplasm of uncertain behavior of skin: Secondary | ICD-10-CM | POA: Diagnosis not present

## 2018-06-30 DIAGNOSIS — Z85828 Personal history of other malignant neoplasm of skin: Secondary | ICD-10-CM | POA: Diagnosis not present

## 2018-06-30 DIAGNOSIS — L570 Actinic keratosis: Secondary | ICD-10-CM | POA: Diagnosis not present

## 2018-08-02 DIAGNOSIS — R948 Abnormal results of function studies of other organs and systems: Secondary | ICD-10-CM | POA: Diagnosis not present

## 2018-08-02 DIAGNOSIS — E291 Testicular hypofunction: Secondary | ICD-10-CM | POA: Diagnosis not present

## 2018-08-09 DIAGNOSIS — N5201 Erectile dysfunction due to arterial insufficiency: Secondary | ICD-10-CM | POA: Diagnosis not present

## 2018-08-09 DIAGNOSIS — E291 Testicular hypofunction: Secondary | ICD-10-CM | POA: Diagnosis not present

## 2018-08-09 DIAGNOSIS — R351 Nocturia: Secondary | ICD-10-CM | POA: Diagnosis not present

## 2018-09-05 DIAGNOSIS — N183 Chronic kidney disease, stage 3 (moderate): Secondary | ICD-10-CM | POA: Diagnosis not present

## 2018-09-12 DIAGNOSIS — R809 Proteinuria, unspecified: Secondary | ICD-10-CM | POA: Diagnosis not present

## 2018-09-12 DIAGNOSIS — R3911 Hesitancy of micturition: Secondary | ICD-10-CM | POA: Diagnosis not present

## 2018-09-12 DIAGNOSIS — N183 Chronic kidney disease, stage 3 (moderate): Secondary | ICD-10-CM | POA: Diagnosis not present

## 2018-09-12 DIAGNOSIS — N200 Calculus of kidney: Secondary | ICD-10-CM | POA: Diagnosis not present

## 2018-09-12 DIAGNOSIS — I129 Hypertensive chronic kidney disease with stage 1 through stage 4 chronic kidney disease, or unspecified chronic kidney disease: Secondary | ICD-10-CM | POA: Diagnosis not present

## 2018-09-20 ENCOUNTER — Other Ambulatory Visit: Payer: Self-pay | Admitting: Primary Care

## 2018-09-20 DIAGNOSIS — E782 Mixed hyperlipidemia: Secondary | ICD-10-CM

## 2018-09-22 ENCOUNTER — Other Ambulatory Visit: Payer: Self-pay | Admitting: Cardiovascular Disease

## 2018-11-01 DIAGNOSIS — D2262 Melanocytic nevi of left upper limb, including shoulder: Secondary | ICD-10-CM | POA: Diagnosis not present

## 2018-11-01 DIAGNOSIS — L2089 Other atopic dermatitis: Secondary | ICD-10-CM | POA: Diagnosis not present

## 2018-11-01 DIAGNOSIS — D2261 Melanocytic nevi of right upper limb, including shoulder: Secondary | ICD-10-CM | POA: Diagnosis not present

## 2018-11-01 DIAGNOSIS — D225 Melanocytic nevi of trunk: Secondary | ICD-10-CM | POA: Diagnosis not present

## 2018-11-01 DIAGNOSIS — L738 Other specified follicular disorders: Secondary | ICD-10-CM | POA: Diagnosis not present

## 2018-11-01 DIAGNOSIS — D2272 Melanocytic nevi of left lower limb, including hip: Secondary | ICD-10-CM | POA: Diagnosis not present

## 2018-11-01 DIAGNOSIS — L814 Other melanin hyperpigmentation: Secondary | ICD-10-CM | POA: Diagnosis not present

## 2018-11-01 DIAGNOSIS — Z85828 Personal history of other malignant neoplasm of skin: Secondary | ICD-10-CM | POA: Diagnosis not present

## 2018-11-01 DIAGNOSIS — L821 Other seborrheic keratosis: Secondary | ICD-10-CM | POA: Diagnosis not present

## 2018-11-01 DIAGNOSIS — D692 Other nonthrombocytopenic purpura: Secondary | ICD-10-CM | POA: Diagnosis not present

## 2018-11-02 DIAGNOSIS — R69 Illness, unspecified: Secondary | ICD-10-CM | POA: Diagnosis not present

## 2018-12-07 ENCOUNTER — Other Ambulatory Visit: Payer: Self-pay | Admitting: Primary Care

## 2018-12-07 DIAGNOSIS — F329 Major depressive disorder, single episode, unspecified: Secondary | ICD-10-CM

## 2018-12-07 DIAGNOSIS — F419 Anxiety disorder, unspecified: Secondary | ICD-10-CM

## 2018-12-07 DIAGNOSIS — F32A Depression, unspecified: Secondary | ICD-10-CM

## 2019-01-17 DIAGNOSIS — M545 Low back pain: Secondary | ICD-10-CM | POA: Diagnosis not present

## 2019-01-19 DIAGNOSIS — N183 Chronic kidney disease, stage 3 unspecified: Secondary | ICD-10-CM | POA: Diagnosis not present

## 2019-01-25 DIAGNOSIS — R3911 Hesitancy of micturition: Secondary | ICD-10-CM | POA: Diagnosis not present

## 2019-01-25 DIAGNOSIS — N200 Calculus of kidney: Secondary | ICD-10-CM | POA: Diagnosis not present

## 2019-01-25 DIAGNOSIS — I129 Hypertensive chronic kidney disease with stage 1 through stage 4 chronic kidney disease, or unspecified chronic kidney disease: Secondary | ICD-10-CM | POA: Diagnosis not present

## 2019-01-25 DIAGNOSIS — N1832 Chronic kidney disease, stage 3b: Secondary | ICD-10-CM | POA: Diagnosis not present

## 2019-01-25 DIAGNOSIS — R809 Proteinuria, unspecified: Secondary | ICD-10-CM | POA: Diagnosis not present

## 2019-01-31 ENCOUNTER — Other Ambulatory Visit: Payer: Self-pay | Admitting: Primary Care

## 2019-01-31 DIAGNOSIS — E039 Hypothyroidism, unspecified: Secondary | ICD-10-CM

## 2019-02-06 ENCOUNTER — Other Ambulatory Visit: Payer: Self-pay | Admitting: Primary Care

## 2019-02-06 DIAGNOSIS — Z125 Encounter for screening for malignant neoplasm of prostate: Secondary | ICD-10-CM

## 2019-02-06 DIAGNOSIS — M1A9XX0 Chronic gout, unspecified, without tophus (tophi): Secondary | ICD-10-CM

## 2019-02-06 DIAGNOSIS — I10 Essential (primary) hypertension: Secondary | ICD-10-CM

## 2019-02-06 DIAGNOSIS — E7800 Pure hypercholesterolemia, unspecified: Secondary | ICD-10-CM

## 2019-02-06 DIAGNOSIS — E291 Testicular hypofunction: Secondary | ICD-10-CM | POA: Diagnosis not present

## 2019-02-08 ENCOUNTER — Ambulatory Visit (INDEPENDENT_AMBULATORY_CARE_PROVIDER_SITE_OTHER): Payer: Medicare HMO

## 2019-02-08 ENCOUNTER — Other Ambulatory Visit: Payer: Self-pay

## 2019-02-08 DIAGNOSIS — Z0000 Encounter for general adult medical examination without abnormal findings: Secondary | ICD-10-CM | POA: Diagnosis not present

## 2019-02-08 NOTE — Progress Notes (Signed)
PCP notes:  Health Maintenance: none   Abnormal Screenings: none   Patient concerns: none   Nurse concerns: none   Next PCP appt.: 02/13/2019 @ 2:40 pm

## 2019-02-08 NOTE — Patient Instructions (Signed)
Mr. Devin Howell , Thank you for taking time to come for your Medicare Wellness Visit. I appreciate your ongoing commitment to your health goals. Please review the following plan we discussed and let me know if I can assist you in the future.   Screening recommendations/referrals: Colonoscopy: Up to date, completed 04/08/2017 Recommended yearly ophthalmology/optometry visit for glaucoma screening and checkup Recommended yearly dental visit for hygiene and checkup  Vaccinations: Influenza vaccine: Up to date, completed 11/02/2018 Pneumococcal vaccine: Completed series Tdap vaccine: Up to date, completed 12/16/2016 Shingles vaccine: Completed series    Advanced directives: Please bring a copy of your POA (Power of Attorney) and/or Living Will to your next appointment.   Conditions/risks identified: hypertension, hyperlipidemia  Next appointment: 02/13/2019 @ 2:40 pm   Preventive Care 69 Years and Older, Male Preventive care refers to lifestyle choices and visits with your health care provider that can promote health and wellness. What does preventive care include?  A yearly physical exam. This is also called an annual well check.  Dental exams once or twice a year.  Routine eye exams. Ask your health care provider how often you should have your eyes checked.  Personal lifestyle choices, including:  Daily care of your teeth and gums.  Regular physical activity.  Eating a healthy diet.  Avoiding tobacco and drug use.  Limiting alcohol use.  Practicing safe sex.  Taking low doses of aspirin every day.  Taking vitamin and mineral supplements as recommended by your health care provider. What happens during an annual well check? The services and screenings done by your health care provider during your annual well check will depend on your age, overall health, lifestyle risk factors, and family history of disease. Counseling  Your health care provider may ask you questions about  your:  Alcohol use.  Tobacco use.  Drug use.  Emotional well-being.  Home and relationship well-being.  Sexual activity.  Eating habits.  History of falls.  Memory and ability to understand (cognition).  Work and work Statistician. Screening  You may have the following tests or measurements:  Height, weight, and BMI.  Blood pressure.  Lipid and cholesterol levels. These may be checked every 5 years, or more frequently if you are over 3 years old.  Skin check.  Lung cancer screening. You may have this screening every year starting at age 699 if you have a 30-pack-year history of smoking and currently smoke or have quit within the past 15 years.  Fecal occult blood test (FOBT) of the stool. You may have this test every year starting at age 699.  Flexible sigmoidoscopy or colonoscopy. You may have a sigmoidoscopy every 5 years or a colonoscopy every 10 years starting at age 699.  Prostate cancer screening. Recommendations will vary depending on your family history and other risks.  Hepatitis C blood test.  Hepatitis B blood test.  Sexually transmitted disease (STD) testing.  Diabetes screening. This is done by checking your blood sugar (glucose) after you have not eaten for a while (fasting). You may have this done every 1-3 years.  Abdominal aortic aneurysm (AAA) screening. You may need this if you are a current or former smoker.  Osteoporosis. You may be screened starting at age 69 if you are at high risk. Talk with your health care provider about your test results, treatment options, and if necessary, the need for more tests. Vaccines  Your health care provider may recommend certain vaccines, such as:  Influenza vaccine. This is recommended every year.  Tetanus, diphtheria, and acellular pertussis (Tdap, Td) vaccine. You may need a Td booster every 10 years.  Zoster vaccine. You may need this after age 69.  Pneumococcal 13-valent conjugate (PCV13) vaccine.  One dose is recommended after age 69.  Pneumococcal polysaccharide (PPSV23) vaccine. One dose is recommended after age 69. Talk to your health care provider about which screenings and vaccines you need and how often you need them. This information is not intended to replace advice given to you by your health care provider. Make sure you discuss any questions you have with your health care provider. Document Released: 04/11/2015 Document Revised: 12/03/2015 Document Reviewed: 01/14/2015 Elsevier Interactive Patient Education  2017 [place] Prevention in the Home Falls can cause injuries. They can happen to people of all ages. There are many things you can do to make your home safe and to help prevent falls. What can I do on the outside of my home?  Regularly fix the edges of walkways and driveways and fix any cracks.  Remove anything that might make you trip as you walk through a door, such as a raised step or threshold.  Trim any bushes or trees on the path to your home.  Use bright outdoor lighting.  Clear any walking paths of anything that might make someone trip, such as rocks or tools.  Regularly check to see if handrails are loose or broken. Make sure that both sides of any steps have handrails.  Any raised decks and porches should have guardrails on the edges.  Have any leaves, snow, or ice cleared regularly.  Use sand or salt on walking paths during winter.  Clean up any spills in your garage right away. This includes oil or grease spills. What can I do in the bathroom?  Use night lights.  Install grab bars by the toilet and in the tub and shower. Do not use towel bars as grab bars.  Use non-skid mats or decals in the tub or shower.  If you need to sit down in the shower, use a plastic, non-slip stool.  Keep the floor dry. Clean up any water that spills on the floor as soon as it happens.  Remove soap buildup in the tub or shower regularly.  Attach bath  mats securely with double-sided non-slip rug tape.  Do not have throw rugs and other things on the floor that can make you trip. What can I do in the bedroom?  Use night lights.  Make sure that you have a light by your bed that is easy to reach.  Do not use any sheets or blankets that are too big for your bed. They should not hang down onto the floor.  Have a firm chair that has side arms. You can use this for support while you get dressed.  Do not have throw rugs and other things on the floor that can make you trip. What can I do in the kitchen?  Clean up any spills right away.  Avoid walking on wet floors.  Keep items that you use a lot in easy-to-reach places.  If you need to reach something above you, use a strong step stool that has a grab bar.  Keep electrical cords out of the way.  Do not use floor polish or wax that makes floors slippery. If you must use wax, use non-skid floor wax.  Do not have throw rugs and other things on the floor that can make you trip. What can I do  with my stairs?  Do not leave any items on the stairs.  Make sure that there are handrails on both sides of the stairs and use them. Fix handrails that are broken or loose. Make sure that handrails are as long as the stairways.  Check any carpeting to make sure that it is firmly attached to the stairs. Fix any carpet that is loose or worn.  Avoid having throw rugs at the top or bottom of the stairs. If you do have throw rugs, attach them to the floor with carpet tape.  Make sure that you have a light switch at the top of the stairs and the bottom of the stairs. If you do not have them, ask someone to add them for you. What else can I do to help prevent falls?  Wear shoes that:  Do not have high heels.  Have rubber bottoms.  Are comfortable and fit you well.  Are closed at the toe. Do not wear sandals.  If you use a stepladder:  Make sure that it is fully opened. Do not climb a closed  stepladder.  Make sure that both sides of the stepladder are locked into place.  Ask someone to hold it for you, if possible.  Clearly mark and make sure that you can see:  Any grab bars or handrails.  First and last steps.  Where the edge of each step is.  Use tools that help you move around (mobility aids) if they are needed. These include:  Canes.  Walkers.  Scooters.  Crutches.  Turn on the lights when you go into a dark area. Replace any light bulbs as soon as they burn out.  Set up your furniture so you have a clear path. Avoid moving your furniture around.  If any of your floors are uneven, fix them.  If there are any pets around you, be aware of where they are.  Review your medicines with your doctor. Some medicines can make you feel dizzy. This can increase your chance of falling. Ask your doctor what other things that you can do to help prevent falls. This information is not intended to replace advice given to you by your health care provider. Make sure you discuss any questions you have with your health care provider. Document Released: 01/09/2009 Document Revised: 08/21/2015 Document Reviewed: 04/19/2014 Elsevier Interactive Patient Education  2017 [employer].

## 2019-02-08 NOTE — Progress Notes (Signed)
Subjective:   Devin Howell is a 68 y.o. male who presents for Medicare Annual/Subsequent preventive examination.  Review of Systems: N/A   This visit is being conducted through telemedicine via telephone at the nurse health advisor's home address due to the COVID-19 pandemic. This patient has given me verbal consent via doximity to conduct this visit, patient states they are participating from their home address. Patient and myself are on the telephone call. There is no referral for this visit. Some vital signs may be absent or patient reported.    Patient identification: identified by name, DOB, and current address   Cardiac Risk Factors include: advanced age (>59men, >1 women);hypertension;dyslipidemia;male gender     Objective:    Vitals: There were no vitals taken for this visit.  There is no height or weight on file to calculate BMI.  Advanced Directives 02/08/2019 12/16/2016 05/09/2016  Does Patient Have a Medical Advance Directive? Yes Yes Yes  Type of Paramedic of [place];Living will [place];Living will [place];Living will  Does patient want to make changes to medical advance directive? - - No - Patient declined  Copy of [place] in Chart? No - copy requested No - copy requested No - copy requested    Tobacco Social History   Tobacco Use  Smoking Status Never Smoker  Smokeless Tobacco Never Used     Counseling given: Not Answered   Clinical Intake:  Pre-visit preparation completed: Yes  Pain : No/denies pain     Nutritional Risks: None Diabetes: No  How often do you need to have someone help you when you read instructions, pamphlets, or other written materials from your doctor or pharmacy?: 1 - Never What is the last grade level you completed in school?: college  Interpreter Needed?: No  Information entered by :: CJohnson, LPN  Past Medical History:  Diagnosis  Date  . Cataract   . Chronic gout   . CKD (chronic kidney disease)    pt. denies  . Erectile dysfunction   . GERD (gastroesophageal reflux disease)   . Herpes simplex   . Hx of adenomatous polyp of colon 04/12/2017  . Hyperlipidemia    pt. denies  . Hypertension    pt. denies  . OSA on CPAP   . Sleep apnea    cpap  . Testosterone deficiency    Past Surgical History:  Procedure Laterality Date  . COLONOSCOPY    . TONSILLECTOMY AND ADENOIDECTOMY  1960   Family History  Problem Relation Age of Onset  . Rectal cancer Mother   . Colon polyps Neg Hx   . Esophageal cancer Neg Hx   . Colon cancer Neg Hx   . Stomach cancer Neg Hx    Social History   Socioeconomic History  . Marital status: Married    Spouse name: Not on file  . Number of children: Not on file  . Years of education: Not on file  . Highest education level: Not on file  Occupational History  . Not on file  Social Needs  . Financial resource strain: Not hard at all  . Food insecurity    Worry: Never true    Inability: Never true  . Transportation needs    Medical: No    Non-medical: No  Tobacco Use  . Smoking status: Never Smoker  . Smokeless tobacco: Never Used  Substance and Sexual Activity  . Alcohol use: Yes    Alcohol/week:  2.0 standard drinks    Types: 2 Glasses of wine per week  . Drug use: No  . Sexual activity: Not on file  Lifestyle  . Physical activity    Days per week: 0 days    Minutes per session: 0 min  . Stress: Not at all  Relationships  . Social Herbalist on phone: Not on file    Gets together: Not on file    Attends religious service: Not on file    Active member of club or organization: Not on file    Attends meetings of clubs or organizations: Not on file    Relationship status: Not on file  Other Topics Concern  . Not on file  Social History Narrative  . Not on file    Outpatient Encounter Medications as of 02/08/2019  Medication Sig  . albuterol  (PROVENTIL HFA) 108 (90 Base) MCG/ACT inhaler INHALE 2 PUFFS INTO THE LUNGS EVERY 4-6 HOURS AS NEEDED FOR SHORTNESS OF BREATH.  Marland Kitchen allopurinol (ZYLOPRIM) 300 MG tablet Take 1 tablet (300 mg total) by mouth daily. For gout prevention.  Marland Kitchen amLODipine (NORVASC) 2.5 MG tablet Take 1 tablet (2.5 mg total) by mouth daily. For blood pressure.  Marland Kitchen atorvastatin (LIPITOR) 20 MG tablet TAKE 1 TABLET BY MOUTH EVERY DAY IN THE EVENING  . clobetasol ointment (TEMOVATE) AB-123456789 % Apply 1 application topically as needed (for ecsema).   . DULoxetine (CYMBALTA) 30 MG capsule Take 1 capsule (30 mg total) by mouth daily. For depression.  Marland Kitchen levothyroxine (SYNTHROID) 50 MCG tablet TAKE 1 TABLET BY MOUTH IN THE MORNING ON AN EMPTY STOMACH WITH WATER ONLY. (DO NOT EAT OR TAKE ANY OTHER MEDICATION 30MINS BEFORE)  . sildenafil (VIAGRA) 100 MG tablet Take 1/2-1 tablet by mouth 30 minutes prior to intercourse.  . traZODone (DESYREL) 50 MG tablet TAKE 1 TABLET BY MOUTH EVERY DAY AT BEDTIME AS NEEDED  . valsartan (DIOVAN) 80 MG tablet Take 1 tablet (80 mg total) by mouth daily. OFFICE VISIT NEEDED   No facility-administered encounter medications on file as of 02/08/2019.     Activities of Daily Living In your present state of health, do you have any difficulty performing the following activities: 02/08/2019  Hearing? N  Vision? N  Difficulty concentrating or making decisions? N  Walking or climbing stairs? N  Dressing or bathing? N  Doing errands, shopping? N  Preparing Food and eating ? N  Using the Toilet? N  In the past six months, have you accidently leaked urine? N  Do you have problems with loss of bowel control? N  Managing your Medications? N  Managing your Finances? N  Housekeeping or managing your Housekeeping? N  Some recent data might be hidden    Patient Care Team: Pleas Koch, NP as PCP - General (Internal Medicine)   Assessment:   This is a routine wellness examination for Devin Howell.  Exercise  Activities and Dietary recommendations Current Exercise Habits: Home exercise routine, Type of exercise: walking, Time (Minutes): 60, Frequency (Times/Week): 7, Weekly Exercise (Minutes/Week): 420, Intensity: Moderate, Exercise limited by: None identified  Goals    . Blood Pressure < 130/80    . Increase physical activity     Starting 12/16/2016, I will continue to walk at least 5 miles 4 days per week.     . Patient Stated     02/08/2019, I will maintain and continue medications as prescribed.        Fall Risk Fall  Risk  02/08/2019 12/16/2016  Falls in the past year? 0 No  Number falls in past yr: 0 -  Injury with Fall? 0 -  Risk for fall due to : Medication side effect -  Follow up Falls evaluation completed;Falls prevention discussed -   Is the patient's home free of loose throw rugs in walkways, pet beds, electrical cords, etc?   yes      Grab bars in the bathroom? yes      Handrails on the stairs?   yes      Adequate lighting?   yes  Timed Get Up and Go Performed: N/A  Depression Screen PHQ 2/9 Scores 02/08/2019 12/16/2016  PHQ - 2 Score 0 0  PHQ- 9 Score 0 0    Cognitive Function MMSE - Mini Mental State Exam 02/08/2019 12/16/2016  Orientation to time 5 5  Orientation to Place 5 5  Registration 3 3  Attention/ Calculation 5 0  Recall 3 3  Language- name 2 objects - 0  Language- repeat 1 1  Language- follow 3 step command - 3  Language- read & follow direction - 0  Write a sentence - 0  Copy design - 0  Total score - 20  Mini Cog  Mini-Cog screen was completed. Maximum score is 22. A value of 0 denotes this part of the MMSE was not completed or the patient failed this part of the Mini-Cog screening.       Immunization History  Administered Date(s) Administered  . Influenza, High Dose Seasonal PF 12/08/2016  . Influenza,inj,Quad PF,6+ Mos 11/02/2018  . Influenza-Unspecified 11/27/2017  . Pneumococcal Conjugate-13 02/12/2015  . Pneumococcal  Polysaccharide-23 12/16/2016  . Td 12/16/2016  . Zoster Recombinat (Shingrix) 10/14/2016, 03/13/2017    Qualifies for Shingles Vaccine? Completed series   Screening Tests Health Maintenance  Topic Date Due  . COLONOSCOPY  04/08/2022  . TETANUS/TDAP  12/17/2026  . INFLUENZA VACCINE  Completed  . Hepatitis C Screening  Completed  . PNA vac Low Risk Adult  Completed   Cancer Screenings: Lung: Low Dose CT Chest recommended if Age 58-80 years, 30 pack-year currently smoking OR have quit w/in 15years. Patient does not qualify. Colorectal: completed 04/08/2017  Additional Screenings:  Hepatitis C Screening: 12/16/2016      Plan:    Patient will maintain and continue medications as prescribed  I have personally reviewed and noted the following in the patient's chart:   . Medical and social history . Use of alcohol, tobacco or illicit drugs  . Current medications and supplements . Functional ability and status . Nutritional status . Physical activity . Advanced directives . List of other physicians . Hospitalizations, surgeries, and ER visits in previous 12 months . Vitals . Screenings to include cognitive, depression, and falls . Referrals and appointments  In addition, I have reviewed and discussed with patient certain preventive protocols, quality metrics, and best practice recommendations. A written personalized care plan for preventive services as well as general preventive health recommendations were provided to patient.     Andrez Grime, LPN  [phone]

## 2019-02-09 ENCOUNTER — Other Ambulatory Visit: Payer: Self-pay

## 2019-02-09 ENCOUNTER — Other Ambulatory Visit (INDEPENDENT_AMBULATORY_CARE_PROVIDER_SITE_OTHER): Payer: Medicare HMO

## 2019-02-09 DIAGNOSIS — I10 Essential (primary) hypertension: Secondary | ICD-10-CM | POA: Diagnosis not present

## 2019-02-09 DIAGNOSIS — Z125 Encounter for screening for malignant neoplasm of prostate: Secondary | ICD-10-CM

## 2019-02-09 DIAGNOSIS — M1A9XX0 Chronic gout, unspecified, without tophus (tophi): Secondary | ICD-10-CM | POA: Diagnosis not present

## 2019-02-09 DIAGNOSIS — E7800 Pure hypercholesterolemia, unspecified: Secondary | ICD-10-CM | POA: Diagnosis not present

## 2019-02-09 LAB — LIPID PANEL
Cholesterol: 207 mg/dL — ABNORMAL HIGH (ref 0–200)
HDL: 94.8 mg/dL (ref >39.00)
LDL Cholesterol: 101 mg/dL — ABNORMAL HIGH (ref 0–99)
NonHDL: 112.62
Total CHOL/HDL Ratio: 2
Triglycerides: 59 mg/dL (ref 0.0–149.0)
VLDL: 11.8 mg/dL (ref 0.0–40.0)

## 2019-02-09 LAB — COMPREHENSIVE METABOLIC PANEL
ALT: 21 U/L (ref 0–53)
AST: 19 U/L (ref 0–37)
Albumin: 4.5 g/dL (ref 3.5–5.2)
Alkaline Phosphatase: 95 U/L (ref 39–117)
BUN: 25 mg/dL — ABNORMAL HIGH (ref 6–23)
CO2: 33 mEq/L — ABNORMAL HIGH (ref 19–32)
Calcium: 10.4 mg/dL (ref 8.4–10.5)
Chloride: 101 mEq/L (ref 96–112)
Creatinine, Ser: 1.81 mg/dL — ABNORMAL HIGH (ref 0.40–1.50)
GFR: 37.27 mL/min — ABNORMAL LOW (ref >60.00)
Glucose, Bld: 123 mg/dL — ABNORMAL HIGH (ref 70–99)
Potassium: 4.7 mEq/L (ref 3.5–5.1)
Sodium: 142 mEq/L (ref 135–145)
Total Bilirubin: 0.8 mg/dL (ref 0.2–1.2)
Total Protein: 7.2 g/dL (ref 6.0–8.3)

## 2019-02-09 LAB — CBC
HCT: 46.2 % (ref 39.0–52.0)
Hemoglobin: 15.5 g/dL (ref 13.0–17.0)
MCHC: 33.5 g/dL (ref 30.0–36.0)
MCV: 101.1 fl — ABNORMAL HIGH (ref 78.0–100.0)
Platelets: 164 10*3/uL (ref 150.0–400.0)
RBC: 4.57 Mil/uL (ref 4.22–5.81)
RDW: 14.7 % (ref 11.5–15.5)
WBC: 6.8 10*3/uL (ref 4.0–10.5)

## 2019-02-09 LAB — URIC ACID: Uric Acid, Serum: 3.4 mg/dL — ABNORMAL LOW (ref 4.0–7.8)

## 2019-02-09 LAB — PSA, MEDICARE: PSA: 1.32 ng/ml (ref 0.10–4.00)

## 2019-02-13 ENCOUNTER — Ambulatory Visit: Payer: Medicare HMO | Admitting: Primary Care

## 2019-02-13 ENCOUNTER — Ambulatory Visit (INDEPENDENT_AMBULATORY_CARE_PROVIDER_SITE_OTHER): Payer: Medicare HMO | Admitting: Primary Care

## 2019-02-13 ENCOUNTER — Other Ambulatory Visit: Payer: Self-pay

## 2019-02-13 VITALS — BP 140/92 | HR 94 | Temp 97.7°F | Ht 68.0 in | Wt 240.2 lb

## 2019-02-13 DIAGNOSIS — E7800 Pure hypercholesterolemia, unspecified: Secondary | ICD-10-CM

## 2019-02-13 DIAGNOSIS — N529 Male erectile dysfunction, unspecified: Secondary | ICD-10-CM | POA: Diagnosis not present

## 2019-02-13 DIAGNOSIS — F419 Anxiety disorder, unspecified: Secondary | ICD-10-CM

## 2019-02-13 DIAGNOSIS — F32A Depression, unspecified: Secondary | ICD-10-CM

## 2019-02-13 DIAGNOSIS — N189 Chronic kidney disease, unspecified: Secondary | ICD-10-CM | POA: Diagnosis not present

## 2019-02-13 DIAGNOSIS — I10 Essential (primary) hypertension: Secondary | ICD-10-CM

## 2019-02-13 DIAGNOSIS — Z86010 Personal history of colonic polyps: Secondary | ICD-10-CM | POA: Diagnosis not present

## 2019-02-13 DIAGNOSIS — M1A9XX0 Chronic gout, unspecified, without tophus (tophi): Secondary | ICD-10-CM | POA: Diagnosis not present

## 2019-02-13 DIAGNOSIS — Z0000 Encounter for general adult medical examination without abnormal findings: Secondary | ICD-10-CM | POA: Diagnosis not present

## 2019-02-13 DIAGNOSIS — E349 Endocrine disorder, unspecified: Secondary | ICD-10-CM | POA: Diagnosis not present

## 2019-02-13 DIAGNOSIS — F329 Major depressive disorder, single episode, unspecified: Secondary | ICD-10-CM

## 2019-02-13 DIAGNOSIS — F3342 Major depressive disorder, recurrent, in full remission: Secondary | ICD-10-CM

## 2019-02-13 DIAGNOSIS — E039 Hypothyroidism, unspecified: Secondary | ICD-10-CM

## 2019-02-13 DIAGNOSIS — R69 Illness, unspecified: Secondary | ICD-10-CM | POA: Diagnosis not present

## 2019-02-13 NOTE — Progress Notes (Signed)
Subjective:    Patient ID: Devin Howell, male    DOB: [birth year], 68 y.o.   MRN: [id number]  HPI  Devin Howell is a 68 year old male who presents today for complete physical.  Immunizations: -Tetanus: Completed in 2018 -Influenza: Completed this season  -Shingles: Completed Shingrix -Pneumonia: Completed Prevnar and Pneumovax  Diet: He is eating a healthy diet. He is eating vegetables, fruit, whole grains, lean protein. Daily desserts. He is drinking water during day, alcohl at night.  Exercise: He is walking 3-4 days weekly for one hour.  Eye exam: Due next week Dental exam: Completes four times annually   Colonoscopy: Completed in 2019, due in 2024 PSA: 1.32 in 2020 Hep C Screen: Negative  BP Readings from Last 3 Encounters:  02/13/19 (!) 140/92  01/20/18 130/80  11/16/17 122/68   He will check his BP at home which is running 140/80 on average.   Review of Systems  Constitutional: Negative for unexpected weight change.  HENT: Negative for rhinorrhea.   Respiratory: Negative for cough and shortness of breath.   Cardiovascular: Negative for chest pain.  Gastrointestinal: Negative for constipation and diarrhea.  Genitourinary: Negative for difficulty urinating.  Musculoskeletal: Negative for arthralgias and myalgias.  Skin: Negative for rash.  Allergic/Immunologic: Negative for environmental allergies.  Neurological: Negative for dizziness, numbness and headaches.  Psychiatric/Behavioral: The patient is not nervous/anxious.        Past Medical History:  Diagnosis Date  . Cataract   . Chronic gout   . CKD (chronic kidney disease)    pt. denies  . Erectile dysfunction   . GERD (gastroesophageal reflux disease)   . Herpes simplex   . Hx of adenomatous polyp of colon 04/12/2017  . Hyperlipidemia    pt. denies  . Hypertension    pt. denies  . OSA on CPAP   . Sleep apnea    cpap  . Testosterone deficiency      Social History   Socioeconomic History   . Marital status: Married    Spouse name: Not on file  . Number of children: Not on file  . Years of education: Not on file  . Highest education level: Not on file  Occupational History  . Not on file  Social Needs  . Financial resource strain: Not hard at all  . Food insecurity    Worry: Never true    Inability: Never true  . Transportation needs    Medical: No    Non-medical: No  Tobacco Use  . Smoking status: Never Smoker  . Smokeless tobacco: Never Used  Substance and Sexual Activity  . Alcohol use: Yes    Alcohol/week: 2.0 standard drinks    Types: 2 Glasses of wine per week  . Drug use: No  . Sexual activity: Not on file  Lifestyle  . Physical activity    Days per week: 0 days    Minutes per session: 0 min  . Stress: Not at all  Relationships  . Social Herbalist on phone: Not on file    Gets together: Not on file    Attends religious service: Not on file    Active member of club or organization: Not on file    Attends meetings of clubs or organizations: Not on file    Relationship status: Not on file  . Intimate partner violence    Fear of current or ex partner: No    Emotionally abused: No  Physically abused: No    Forced sexual activity: No  Other Topics Concern  . Not on file  Social History Narrative  . Not on file    Past Surgical History:  Procedure Laterality Date  . COLONOSCOPY    . TONSILLECTOMY AND ADENOIDECTOMY  1960    Family History  Problem Relation Age of Onset  . Rectal cancer Mother   . Colon polyps Neg Hx   . Esophageal cancer Neg Hx   . Colon cancer Neg Hx   . Stomach cancer Neg Hx     No Known Allergies  Current Outpatient Medications on File Prior to Visit  Medication Sig Dispense Refill  . allopurinol (ZYLOPRIM) 300 MG tablet Take 1 tablet (300 mg total) by mouth daily. For gout prevention. 90 tablet 3  . amLODipine (NORVASC) 5 MG tablet Take 5 mg by mouth daily.    Marland Kitchen atorvastatin (LIPITOR) 20 MG tablet  TAKE 1 TABLET BY MOUTH EVERY DAY IN THE EVENING 90 tablet 1  . clobetasol ointment (TEMOVATE) AB-123456789 % Apply 1 application topically as needed (for ecsema).     . DULoxetine (CYMBALTA) 30 MG capsule Take 1 capsule (30 mg total) by mouth daily. For depression. 90 capsule 3  . levothyroxine (SYNTHROID) 50 MCG tablet TAKE 1 TABLET BY MOUTH IN THE MORNING ON AN EMPTY STOMACH WITH WATER ONLY. (DO NOT EAT OR TAKE ANY OTHER MEDICATION 30MINS BEFORE) 90 tablet 1  . metoprolol succinate (TOPROL-XL) 25 MG 24 hr tablet Take 25 mg by mouth daily.    . sildenafil (VIAGRA) 100 MG tablet Take 1/2-1 tablet by mouth 30 minutes prior to intercourse. 6 tablet 3  . tamsulosin (FLOMAX) 0.4 MG CAPS capsule Take 0.4 mg by mouth at bedtime.    Marland Kitchen testosterone cypionate (DEPOTESTOSTERONE CYPIONATE) 200 MG/ML injection SMARTSIG:0.5 Milliliter(s) IM Once a Week    . traZODone (DESYREL) 50 MG tablet TAKE 1 TABLET BY MOUTH EVERY DAY AT BEDTIME AS NEEDED 90 tablet 1   No current facility-administered medications on file prior to visit.     BP (!) 140/92   Pulse 94   Temp 97.7 F (36.5 C) (Temporal)   Ht 5\' 8"  (1.727 m)   Wt 240 lb 4 oz (109 kg)   SpO2 98%   BMI 36.53 kg/m    Objective:   Physical Exam  Constitutional: He is oriented to person, place, and time. He appears well-nourished.  HENT:  Right Ear: Tympanic membrane and ear canal normal.  Left Ear: Tympanic membrane and ear canal normal.  Mouth/Throat: Oropharynx is clear and moist.  Eyes: Pupils are equal, round, and reactive to light. EOM are normal.  Neck: Neck supple.  Cardiovascular: Normal rate and regular rhythm.  Respiratory: Effort normal and breath sounds normal.  GI: Soft. Bowel sounds are normal. There is no abdominal tenderness.  Musculoskeletal: Normal range of motion.  Neurological: He is alert and oriented to person, place, and time.  Skin: Skin is warm and dry.  Psychiatric: He has a normal mood and affect.           Assessment &  Plan:

## 2019-02-13 NOTE — Patient Instructions (Signed)
Continue to work on a healthy diet.  Continue exercising. You should be getting 150 minutes of moderate intensity exercise weekly.  Please notify me if you are taking metoprolol tartrate or metoprolol succinate for blood pressure.  Start monitoring your blood pressure daily, around the same time of day, for the next 2-3 weeks.  Ensure that you have rested for 30 minutes prior to checking your blood pressure. Record your readings and call me with some of those readings.  It was a pleasure to see you today!   Preventive Care 69 Years and Older, Male Preventive care refers to lifestyle choices and visits with your health care provider that can promote health and wellness. This includes:  A yearly physical exam. This is also called an annual well check.  Regular dental and eye exams.  Immunizations.  Screening for certain conditions.  Healthy lifestyle choices, such as diet and exercise. What can I expect for my preventive care visit? Physical exam Your health care provider will check:  Height and weight. These may be used to calculate body mass index (BMI), which is a measurement that tells if you are at a healthy weight.  Heart rate and blood pressure.  Your skin for abnormal spots. Counseling Your health care provider may ask you questions about:  Alcohol, tobacco, and drug use.  Emotional well-being.  Home and relationship well-being.  Sexual activity.  Eating habits.  History of falls.  Memory and ability to understand (cognition).  Work and work Statistician. What immunizations do I need?  Influenza (flu) vaccine  This is recommended every year. Tetanus, diphtheria, and pertussis (Tdap) vaccine  You may need a Td booster every 10 years. Varicella (chickenpox) vaccine  You may need this vaccine if you have not already been vaccinated. Zoster (shingles) vaccine  You may need this after age 69. Pneumococcal conjugate (PCV13) vaccine  One dose is  recommended after age 69. Pneumococcal polysaccharide (PPSV23) vaccine  One dose is recommended after age 69. Measles, mumps, and rubella (MMR) vaccine  You may need at least one dose of MMR if you were born in 1957 or later. You may also need a second dose. Meningococcal conjugate (MenACWY) vaccine  You may need this if you have certain conditions. Hepatitis A vaccine  You may need this if you have certain conditions or if you travel or work in places where you may be exposed to hepatitis A. Hepatitis B vaccine  You may need this if you have certain conditions or if you travel or work in places where you may be exposed to hepatitis B. Haemophilus influenzae type b (Hib) vaccine  You may need this if you have certain conditions. You may receive vaccines as individual doses or as more than one vaccine together in one shot (combination vaccines). Talk with your health care provider about the risks and benefits of combination vaccines. What tests do I need? Blood tests  Lipid and cholesterol levels. These may be checked every 5 years, or more frequently depending on your overall health.  Hepatitis C test.  Hepatitis B test. Screening  Lung cancer screening. You may have this screening every year starting at age 69 if you have a 30-pack-year history of smoking and currently smoke or have quit within the past 15 years.  Colorectal cancer screening. All adults should have this screening starting at age 69 and continuing until age 60. and continuing until age 60. Your health care provider may recommend screening at age 11 if you are at increased risk. You will have  tests every 1-10 years, depending on your results and the type of screening test.  Prostate cancer screening. Recommendations will vary depending on your family history and other risks.  Diabetes screening. This is done by checking your blood sugar (glucose) after you have not eaten for a while (fasting). You may have this done every 1-3  years.  Abdominal aortic aneurysm (AAA) screening. You may need this if you are a current or former smoker.  Sexually transmitted disease (STD) testing. Follow these instructions at home: Eating and drinking  Eat a diet that includes fresh fruits and vegetables, whole grains, lean protein, and low-fat dairy products. Limit your intake of foods with high amounts of sugar, saturated fats, and salt.  Take vitamin and mineral supplements as recommended by your health care provider.  Do not drink alcohol if your health care provider tells you not to drink.  If you drink alcohol: ? Limit how much you have to 0-2 drinks a day. ? Be aware of how much alcohol is in your drink. In the [place] one drink equals one 12 oz bottle of beer (355 mL), one 5 oz glass of wine (148 mL), or one 1 oz glass of hard liquor (44 mL). Lifestyle  Take daily care of your teeth and gums.  Stay active. Exercise for at least 30 minutes on 5 or more days each week.  Do not use any products that contain nicotine or tobacco, such as cigarettes, e-cigarettes, and chewing tobacco. If you need help quitting, ask your health care provider.  If you are sexually active, practice safe sex. Use a condom or other form of protection to prevent STIs (sexually transmitted infections).  Talk with your health care provider about taking a low-dose aspirin or statin. What's next?  Visit your health care provider once a year for a well check visit.  Ask your health care provider how often you should have your eyes and teeth checked.  Stay up to date on all vaccines. This information is not intended to replace advice given to you by your health care provider. Make sure you discuss any questions you have with your health care provider. Document Released: 04/11/2015 Document Revised: 03/09/2018 Document Reviewed: 03/09/2018 Elsevier Patient Education  2020 [employer].

## 2019-02-13 NOTE — Assessment & Plan Note (Signed)
Following with nephrology, no longer managed on ARB. Nephrologist aware of elevated BP readings.

## 2019-02-13 NOTE — Assessment & Plan Note (Signed)
Colonoscopy UTD, due again in 2024.

## 2019-02-13 NOTE — Assessment & Plan Note (Signed)
Doing well on duloxetine, continue same. 

## 2019-02-13 NOTE — Assessment & Plan Note (Signed)
Taking levothyroxine overall appropriately, he did just start a multivitamin so we discussed to separate at least 4 hours from levothyroxine.  TSH due early next year. Continue current dose of levothyroxine.

## 2019-02-13 NOTE — Assessment & Plan Note (Signed)
Above goal in the office today, home readings also sound too high but he is checking infrequently.  Discussed to monitor home BP over the next 1-2 weeks and report readings to [place]. Consider increasing Amlodipine to 10 mg if needed. Continue metoprolol succinate.

## 2019-02-13 NOTE — Assessment & Plan Note (Signed)
Recent Uric acid well controlled. Continue allopurinol.

## 2019-02-13 NOTE — Assessment & Plan Note (Signed)
Immunizations UTD. PSA UTD. Colonoscopy UTD. Discussed the importance of a healthy diet and regular exercise in order for weight loss, and to reduce the risk of any potential medical problems. Exam today stable. Labs reviewed.

## 2019-02-13 NOTE — Assessment & Plan Note (Signed)
Following with Urology and undergoing testosterone injections. CBC overall unremarkable. BP above goal which could be secondary to testosterone. We will monitor BP.

## 2019-02-13 NOTE — Assessment & Plan Note (Signed)
LDL slightly elevated, he would like to work on lifestyle changes. Continue to monitor. Continue atorvastatin.

## 2019-02-13 NOTE — Assessment & Plan Note (Signed)
Following with Urology, continue sildenafil PRN.

## 2019-02-21 DIAGNOSIS — H353131 Nonexudative age-related macular degeneration, bilateral, early dry stage: Secondary | ICD-10-CM | POA: Diagnosis not present

## 2019-03-08 ENCOUNTER — Telehealth: Payer: Self-pay | Admitting: Primary Care

## 2019-03-08 NOTE — Telephone Encounter (Signed)
Did you received anything? I do not have it.

## 2019-03-08 NOTE — Telephone Encounter (Signed)
Message left for patient to return my call.  

## 2019-03-08 NOTE — Telephone Encounter (Signed)
I just received this today. I don't know his CPAP pressure settings as I've not managed this before. Ask patient his pap pressure setting number.,  Also check on BP. How's it running?

## 2019-03-08 NOTE — Telephone Encounter (Signed)
Devin Howell,[facility],called.  He faxed a request for Cpap supplies on 03/01/19.  He hasn't received it back.  He said he'd fax the request again.  Beverely Low can't be reached directly, but the phone number will go to the document intake team.

## 2019-03-09 DIAGNOSIS — G4733 Obstructive sleep apnea (adult) (pediatric): Secondary | ICD-10-CM | POA: Diagnosis not present

## 2019-03-09 NOTE — Telephone Encounter (Signed)
Faxed as instructed

## 2019-03-09 NOTE — Telephone Encounter (Signed)
Form completed and placed in [name]'s inbox.

## 2019-03-09 NOTE — Telephone Encounter (Signed)
Spoken and notified patient of Devin Howell comments. Patient stated that Cpap pressure setting is 12.  Patient stated that BP was 137/77 today and usually runs about that number.

## 2019-03-13 DIAGNOSIS — E291 Testicular hypofunction: Secondary | ICD-10-CM | POA: Diagnosis not present

## 2019-03-14 DIAGNOSIS — Z961 Presence of intraocular lens: Secondary | ICD-10-CM | POA: Diagnosis not present

## 2019-03-14 DIAGNOSIS — H26492 Other secondary cataract, left eye: Secondary | ICD-10-CM | POA: Diagnosis not present

## 2019-03-15 ENCOUNTER — Other Ambulatory Visit: Payer: Self-pay | Admitting: Primary Care

## 2019-03-15 DIAGNOSIS — E782 Mixed hyperlipidemia: Secondary | ICD-10-CM

## 2019-03-19 DIAGNOSIS — N401 Enlarged prostate with lower urinary tract symptoms: Secondary | ICD-10-CM | POA: Diagnosis not present

## 2019-03-19 DIAGNOSIS — N5201 Erectile dysfunction due to arterial insufficiency: Secondary | ICD-10-CM | POA: Diagnosis not present

## 2019-03-19 DIAGNOSIS — E291 Testicular hypofunction: Secondary | ICD-10-CM | POA: Diagnosis not present

## 2019-03-19 DIAGNOSIS — R351 Nocturia: Secondary | ICD-10-CM | POA: Diagnosis not present

## 2019-04-04 DIAGNOSIS — G4733 Obstructive sleep apnea (adult) (pediatric): Secondary | ICD-10-CM | POA: Diagnosis not present

## 2019-05-31 ENCOUNTER — Other Ambulatory Visit: Payer: Self-pay | Admitting: Primary Care

## 2019-05-31 DIAGNOSIS — F32A Depression, unspecified: Secondary | ICD-10-CM

## 2019-05-31 DIAGNOSIS — F419 Anxiety disorder, unspecified: Secondary | ICD-10-CM

## 2019-05-31 DIAGNOSIS — F329 Major depressive disorder, single episode, unspecified: Secondary | ICD-10-CM

## 2019-06-04 DIAGNOSIS — G4733 Obstructive sleep apnea (adult) (pediatric): Secondary | ICD-10-CM | POA: Diagnosis not present

## 2019-06-18 ENCOUNTER — Other Ambulatory Visit: Payer: Self-pay | Admitting: Primary Care

## 2019-06-18 DIAGNOSIS — M1A9XX0 Chronic gout, unspecified, without tophus (tophi): Secondary | ICD-10-CM

## 2019-07-02 ENCOUNTER — Other Ambulatory Visit: Payer: Self-pay | Admitting: Primary Care

## 2019-07-02 DIAGNOSIS — F329 Major depressive disorder, single episode, unspecified: Secondary | ICD-10-CM

## 2019-07-02 DIAGNOSIS — F419 Anxiety disorder, unspecified: Secondary | ICD-10-CM

## 2019-07-02 DIAGNOSIS — F32A Depression, unspecified: Secondary | ICD-10-CM

## 2019-07-05 DIAGNOSIS — G4733 Obstructive sleep apnea (adult) (pediatric): Secondary | ICD-10-CM | POA: Diagnosis not present

## 2019-07-11 DIAGNOSIS — N1832 Chronic kidney disease, stage 3b: Secondary | ICD-10-CM | POA: Diagnosis not present

## 2019-07-19 DIAGNOSIS — R3911 Hesitancy of micturition: Secondary | ICD-10-CM | POA: Diagnosis not present

## 2019-07-19 DIAGNOSIS — I129 Hypertensive chronic kidney disease with stage 1 through stage 4 chronic kidney disease, or unspecified chronic kidney disease: Secondary | ICD-10-CM | POA: Diagnosis not present

## 2019-07-19 DIAGNOSIS — R809 Proteinuria, unspecified: Secondary | ICD-10-CM | POA: Diagnosis not present

## 2019-07-19 DIAGNOSIS — N200 Calculus of kidney: Secondary | ICD-10-CM | POA: Diagnosis not present

## 2019-07-19 DIAGNOSIS — N1832 Chronic kidney disease, stage 3b: Secondary | ICD-10-CM | POA: Diagnosis not present

## 2019-07-25 ENCOUNTER — Other Ambulatory Visit: Payer: Self-pay | Admitting: Primary Care

## 2019-07-25 DIAGNOSIS — E039 Hypothyroidism, unspecified: Secondary | ICD-10-CM

## 2019-07-25 NOTE — Telephone Encounter (Signed)
Last prescribed on 01/31/2019 . Last OV on 02/13/2019 and TSH on  05/04/2018. No future OV

## 2019-07-25 NOTE — Telephone Encounter (Signed)
Needs repeat TSH ASAP, please schedule lab only appointment and notify me when he has scheduled.

## 2019-07-26 NOTE — Telephone Encounter (Signed)
Noted, 30 day supply provided.

## 2019-07-26 NOTE — Telephone Encounter (Signed)
Pt is out of town currently. He scheduled labs for 5/4

## 2019-07-27 ENCOUNTER — Other Ambulatory Visit: Payer: Self-pay | Admitting: Primary Care

## 2019-07-27 DIAGNOSIS — E039 Hypothyroidism, unspecified: Secondary | ICD-10-CM

## 2019-07-31 ENCOUNTER — Other Ambulatory Visit: Payer: Medicare HMO

## 2019-07-31 ENCOUNTER — Other Ambulatory Visit: Payer: Self-pay

## 2019-08-04 DIAGNOSIS — G4733 Obstructive sleep apnea (adult) (pediatric): Secondary | ICD-10-CM | POA: Diagnosis not present

## 2019-08-24 ENCOUNTER — Other Ambulatory Visit: Payer: Self-pay | Admitting: Primary Care

## 2019-08-24 DIAGNOSIS — E039 Hypothyroidism, unspecified: Secondary | ICD-10-CM

## 2019-09-13 DIAGNOSIS — N401 Enlarged prostate with lower urinary tract symptoms: Secondary | ICD-10-CM | POA: Diagnosis not present

## 2019-09-13 DIAGNOSIS — E291 Testicular hypofunction: Secondary | ICD-10-CM | POA: Diagnosis not present

## 2019-09-19 DIAGNOSIS — E291 Testicular hypofunction: Secondary | ICD-10-CM | POA: Diagnosis not present

## 2019-09-19 DIAGNOSIS — N5201 Erectile dysfunction due to arterial insufficiency: Secondary | ICD-10-CM | POA: Diagnosis not present

## 2019-09-19 DIAGNOSIS — R8271 Bacteriuria: Secondary | ICD-10-CM | POA: Diagnosis not present

## 2019-09-19 DIAGNOSIS — R972 Elevated prostate specific antigen [PSA]: Secondary | ICD-10-CM | POA: Diagnosis not present

## 2019-09-21 ENCOUNTER — Other Ambulatory Visit: Payer: Self-pay | Admitting: Primary Care

## 2019-09-21 DIAGNOSIS — E782 Mixed hyperlipidemia: Secondary | ICD-10-CM

## 2019-09-26 DIAGNOSIS — N5201 Erectile dysfunction due to arterial insufficiency: Secondary | ICD-10-CM | POA: Diagnosis not present

## 2019-09-27 ENCOUNTER — Other Ambulatory Visit: Payer: Self-pay | Admitting: Primary Care

## 2019-09-27 DIAGNOSIS — E039 Hypothyroidism, unspecified: Secondary | ICD-10-CM

## 2019-10-08 ENCOUNTER — Other Ambulatory Visit: Payer: Self-pay

## 2019-10-09 ENCOUNTER — Other Ambulatory Visit (INDEPENDENT_AMBULATORY_CARE_PROVIDER_SITE_OTHER): Payer: Medicare HMO

## 2019-10-09 DIAGNOSIS — E039 Hypothyroidism, unspecified: Secondary | ICD-10-CM

## 2019-10-09 LAB — TSH: TSH: 5.94 u[IU]/mL — ABNORMAL HIGH (ref 0.35–4.50)

## 2019-10-25 ENCOUNTER — Other Ambulatory Visit: Payer: Self-pay | Admitting: Primary Care

## 2019-10-25 DIAGNOSIS — E039 Hypothyroidism, unspecified: Secondary | ICD-10-CM

## 2019-10-26 NOTE — Telephone Encounter (Signed)
Message left for patient to return my call.  

## 2019-11-05 DIAGNOSIS — D2272 Melanocytic nevi of left lower limb, including hip: Secondary | ICD-10-CM | POA: Diagnosis not present

## 2019-11-05 DIAGNOSIS — D0462 Carcinoma in situ of skin of left upper limb, including shoulder: Secondary | ICD-10-CM | POA: Diagnosis not present

## 2019-11-05 DIAGNOSIS — L570 Actinic keratosis: Secondary | ICD-10-CM | POA: Diagnosis not present

## 2019-11-05 DIAGNOSIS — L814 Other melanin hyperpigmentation: Secondary | ICD-10-CM | POA: Diagnosis not present

## 2019-11-05 DIAGNOSIS — D225 Melanocytic nevi of trunk: Secondary | ICD-10-CM | POA: Diagnosis not present

## 2019-11-05 DIAGNOSIS — D2239 Melanocytic nevi of other parts of face: Secondary | ICD-10-CM | POA: Diagnosis not present

## 2019-11-05 DIAGNOSIS — Z85828 Personal history of other malignant neoplasm of skin: Secondary | ICD-10-CM | POA: Diagnosis not present

## 2019-11-05 DIAGNOSIS — D2271 Melanocytic nevi of right lower limb, including hip: Secondary | ICD-10-CM | POA: Diagnosis not present

## 2019-11-23 ENCOUNTER — Other Ambulatory Visit: Payer: Self-pay | Admitting: Primary Care

## 2019-11-23 DIAGNOSIS — E039 Hypothyroidism, unspecified: Secondary | ICD-10-CM

## 2019-11-26 ENCOUNTER — Other Ambulatory Visit: Payer: Self-pay | Admitting: Primary Care

## 2019-11-26 DIAGNOSIS — F419 Anxiety disorder, unspecified: Secondary | ICD-10-CM

## 2019-11-26 DIAGNOSIS — F32A Depression, unspecified: Secondary | ICD-10-CM

## 2019-12-12 ENCOUNTER — Telehealth: Payer: Self-pay

## 2019-12-12 NOTE — Telephone Encounter (Signed)
Noted and will evaluate.  

## 2019-12-12 NOTE — Telephone Encounter (Signed)
Message received in Wife's my chart. In regard to patient.   Devin Howell needs help.  I need to find a psychiatrist and I don't know  a good one.  Sorry to bother you with this, but I'm desperate.   Thanks  Called wife she is listed on [place]. Patient does not have any suicidal thoughts. I have made virtual visit with patient tomorrow. Reviewed red words to call 911 or go to ED. Patient and wife have concern due to high increase in alcohol intake. Patient admitted to increase in depression symptoms and is self medicating with drinking.

## 2019-12-13 ENCOUNTER — Encounter: Payer: Self-pay | Admitting: Primary Care

## 2019-12-13 ENCOUNTER — Telehealth (INDEPENDENT_AMBULATORY_CARE_PROVIDER_SITE_OTHER): Payer: Medicare HMO | Admitting: Primary Care

## 2019-12-13 ENCOUNTER — Other Ambulatory Visit: Payer: Self-pay

## 2019-12-13 ENCOUNTER — Other Ambulatory Visit: Payer: Self-pay | Admitting: Primary Care

## 2019-12-13 VITALS — Ht 68.0 in | Wt 220.0 lb

## 2019-12-13 DIAGNOSIS — F329 Major depressive disorder, single episode, unspecified: Secondary | ICD-10-CM | POA: Diagnosis not present

## 2019-12-13 DIAGNOSIS — E782 Mixed hyperlipidemia: Secondary | ICD-10-CM

## 2019-12-13 DIAGNOSIS — F419 Anxiety disorder, unspecified: Secondary | ICD-10-CM

## 2019-12-13 DIAGNOSIS — R69 Illness, unspecified: Secondary | ICD-10-CM | POA: Diagnosis not present

## 2019-12-13 DIAGNOSIS — F32A Depression, unspecified: Secondary | ICD-10-CM

## 2019-12-13 MED ORDER — CITALOPRAM HYDROBROMIDE 20 MG PO TABS: 20.0000 mg | ORAL_TABLET | Freq: Every day | ORAL | 1 refills | Status: DC

## 2019-12-13 NOTE — Patient Instructions (Signed)
Start citalopram 20 mg daily for anxiety and depression.  Stop duloxetine 30 mg for anxiety and depression.  You will be contacted regarding your referral to therapy.  Please let us know if you have not been contacted within two weeks.   Please call the office to set up a follow-up visit with me in 1 month.  It was a pleasure to see you today! Allie Bossier, NP-C

## 2019-12-13 NOTE — Progress Notes (Signed)
Subjective:    Patient ID: Devin Howell, male    DOB: [birth year], 69 y.o.   MRN: [id number]  HPI  Virtual Visit via Video Note  I connected with Devin Howell on 12/13/19 at 11:40 AM EDT by a video enabled telemedicine application and verified that I am speaking with the correct person using two identifiers.  We attempted to connect via video but he was unable to get his audio to work.  We conducted our call via phone which lasted 11 minutes and 7 seconds.  Location: Patient: home Provider: office   I discussed the limitations of evaluation and management by telemedicine and the availability of in person appointments. The patient expressed understanding and agreed to proceed.  History of Present Illness:  Devin Howell is a 69 year old male with a history of testosterone deficiency, MDD, GAD, CKD, hypertension who presents today to discuss depression.  He admits to chronic depression and anxiety over the last three years. Symptoms include feeling sad, has no happiness, decrease in appetite (has lost 20-30 pounds), increased alcohol intake, feeling lethargic, daily worry, feeling anxious.   He is currently managed on duloxetine 30 mg for which he's been taking for years. He feels that this was initially effective years ago, but doesn't feel any effects from duloxetine   GAD 7 score of 13 and PHQ 9 score of 23 today.  He denies SI/HI.   Observations/Objective:  Alert and oriented. No distress. Speaking in complete sentences.   Assessment and Plan:  See problem based charting.  Follow Up Instructions:  Start citalopram 20 mg daily for anxiety and depression.  Stop duloxetine 30 mg for anxiety and depression.  You will be contacted regarding your referral to therapy.  Please let us know if you have not been contacted within two weeks.   Please call the office to set up a follow-up visit with me in 1 month.  It was a pleasure to see you today! Allie Bossier,  NP-C    I discussed the assessment and treatment plan with the patient. The patient was provided an opportunity to ask questions and all were answered. The patient agreed with the plan and demonstrated an understanding of the instructions.   The patient was advised to call back or seek an in-person evaluation if the symptoms worsen or if the condition fails to improve as anticipated.    Pleas Koch, NP    Review of Systems  Respiratory: Negative for shortness of breath.   Cardiovascular: Negative for chest pain.  Neurological: Negative for headaches.  Psychiatric/Behavioral: Positive for sleep disturbance. Negative for suicidal ideas. The patient is nervous/anxious.        See HPI       Past Medical History:  Diagnosis Date  . Cataract   . Chronic gout   . CKD (chronic kidney disease)    pt. denies  . Erectile dysfunction   . GERD (gastroesophageal reflux disease)   . Herpes simplex   . Hx of adenomatous polyp of colon 04/12/2017  . Hyperlipidemia    pt. denies  . Hypertension    pt. denies  . OSA on CPAP   . Sleep apnea    cpap  . Testosterone deficiency      Social History   Socioeconomic History  . Marital status: Married    Spouse name: Not on file  . Number of children: Not on file  . Years of education: Not on file  . Highest education  level: Not on file  Occupational History  . Not on file  Tobacco Use  . Smoking status: Never Smoker  . Smokeless tobacco: Never Used  Substance and Sexual Activity  . Alcohol use: Yes    Alcohol/week: 2.0 standard drinks    Types: 2 Glasses of wine per week  . Drug use: No  . Sexual activity: Not on file  Other Topics Concern  . Not on file  Social History Narrative  . Not on file   Social Determinants of Health   Financial Resource Strain: Low Risk   . Difficulty of Paying Living Expenses: Not hard at all  Food Insecurity: No Food Insecurity  . Worried About Charity fundraiser in the Last Year: Never  true  . Ran Out of Food in the Last Year: Never true  Transportation Needs: No Transportation Needs  . Lack of Transportation (Medical): No  . Lack of Transportation (Non-Medical): No  Physical Activity: Inactive  . Days of Exercise per Week: 0 days  . Minutes of Exercise per Session: 0 min  Stress: No Stress Concern Present  . Feeling of Stress : Not at all  Social Connections:   . Frequency of Communication with Friends and Family: Not on file  . Frequency of Social Gatherings with Friends and Family: Not on file  . Attends Religious Services: Not on file  . Active Member of Clubs or Organizations: Not on file  . Attends Archivist Meetings: Not on file  . Marital Status: Not on file  Intimate Partner Violence: Not At Risk  . Fear of Current or Ex-Partner: No  . Emotionally Abused: No  . Physically Abused: No  . Sexually Abused: No    Past Surgical History:  Procedure Laterality Date  . COLONOSCOPY    . TONSILLECTOMY AND ADENOIDECTOMY  1960    Family History  Problem Relation Age of Onset  . Rectal cancer Mother   . Colon polyps Neg Hx   . Esophageal cancer Neg Hx   . Colon cancer Neg Hx   . Stomach cancer Neg Hx     No Known Allergies  Current Outpatient Medications on File Prior to Visit  Medication Sig Dispense Refill  . allopurinol (ZYLOPRIM) 300 MG tablet TAKE 1 TABLET (300 MG TOTAL) BY MOUTH DAILY. FOR GOUT PREVENTION. 90 tablet 1  . amLODipine (NORVASC) 5 MG tablet Take 5 mg by mouth daily.    Marland Kitchen atorvastatin (LIPITOR) 20 MG tablet TAKE 1 TABLET BY MOUTH EVERY DAY IN THE EVENING 90 tablet 0  . clobetasol ointment (TEMOVATE) 6.38 % Apply 1 application topically as needed (for ecsema).     Marland Kitchen levothyroxine (SYNTHROID) 50 MCG tablet TAKE 1 TABLET 30 MINUTES BEFORE BREAKFAST ON AN EMPTY STOMACH WITH WATER ONLY. 30 tablet 0  . metoprolol succinate (TOPROL-XL) 25 MG 24 hr tablet Take 25 mg by mouth daily.    Marland Kitchen testosterone cypionate (DEPOTESTOSTERONE  CYPIONATE) 200 MG/ML injection SMARTSIG:0.5 Milliliter(s) IM Once a Week     No current facility-administered medications on file prior to visit.    Ht 5\' 8"  (1.727 m)   Wt 220 lb (99.8 kg)   BMI 33.45 kg/m    Objective:   Physical Exam Pulmonary:     Effort: Pulmonary effort is normal.  Neurological:     Mental Status: He is alert and oriented to person, place, and time.  Psychiatric:        Mood and Affect: Mood normal.  Assessment & Plan:

## 2019-12-13 NOTE — Assessment & Plan Note (Signed)
Deteriorated with active symptoms.  Suspect that duloxetine is no longer effective for him. Given that he feels no effect from duloxetine, we will switch treatment.  Prescription for citalopram 20 mg sent to pharmacy.  We discussed that he may have some transitional symptoms when going from one medication to the other.  Referral placed to therapy.  Follow-up in 1 month.

## 2019-12-19 DIAGNOSIS — R972 Elevated prostate specific antigen [PSA]: Secondary | ICD-10-CM | POA: Diagnosis not present

## 2019-12-20 ENCOUNTER — Other Ambulatory Visit: Payer: Self-pay | Admitting: Primary Care

## 2019-12-20 DIAGNOSIS — M1A9XX0 Chronic gout, unspecified, without tophus (tophi): Secondary | ICD-10-CM

## 2019-12-25 DIAGNOSIS — R69 Illness, unspecified: Secondary | ICD-10-CM | POA: Diagnosis not present

## 2019-12-28 ENCOUNTER — Other Ambulatory Visit: Payer: Self-pay | Admitting: Primary Care

## 2019-12-28 DIAGNOSIS — F32A Depression, unspecified: Secondary | ICD-10-CM

## 2019-12-28 DIAGNOSIS — F419 Anxiety disorder, unspecified: Secondary | ICD-10-CM

## 2019-12-30 DIAGNOSIS — F32A Depression, unspecified: Secondary | ICD-10-CM

## 2019-12-30 DIAGNOSIS — F419 Anxiety disorder, unspecified: Secondary | ICD-10-CM

## 2019-12-30 MED ORDER — CITALOPRAM HYDROBROMIDE 20 MG PO TABS: 20.0000 mg | ORAL_TABLET | Freq: Every day | ORAL | 0 refills | Status: DC

## 2020-01-03 ENCOUNTER — Ambulatory Visit (INDEPENDENT_AMBULATORY_CARE_PROVIDER_SITE_OTHER): Payer: Medicare HMO | Admitting: Psychologist

## 2020-01-03 DIAGNOSIS — F411 Generalized anxiety disorder: Secondary | ICD-10-CM | POA: Diagnosis not present

## 2020-01-03 DIAGNOSIS — R69 Illness, unspecified: Secondary | ICD-10-CM | POA: Diagnosis not present

## 2020-01-03 DIAGNOSIS — F320 Major depressive disorder, single episode, mild: Secondary | ICD-10-CM

## 2020-01-11 ENCOUNTER — Ambulatory Visit (INDEPENDENT_AMBULATORY_CARE_PROVIDER_SITE_OTHER): Payer: Medicare HMO | Admitting: Psychologist

## 2020-01-11 DIAGNOSIS — F320 Major depressive disorder, single episode, mild: Secondary | ICD-10-CM

## 2020-01-11 DIAGNOSIS — R69 Illness, unspecified: Secondary | ICD-10-CM | POA: Diagnosis not present

## 2020-01-11 DIAGNOSIS — F411 Generalized anxiety disorder: Secondary | ICD-10-CM

## 2020-01-15 DIAGNOSIS — N1832 Chronic kidney disease, stage 3b: Secondary | ICD-10-CM | POA: Diagnosis not present

## 2020-01-22 ENCOUNTER — Ambulatory Visit (INDEPENDENT_AMBULATORY_CARE_PROVIDER_SITE_OTHER): Payer: Medicare HMO | Admitting: Psychologist

## 2020-01-22 DIAGNOSIS — F320 Major depressive disorder, single episode, mild: Secondary | ICD-10-CM

## 2020-01-22 DIAGNOSIS — R69 Illness, unspecified: Secondary | ICD-10-CM | POA: Diagnosis not present

## 2020-01-22 DIAGNOSIS — F411 Generalized anxiety disorder: Secondary | ICD-10-CM | POA: Diagnosis not present

## 2020-01-23 ENCOUNTER — Ambulatory Visit (INDEPENDENT_AMBULATORY_CARE_PROVIDER_SITE_OTHER): Payer: Medicare HMO | Admitting: Family Medicine

## 2020-01-23 ENCOUNTER — Encounter: Payer: Self-pay | Admitting: Family Medicine

## 2020-01-23 ENCOUNTER — Other Ambulatory Visit: Payer: Self-pay

## 2020-01-23 VITALS — BP 120/62 | HR 72 | Temp 98.4°F | Ht 68.0 in | Wt 225.2 lb

## 2020-01-23 DIAGNOSIS — M7062 Trochanteric bursitis, left hip: Secondary | ICD-10-CM

## 2020-01-23 DIAGNOSIS — M67952 Unspecified disorder of synovium and tendon, left thigh: Secondary | ICD-10-CM

## 2020-01-23 MED ORDER — TRIAMCINOLONE ACETONIDE 40 MG/ML IJ SUSP
40.0000 mg | Freq: Once | INTRAMUSCULAR | Status: AC
  Administered 2020-01-23: 40 mg via INTRA_ARTICULAR

## 2020-01-23 NOTE — Progress Notes (Signed)
Mercia Dowe T. Safiyah Cisney, MD, [place]  [facility] and [place] at [facility] [place], [address]  Phone: [phone]  FAX: [phone]  JAZPER NIKOLAI - 69 y.o. male  MRN [id number]  Date of Birth: 07/08/1950  Date: 01/23/2020  PCP: Pleas Koch, NP  Referral: Pleas Koch, NP  Chief Complaint  Patient presents with  . Hip Pain    Left    This visit occurred during the SARS-CoV-2 public health emergency.  Safety protocols were in place, including screening questions prior to the visit, additional usage of staff PPE, and extensive cleaning of exam room while observing appropriate contact time as indicated for disinfecting solutions.   Subjective:   Devin Howell is a 69 y.o. very pleasant male patient with Body mass index is 34.25 kg/m. who presents with the following:  He has been having a pain in his left hip multiple months ago, and he is quite active walking about 25 miles a week.  Multiple months ago he did start develop a twinge in his hip, and this is predominantly laterally.  This does hurting when he is going up and down stairs and even walking on a flat ground.  If he lies down he does have some pain laterally, but when he wakes up in the morning it calms down and is really not that bad.  He does not describe groin pain.  He does not have any back pain or radicular symptoms down his leg.  No numbness, tingling.  No weakness.  No history of trauma.  No history of accident.  He has tried some amount of basic over-the-counter things such as Tylenol and NSAIDs.  Did not provide any significant relief.  GTB injection  Review of Systems is noted in the HPI, as appropriate   Objective:   BP 120/62   Pulse 72   Temp 98.4 F (36.9 C) (Temporal)   Ht 5\' 8"  (1.727 m)   Wt 225 lb 4 oz (102.2 kg)   SpO2 97%   BMI 34.25 kg/m   HIP EXAM: SIDE: L ROM: Abduction, Flexion, Internal and  External range of motion: Full Pain with terminal IROM and EROM: None, but he does have some tenderness with abduction testing GTB: Notably tender on the left SLR: NEG Knees: No effusion FABER: NT REVERSE FABER: NT, neg Piriformis: NT at direct palpation Str: flexion: 5/5 abduction: 4+/5 adduction: 5/5 Strength testing non-tender  Radiology: No results found.  Assessment and Plan:     ICD-10-CM   1. Trochanteric bursitis of left hip  M70.62 triamcinolone acetonide (KENALOG-40) injection 40 mg  2. Tendinopathy of left gluteus medius  M67.952    Trochanteric bursitis of the left hip ongoing now for multiple months with some gluteus medius or minimus tendinopathy.  Reviewed basic rehab.  Trochanteric bursa injection with injection adjacent at the gluteus medius insertion inferiorly.  Aspiration/Injection Procedure Note EZEQUIEL MACAULEY 1950/11/16 Date of procedure: 01/23/2020  Procedure: Large Joint Aspiration / Injection of Hip, Trochanteric Bursa, L Indications: Pain  Procedure Details Verbal consent obtained. Risks (including infection, potential atrophy), benefits, and alternatives reviewed. Greater trochanter sterilely prepped with Chloraprep. Ethyl Chloride used for anesthesia. 9 cc of Lidocaine 1% injected with 1 mL of Kenalog 40 mg into trochanteric bursa at area of maximal tenderness at greater trochanter. Needle taken to bone to troch bursa, flows easily. Bursa massaged. No bleeding and no complications. Decreased pain after injection.  Needle: 22 gauge spinal needle Medication: 1 mL of Kenalog 40 mg    Meds ordered this encounter  Medications  . triamcinolone acetonide (KENALOG-40) injection 40 mg   There are no discontinued medications. No orders of the defined types were placed in this encounter.   Follow-up: No follow-ups on file.  Signed,  Maud Deed. Trulee Hamstra, MD   Outpatient Encounter Medications as of 01/23/2020  Medication Sig  . allopurinol  (ZYLOPRIM) 300 MG tablet TAKE 1 TABLET (300 MG TOTAL) BY MOUTH DAILY. FOR GOUT PREVENTION.  Marland Kitchen amLODipine (NORVASC) 5 MG tablet Take 5 mg by mouth daily.  Marland Kitchen atorvastatin (LIPITOR) 20 MG tablet TAKE 1 TABLET BY MOUTH EVERY DAY IN THE EVENING  . citalopram (CELEXA) 20 MG tablet Take 1 tablet (20 mg total) by mouth daily. For anxiety and depression.  . clobetasol ointment (TEMOVATE) 1.76 % Apply 1 application topically as needed (for ecsema).   Marland Kitchen levothyroxine (SYNTHROID) 50 MCG tablet TAKE 1 TABLET 30 MINUTES BEFORE BREAKFAST ON AN EMPTY STOMACH WITH WATER ONLY.  . metoprolol succinate (TOPROL-XL) 25 MG 24 hr tablet Take 25 mg by mouth daily.  Marland Kitchen testosterone cypionate (DEPOTESTOSTERONE CYPIONATE) 200 MG/ML injection SMARTSIG:0.5 Milliliter(s) IM Once a Week  . [EXPIRED] triamcinolone acetonide (KENALOG-40) injection 40 mg    No facility-administered encounter medications on file as of 01/23/2020.

## 2020-01-24 DIAGNOSIS — N200 Calculus of kidney: Secondary | ICD-10-CM | POA: Diagnosis not present

## 2020-01-24 DIAGNOSIS — R3911 Hesitancy of micturition: Secondary | ICD-10-CM | POA: Diagnosis not present

## 2020-01-24 DIAGNOSIS — R809 Proteinuria, unspecified: Secondary | ICD-10-CM | POA: Diagnosis not present

## 2020-01-24 DIAGNOSIS — N1831 Chronic kidney disease, stage 3a: Secondary | ICD-10-CM | POA: Diagnosis not present

## 2020-01-24 DIAGNOSIS — I129 Hypertensive chronic kidney disease with stage 1 through stage 4 chronic kidney disease, or unspecified chronic kidney disease: Secondary | ICD-10-CM | POA: Diagnosis not present

## 2020-02-19 ENCOUNTER — Encounter: Payer: Self-pay | Admitting: Primary Care

## 2020-02-19 ENCOUNTER — Ambulatory Visit (INDEPENDENT_AMBULATORY_CARE_PROVIDER_SITE_OTHER): Payer: Medicare HMO | Admitting: Primary Care

## 2020-02-19 ENCOUNTER — Other Ambulatory Visit: Payer: Self-pay

## 2020-02-19 VITALS — BP 110/64 | HR 78 | Temp 98.6°F | Ht 68.0 in | Wt 233.0 lb

## 2020-02-19 DIAGNOSIS — F3342 Major depressive disorder, recurrent, in full remission: Secondary | ICD-10-CM

## 2020-02-19 DIAGNOSIS — Z0000 Encounter for general adult medical examination without abnormal findings: Secondary | ICD-10-CM

## 2020-02-19 DIAGNOSIS — Z125 Encounter for screening for malignant neoplasm of prostate: Secondary | ICD-10-CM | POA: Diagnosis not present

## 2020-02-19 DIAGNOSIS — R69 Illness, unspecified: Secondary | ICD-10-CM | POA: Diagnosis not present

## 2020-02-19 DIAGNOSIS — E039 Hypothyroidism, unspecified: Secondary | ICD-10-CM

## 2020-02-19 DIAGNOSIS — N189 Chronic kidney disease, unspecified: Secondary | ICD-10-CM | POA: Diagnosis not present

## 2020-02-19 DIAGNOSIS — I10 Essential (primary) hypertension: Secondary | ICD-10-CM | POA: Diagnosis not present

## 2020-02-19 DIAGNOSIS — M1A9XX0 Chronic gout, unspecified, without tophus (tophi): Secondary | ICD-10-CM

## 2020-02-19 DIAGNOSIS — F419 Anxiety disorder, unspecified: Secondary | ICD-10-CM

## 2020-02-19 DIAGNOSIS — F32A Depression, unspecified: Secondary | ICD-10-CM

## 2020-02-19 DIAGNOSIS — N529 Male erectile dysfunction, unspecified: Secondary | ICD-10-CM | POA: Diagnosis not present

## 2020-02-19 DIAGNOSIS — E7800 Pure hypercholesterolemia, unspecified: Secondary | ICD-10-CM | POA: Diagnosis not present

## 2020-02-19 DIAGNOSIS — E349 Endocrine disorder, unspecified: Secondary | ICD-10-CM

## 2020-02-19 LAB — CBC
HCT: 44.1 % (ref 39.0–52.0)
Hemoglobin: 14.8 g/dL (ref 13.0–17.0)
MCHC: 33.6 g/dL (ref 30.0–36.0)
MCV: 104.5 fl — ABNORMAL HIGH (ref 78.0–100.0)
Platelets: 121 10*3/uL — ABNORMAL LOW (ref 150.0–400.0)
RBC: 4.22 Mil/uL (ref 4.22–5.81)
RDW: 16.2 % — ABNORMAL HIGH (ref 11.5–15.5)
WBC: 3.9 10*3/uL — ABNORMAL LOW (ref 4.0–10.5)

## 2020-02-19 LAB — LIPID PANEL
Cholesterol: 281 mg/dL — ABNORMAL HIGH (ref 0–200)
HDL: 162.5 mg/dL (ref >39.00)
LDL Cholesterol: 107 mg/dL — ABNORMAL HIGH (ref 0–99)
NonHDL: 118.18
Total CHOL/HDL Ratio: 2
Triglycerides: 54 mg/dL (ref 0.0–149.0)
VLDL: 10.8 mg/dL (ref 0.0–40.0)

## 2020-02-19 LAB — PSA, MEDICARE: PSA: 1.49 ng/ml (ref 0.10–4.00)

## 2020-02-19 LAB — COMPREHENSIVE METABOLIC PANEL
ALT: 31 U/L (ref 0–53)
AST: 28 U/L (ref 0–37)
Albumin: 4.2 g/dL (ref 3.5–5.2)
Alkaline Phosphatase: 83 U/L (ref 39–117)
BUN: 19 mg/dL (ref 6–23)
CO2: 29 mEq/L (ref 19–32)
Calcium: 8.9 mg/dL (ref 8.4–10.5)
Chloride: 101 mEq/L (ref 96–112)
Creatinine, Ser: 1.54 mg/dL — ABNORMAL HIGH (ref 0.40–1.50)
GFR: 45.31 mL/min — ABNORMAL LOW (ref >60.00)
Glucose, Bld: 87 mg/dL (ref 70–99)
Potassium: 4 mEq/L (ref 3.5–5.1)
Sodium: 141 mEq/L (ref 135–145)
Total Bilirubin: 0.8 mg/dL (ref 0.2–1.2)
Total Protein: 6.8 g/dL (ref 6.0–8.3)

## 2020-02-19 LAB — HEMOGLOBIN A1C: Hgb A1c MFr Bld: 5.1 % (ref 4.6–6.5)

## 2020-02-19 LAB — URIC ACID: Uric Acid, Serum: 3.8 mg/dL — ABNORMAL LOW (ref 4.0–7.8)

## 2020-02-19 LAB — TSH: TSH: 2.64 u[IU]/mL (ref 0.35–4.50)

## 2020-02-19 NOTE — Assessment & Plan Note (Signed)
Following with nephrology semi-annually. Repeat CMP pending.

## 2020-02-19 NOTE — Assessment & Plan Note (Signed)
Compliant to Lipitor 20 mg, continue same. Repeat lipid panel pending.

## 2020-02-19 NOTE — Assessment & Plan Note (Signed)
Follows with Urology, continue testosterone supplementation.

## 2020-02-19 NOTE — Assessment & Plan Note (Signed)
Doing well on citalopram 20 mg, continue same.

## 2020-02-19 NOTE — Assessment & Plan Note (Addendum)
Compliant to allopurinol 300 mg, continue same.  No recent gout flares.

## 2020-02-19 NOTE — Assessment & Plan Note (Signed)
Continues to do well on citalopram 20 mg, continue same. No side effects.

## 2020-02-19 NOTE — Assessment & Plan Note (Signed)
Following with Urology. Continue testosterone supplementation.

## 2020-02-19 NOTE — Assessment & Plan Note (Signed)
He is taking levothyroxine 50 mcg correctly. Repeat TSH pending.

## 2020-02-19 NOTE — Assessment & Plan Note (Signed)
Well controlled in the office today, continue amlodipine 10 mg and metoprolol succinate 25 mg.  Labs pending.

## 2020-02-19 NOTE — Patient Instructions (Signed)
Stop by the lab prior to leaving today. I will notify you of your results once received.   Continue to exercise regularly.  Continue to work on a healthy diet. Ensure you are consuming 64 ounces of water daily.  It was a pleasure to see you today!   Preventive Care 15 Years and Older, Male Preventive care refers to lifestyle choices and visits with your health care provider that can promote health and wellness. This includes:  A yearly physical exam. This is also called an annual well check.  Regular dental and eye exams.  Immunizations.  Screening for certain conditions.  Healthy lifestyle choices, such as diet and exercise. What can I expect for my preventive care visit? Physical exam Your health care provider will check:  Height and weight. These may be used to calculate body mass index (BMI), which is a measurement that tells if you are at a healthy weight.  Heart rate and blood pressure.  Your skin for abnormal spots. Counseling Your health care provider may ask you questions about:  Alcohol, tobacco, and drug use.  Emotional well-being.  Home and relationship well-being.  Sexual activity.  Eating habits.  History of falls.  Memory and ability to understand (cognition).  Work and work Statistician. What immunizations do I need?  Influenza (flu) vaccine  This is recommended every year. Tetanus, diphtheria, and pertussis (Tdap) vaccine  You may need a Td booster every 10 years. Varicella (chickenpox) vaccine  You may need this vaccine if you have not already been vaccinated. Zoster (shingles) vaccine  You may need this after age 33. Pneumococcal conjugate (PCV13) vaccine  One dose is recommended after age 68. Pneumococcal polysaccharide (PPSV23) vaccine  One dose is recommended after age 44. Measles, mumps, and rubella (MMR) vaccine  You may need at least one dose of MMR if you were born in 1957 or later. You may also need a second  dose. Meningococcal conjugate (MenACWY) vaccine  You may need this if you have certain conditions. Hepatitis A vaccine  You may need this if you have certain conditions or if you travel or work in places where you may be exposed to hepatitis A. Hepatitis B vaccine  You may need this if you have certain conditions or if you travel or work in places where you may be exposed to hepatitis B. Haemophilus influenzae type b (Hib) vaccine  You may need this if you have certain conditions. You may receive vaccines as individual doses or as more than one vaccine together in one shot (combination vaccines). Talk with your health care provider about the risks and benefits of combination vaccines. What tests do I need? Blood tests  Lipid and cholesterol levels. These may be checked every 5 years, or more frequently depending on your overall health.  Hepatitis C test.  Hepatitis B test. Screening  Lung cancer screening. You may have this screening every year starting at age 84 if you have a 30-pack-year history of smoking and currently smoke or have quit within the past 15 years.  Colorectal cancer screening. All adults should have this screening starting at age 7 and continuing until age 32. Your health care provider may recommend screening at age 26 if you are at increased risk. You will have tests every 1-10 years, depending on your results and the type of screening test.  Prostate cancer screening. Recommendations will vary depending on your family history and other risks.  Diabetes screening. This is done by checking your blood sugar (glucose)  after you have not eaten for a while (fasting). You may have this done every 1-3 years.  Abdominal aortic aneurysm (AAA) screening. You may need this if you are a current or former smoker.  Sexually transmitted disease (STD) testing. Follow these instructions at home: Eating and drinking  Eat a diet that includes fresh fruits and vegetables, whole  grains, lean protein, and low-fat dairy products. Limit your intake of foods with high amounts of sugar, saturated fats, and salt.  Take vitamin and mineral supplements as recommended by your health care provider.  Do not drink alcohol if your health care provider tells you not to drink.  If you drink alcohol: ? Limit how much you have to 0-2 drinks a day. ? Be aware of how much alcohol is in your drink. In the [place] one drink equals one 12 oz bottle of beer (355 mL), one 5 oz glass of wine (148 mL), or one 1 oz glass of hard liquor (44 mL). Lifestyle  Take daily care of your teeth and gums.  Stay active. Exercise for at least 30 minutes on 5 or more days each week.  Do not use any products that contain nicotine or tobacco, such as cigarettes, e-cigarettes, and chewing tobacco. If you need help quitting, ask your health care provider.  If you are sexually active, practice safe sex. Use a condom or other form of protection to prevent STIs (sexually transmitted infections).  Talk with your health care provider about taking a low-dose aspirin or statin. What's next?  Visit your health care provider once a year for a well check visit.  Ask your health care provider how often you should have your eyes and teeth checked.  Stay up to date on all vaccines. This information is not intended to replace advice given to you by your health care provider. Make sure you discuss any questions you have with your health care provider. Document Revised: 03/09/2018 Document Reviewed: 03/09/2018 Elsevier Patient Education  2020 [employer].

## 2020-02-19 NOTE — Progress Notes (Signed)
Subjective:    Patient ID: Devin Howell, male    DOB: 29-Jul-1950, 69 y.o.   MRN: [id number]  HPI  This visit occurred during the SARS-CoV-2 public health emergency.  Safety protocols were in place, including screening questions prior to the visit, additional usage of staff PPE, and extensive cleaning of exam room while observing appropriate contact time as indicated for disinfecting solutions.   Mr. Devin Howell is a 69 year old male who presents today for complete physical.  Immunizations: -Tetanus: Completed in 2018 -Influenza: Completed this season  -Shingles: Completed Shingrix -Pneumonia: Completed Prevnar and Pneumovax -Covid-19: Completed series  Diet: He endorses a healthy diet. Exercise: He is walking 5 miles six days weekly.  Eye exam: Due in December  Dental exam: Completes four times annually   Colonoscopy: Completed in 2019, due in 2024 PSA: 1.32 in 2020 Hep C Screen:  Wt Readings from Last 3 Encounters:  02/19/20 233 lb (105.7 kg)  01/23/20 225 lb 4 oz (102.2 kg)  12/13/19 220 lb (99.8 kg)   BP Readings from Last 3 Encounters:  02/19/20 110/64  01/23/20 120/62  02/13/19 (!) 140/92      Review of Systems  Constitutional: Negative for unexpected weight change.  HENT: Negative for rhinorrhea.   Respiratory: Negative for cough and shortness of breath.   Cardiovascular: Negative for chest pain.  Gastrointestinal: Negative for constipation and diarrhea.  Genitourinary: Negative for difficulty urinating.  Musculoskeletal: Positive for arthralgias.  Skin: Negative for rash.  Allergic/Immunologic: Negative for environmental allergies.  Neurological: Negative for dizziness, numbness and headaches.  Psychiatric/Behavioral:       Doing well on citalopram 20 mg.        Past Medical History:  Diagnosis Date  . Cataract   . Chronic gout   . CKD (chronic kidney disease)    pt. denies  . Erectile dysfunction   . GERD (gastroesophageal reflux disease)     . Herpes simplex   . Hx of adenomatous polyp of colon 04/12/2017  . Hyperlipidemia    pt. denies  . Hypertension    pt. denies  . OSA on CPAP   . Sleep apnea    cpap  . Testosterone deficiency      Social History   Socioeconomic History  . Marital status: Married    Spouse name: Not on file  . Number of children: Not on file  . Years of education: Not on file  . Highest education level: Not on file  Occupational History  . Not on file  Tobacco Use  . Smoking status: Never Smoker  . Smokeless tobacco: Never Used  Substance and Sexual Activity  . Alcohol use: Yes    Alcohol/week: 2.0 standard drinks    Types: 2 Glasses of wine per week  . Drug use: No  . Sexual activity: Not on file  Other Topics Concern  . Not on file  Social History Narrative  . Not on file   Social Determinants of Health   Financial Resource Strain:   . Difficulty of Paying Living Expenses: Not on file  Food Insecurity:   . Worried About Charity fundraiser in the Last Year: Not on file  . Ran Out of Food in the Last Year: Not on file  Transportation Needs:   . Lack of Transportation (Medical): Not on file  . Lack of Transportation (Non-Medical): Not on file  Physical Activity:   . Days of Exercise per Week: Not on file  . Minutes  of Exercise per Session: Not on file  Stress:   . Feeling of Stress : Not on file  Social Connections:   . Frequency of Communication with Friends and Family: Not on file  . Frequency of Social Gatherings with Friends and Family: Not on file  . Attends Religious Services: Not on file  . Active Member of Clubs or Organizations: Not on file  . Attends Archivist Meetings: Not on file  . Marital Status: Not on file  Intimate Partner Violence:   . Fear of Current or Ex-Partner: Not on file  . Emotionally Abused: Not on file  . Physically Abused: Not on file  . Sexually Abused: Not on file    Past Surgical History:  Procedure Laterality Date  .  COLONOSCOPY    . TONSILLECTOMY AND ADENOIDECTOMY  1960    Family History  Problem Relation Age of Onset  . Rectal cancer Mother   . Colon polyps Neg Hx   . Esophageal cancer Neg Hx   . Colon cancer Neg Hx   . Stomach cancer Neg Hx     No Known Allergies  Current Outpatient Medications on File Prior to Visit  Medication Sig Dispense Refill  . allopurinol (ZYLOPRIM) 300 MG tablet TAKE 1 TABLET (300 MG TOTAL) BY MOUTH DAILY. FOR GOUT PREVENTION. 90 tablet 1  . amLODipine (NORVASC) 5 MG tablet Take 5 mg by mouth daily.    Marland Kitchen atorvastatin (LIPITOR) 20 MG tablet TAKE 1 TABLET BY MOUTH EVERY DAY IN THE EVENING 90 tablet 1  . citalopram (CELEXA) 20 MG tablet Take 1 tablet (20 mg total) by mouth daily. For anxiety and depression. 90 tablet 0  . clobetasol ointment (TEMOVATE) 4.43 % Apply 1 application topically as needed (for ecsema).     Marland Kitchen levothyroxine (SYNTHROID) 50 MCG tablet TAKE 1 TABLET 30 MINUTES BEFORE BREAKFAST ON AN EMPTY STOMACH WITH WATER ONLY. 30 tablet 0  . metoprolol succinate (TOPROL-XL) 25 MG 24 hr tablet Take 25 mg by mouth daily.    Marland Kitchen testosterone cypionate (DEPOTESTOSTERONE CYPIONATE) 200 MG/ML injection SMARTSIG:0.5 Milliliter(s) IM Once a Week    . tamsulosin (FLOMAX) 0.4 MG CAPS capsule Take 0.4 mg by mouth at bedtime.     No current facility-administered medications on file prior to visit.    BP 110/64   Pulse 78   Temp 98.6 F (37 C) (Temporal)   Ht 5\' 8"  (1.727 m)   Wt 233 lb (105.7 kg)   SpO2 96%   BMI 35.43 kg/m    Objective:   Physical Exam HENT:     Right Ear: Tympanic membrane and ear canal normal.     Left Ear: Tympanic membrane and ear canal normal.  Eyes:     Pupils: Pupils are equal, round, and reactive to light.  Cardiovascular:     Rate and Rhythm: Normal rate and regular rhythm.  Pulmonary:     Effort: Pulmonary effort is normal.     Breath sounds: Normal breath sounds.  Abdominal:     General: Bowel sounds are normal.      Palpations: Abdomen is soft.     Tenderness: There is no abdominal tenderness.  Musculoskeletal:        General: Normal range of motion.     Cervical back: Neck supple.  Skin:    General: Skin is warm and dry.  Neurological:     Mental Status: He is alert and oriented to person, place, and time.  Cranial Nerves: No cranial nerve deficit.     Deep Tendon Reflexes:     Reflex Scores:      Patellar reflexes are 2+ on the right side and 2+ on the left side. Psychiatric:        Mood and Affect: Mood normal.            Assessment & Plan:

## 2020-02-19 NOTE — Assessment & Plan Note (Signed)
Immunizations UTD. PSA pending. Colonoscopy UTD, due in 2024. Encouraged a healthy diet and regular exercise.  Exam today unremarkable.  Labs pending.

## 2020-02-22 ENCOUNTER — Other Ambulatory Visit: Payer: Self-pay | Admitting: Primary Care

## 2020-02-22 DIAGNOSIS — E039 Hypothyroidism, unspecified: Secondary | ICD-10-CM

## 2020-03-05 DIAGNOSIS — R972 Elevated prostate specific antigen [PSA]: Secondary | ICD-10-CM | POA: Diagnosis not present

## 2020-03-05 DIAGNOSIS — E291 Testicular hypofunction: Secondary | ICD-10-CM | POA: Diagnosis not present

## 2020-03-12 DIAGNOSIS — R972 Elevated prostate specific antigen [PSA]: Secondary | ICD-10-CM | POA: Diagnosis not present

## 2020-03-12 DIAGNOSIS — R351 Nocturia: Secondary | ICD-10-CM | POA: Diagnosis not present

## 2020-03-12 DIAGNOSIS — N5201 Erectile dysfunction due to arterial insufficiency: Secondary | ICD-10-CM | POA: Diagnosis not present

## 2020-03-12 DIAGNOSIS — N401 Enlarged prostate with lower urinary tract symptoms: Secondary | ICD-10-CM | POA: Diagnosis not present

## 2020-03-12 DIAGNOSIS — E291 Testicular hypofunction: Secondary | ICD-10-CM | POA: Diagnosis not present

## 2020-03-15 ENCOUNTER — Other Ambulatory Visit: Payer: Self-pay | Admitting: Primary Care

## 2020-03-15 DIAGNOSIS — E039 Hypothyroidism, unspecified: Secondary | ICD-10-CM

## 2020-03-17 DIAGNOSIS — H43812 Vitreous degeneration, left eye: Secondary | ICD-10-CM | POA: Diagnosis not present

## 2020-04-01 ENCOUNTER — Other Ambulatory Visit: Payer: Self-pay | Admitting: Primary Care

## 2020-04-01 DIAGNOSIS — F32A Depression, unspecified: Secondary | ICD-10-CM

## 2020-04-06 NOTE — Progress Notes (Signed)
Devin Caison T. Karrington Studnicka, Devin Howell, [place]  Primary Care and [place] at [facility] [place], [address]  Phone: [phone]   FAX: [phone]  RONEL RODEHEAVER - 70 y.o. male   MRN [id number]   Date of Birth: Apr 01, 1950  Date: 04/07/2020   PCP: Pleas Koch, NP   Referral: Pleas Koch, NP  Chief Complaint  Patient presents with   Hip Pain    Left-Seen back in 10/21 and given an injection    This visit occurred during the SARS-CoV-2 public health emergency.  Safety protocols were in place, including screening questions prior to the visit, additional usage of staff PPE, and extensive cleaning of exam room while observing appropriate contact time as indicated for disinfecting solutions.   Subjective:   Devin Howell is a 70 y.o. very pleasant male patient with Body mass index is 36.15 kg/m. who presents with the following:  Patient of Mrs. Carlis Abbott who presents with some ongoing hip pain.  I saw him 01/23/2020 for presumed GTB.  He is very active and walks daily. I did do a GTB injection at his last office visit.  Had been doing 5 miles a day Cut it back to 2.5 miles q 6 days  After my last office visit, he did resume walking approximately 2-1/2 miles daily over 6 days walking.  At this point he does have some lateral pain as well as posterior buttocks pain on the left.  He does not describe focal back pain, and he does not have any radicular symptoms.  He is not having any numbness or tingling.  Review of Systems is noted in the HPI, as appropriate   Objective:   BP 130/62    Pulse 80    Temp 99.7 F (37.6 C) (Temporal)    Ht 5\' 8"  (1.727 m)    Wt 237 lb 12 oz (107.8 kg)    SpO2 94%    BMI 36.15 kg/m    HIP EXAM: SIDE: Left ROM: Abduction, Flexion, Internal and External range of motion: Mild pain with terminal external range of motion, but otherwise range of motion is full GTB: Mild to moderate  pain on the left SLR: NEG Knees: No effusion FABER: NT REVERSE FABER: This causes some mild pain Piriformis: NT at direct palpation Str: flexion: 5/5 abduction: 5/5 adduction: 5/5 Strength testing non-tender     Radiology: DG HIP UNILAT WITH PELVIS 2-3 VIEWS LEFT  Result Date: 04/07/2020 CLINICAL DATA:  Persistent left hip pain. EXAM: DG HIP (WITH OR WITHOUT PELVIS) 2-3V LEFT COMPARISON:  None. FINDINGS: Hip joint space is uniform bilaterally.  No degenerative changes. IMPRESSION: No findings to explain the patient's pain. Electronically Signed   By: Lorin Picket M.D.   On: 04/07/2020 15:56     Assessment and Plan:     ICD-10-CM   1. Trochanteric bursitis of left hip  M70.62   2. Chronic hip pain, left  M25.552 DG HIP UNILAT WITH PELVIS 2-3 VIEWS LEFT   G89.29    My suspicion is that his hip pain and lateral hip are flared up after resuming relatively intense exercise for 70 year old after his prior trochanteric bursa injection.  I am going to have him back off for 1 week and then progress more slowly.  Most likely, he will improve.  If symptoms do continue with some lateral pain, then I think doing an additional injection would be a reasonable thing to do.  His strength is overall quite good, so I do not think any additional rehab would be all that helpful other than the basic rehab he is doing now.  Orders Placed This Encounter  Procedures   DG HIP UNILAT WITH PELVIS 2-3 VIEWS LEFT    Follow-up: No follow-ups on file.  Signed,  Maud Deed. Laneya Gasaway, Devin Howell   Outpatient Encounter Medications as of 04/07/2020  Medication Sig   allopurinol (ZYLOPRIM) 300 MG tablet TAKE 1 TABLET (300 MG TOTAL) BY MOUTH DAILY. FOR GOUT PREVENTION.   amLODipine (NORVASC) 5 MG tablet Take 5 mg by mouth daily.   atorvastatin (LIPITOR) 20 MG tablet TAKE 1 TABLET BY MOUTH EVERY DAY IN THE EVENING   citalopram (CELEXA) 20 MG tablet TAKE 1 TABLET (20 MG TOTAL) BY MOUTH DAILY. FOR ANXIETY AND  DEPRESSION.   clobetasol ointment (TEMOVATE) 6.83 % Apply 1 application topically as needed (for ecsema).    levothyroxine (SYNTHROID) 50 MCG tablet TAKE 1 TABLET 30 MINUTES BEFORE BREAKFAST ON AN EMPTY STOMACH WITH WATER ONLY.   metoprolol succinate (TOPROL-XL) 25 MG 24 hr tablet Take 25 mg by mouth daily.   tamsulosin (FLOMAX) 0.4 MG CAPS capsule Take 0.4 mg by mouth at bedtime.   testosterone cypionate (DEPOTESTOSTERONE CYPIONATE) 200 MG/ML injection SMARTSIG:0.5 Milliliter(s) IM Once a Week   No facility-administered encounter medications on file as of 04/07/2020.

## 2020-04-07 ENCOUNTER — Ambulatory Visit
Admission: RE | Admit: 2020-04-07 | Discharge: 2020-04-07 | Disposition: A | Discharge status: Home / Self Care | Payer: Medicare HMO | Source: Ambulatory Visit | Attending: Family Medicine | Admitting: Family Medicine

## 2020-04-07 ENCOUNTER — Encounter: Payer: Self-pay | Admitting: Family Medicine

## 2020-04-07 ENCOUNTER — Other Ambulatory Visit: Payer: Self-pay

## 2020-04-07 ENCOUNTER — Ambulatory Visit (INDEPENDENT_AMBULATORY_CARE_PROVIDER_SITE_OTHER)
Admission: RE | Admit: 2020-04-07 | Discharge: 2020-04-07 | Disposition: A | Discharge status: Home / Self Care | Payer: Medicare HMO | Source: Ambulatory Visit | Attending: Family Medicine | Admitting: Family Medicine

## 2020-04-07 ENCOUNTER — Ambulatory Visit (INDEPENDENT_AMBULATORY_CARE_PROVIDER_SITE_OTHER): Payer: Medicare HMO | Admitting: Family Medicine

## 2020-04-07 VITALS — BP 130/62 | HR 80 | Temp 99.7°F | Ht 68.0 in | Wt 237.8 lb

## 2020-04-07 DIAGNOSIS — M25552 Pain in left hip: Secondary | ICD-10-CM

## 2020-04-07 DIAGNOSIS — G8929 Other chronic pain: Secondary | ICD-10-CM

## 2020-04-07 DIAGNOSIS — M7062 Trochanteric bursitis, left hip: Secondary | ICD-10-CM

## 2020-04-09 ENCOUNTER — Encounter: Payer: Self-pay | Admitting: Family Medicine

## 2020-04-11 ENCOUNTER — Other Ambulatory Visit: Payer: Self-pay | Admitting: Primary Care

## 2020-04-11 DIAGNOSIS — E039 Hypothyroidism, unspecified: Secondary | ICD-10-CM

## 2020-04-15 ENCOUNTER — Ambulatory Visit (INDEPENDENT_AMBULATORY_CARE_PROVIDER_SITE_OTHER): Payer: Medicare HMO | Admitting: Psychologist

## 2020-04-15 DIAGNOSIS — F320 Major depressive disorder, single episode, mild: Secondary | ICD-10-CM

## 2020-04-15 DIAGNOSIS — F411 Generalized anxiety disorder: Secondary | ICD-10-CM

## 2020-04-15 DIAGNOSIS — R69 Illness, unspecified: Secondary | ICD-10-CM | POA: Diagnosis not present

## 2020-05-01 DIAGNOSIS — G4733 Obstructive sleep apnea (adult) (pediatric): Secondary | ICD-10-CM | POA: Diagnosis not present

## 2020-05-08 ENCOUNTER — Other Ambulatory Visit: Payer: Self-pay | Admitting: Primary Care

## 2020-05-08 DIAGNOSIS — E039 Hypothyroidism, unspecified: Secondary | ICD-10-CM

## 2020-05-13 DIAGNOSIS — M170 Bilateral primary osteoarthritis of knee: Secondary | ICD-10-CM | POA: Diagnosis not present

## 2020-05-19 DIAGNOSIS — G4733 Obstructive sleep apnea (adult) (pediatric): Secondary | ICD-10-CM | POA: Diagnosis not present

## 2020-05-21 DIAGNOSIS — D0462 Carcinoma in situ of skin of left upper limb, including shoulder: Secondary | ICD-10-CM | POA: Diagnosis not present

## 2020-05-21 DIAGNOSIS — L821 Other seborrheic keratosis: Secondary | ICD-10-CM | POA: Diagnosis not present

## 2020-05-21 DIAGNOSIS — L080 Pyoderma: Secondary | ICD-10-CM | POA: Diagnosis not present

## 2020-05-21 DIAGNOSIS — L570 Actinic keratosis: Secondary | ICD-10-CM | POA: Diagnosis not present

## 2020-05-21 DIAGNOSIS — D485 Neoplasm of uncertain behavior of skin: Secondary | ICD-10-CM | POA: Diagnosis not present

## 2020-05-21 DIAGNOSIS — R208 Other disturbances of skin sensation: Secondary | ICD-10-CM | POA: Diagnosis not present

## 2020-06-10 ENCOUNTER — Other Ambulatory Visit: Payer: Self-pay | Admitting: Primary Care

## 2020-06-10 DIAGNOSIS — E782 Mixed hyperlipidemia: Secondary | ICD-10-CM

## 2020-06-15 ENCOUNTER — Other Ambulatory Visit: Payer: Self-pay | Admitting: Primary Care

## 2020-06-15 DIAGNOSIS — M1A9XX0 Chronic gout, unspecified, without tophus (tophi): Secondary | ICD-10-CM

## 2020-06-16 DIAGNOSIS — G4733 Obstructive sleep apnea (adult) (pediatric): Secondary | ICD-10-CM | POA: Diagnosis not present

## 2020-07-17 DIAGNOSIS — G4733 Obstructive sleep apnea (adult) (pediatric): Secondary | ICD-10-CM | POA: Diagnosis not present

## 2020-08-15 ENCOUNTER — Encounter: Payer: Self-pay | Admitting: Primary Care

## 2020-08-15 ENCOUNTER — Emergency Department (HOSPITAL_COMMUNITY): Payer: Medicare HMO

## 2020-08-15 ENCOUNTER — Inpatient Hospital Stay (HOSPITAL_COMMUNITY): Payer: Medicare HMO

## 2020-08-15 ENCOUNTER — Telehealth (INDEPENDENT_AMBULATORY_CARE_PROVIDER_SITE_OTHER): Payer: Medicare HMO | Admitting: Primary Care

## 2020-08-15 ENCOUNTER — Other Ambulatory Visit (HOSPITAL_COMMUNITY): Payer: Medicare HMO

## 2020-08-15 ENCOUNTER — Inpatient Hospital Stay (HOSPITAL_COMMUNITY)
Admission: EM | Admit: 2020-08-15 | Discharge: 2020-08-19 | DRG: 682 | Disposition: A | Discharge status: Home / Self Care | Payer: Medicare HMO | Attending: Internal Medicine | Admitting: Internal Medicine

## 2020-08-15 ENCOUNTER — Other Ambulatory Visit: Payer: Self-pay

## 2020-08-15 ENCOUNTER — Encounter (HOSPITAL_COMMUNITY): Payer: Self-pay | Admitting: *Deleted

## 2020-08-15 DIAGNOSIS — Z20822 Contact with and (suspected) exposure to covid-19: Secondary | ICD-10-CM | POA: Diagnosis not present

## 2020-08-15 DIAGNOSIS — I129 Hypertensive chronic kidney disease with stage 1 through stage 4 chronic kidney disease, or unspecified chronic kidney disease: Secondary | ICD-10-CM | POA: Diagnosis present

## 2020-08-15 DIAGNOSIS — S51011A Laceration without foreign body of right elbow, initial encounter: Secondary | ICD-10-CM | POA: Diagnosis present

## 2020-08-15 DIAGNOSIS — N401 Enlarged prostate with lower urinary tract symptoms: Secondary | ICD-10-CM | POA: Diagnosis present

## 2020-08-15 DIAGNOSIS — M47816 Spondylosis without myelopathy or radiculopathy, lumbar region: Secondary | ICD-10-CM | POA: Diagnosis not present

## 2020-08-15 DIAGNOSIS — K828 Other specified diseases of gallbladder: Secondary | ICD-10-CM | POA: Diagnosis not present

## 2020-08-15 DIAGNOSIS — E039 Hypothyroidism, unspecified: Secondary | ICD-10-CM | POA: Diagnosis not present

## 2020-08-15 DIAGNOSIS — N179 Acute kidney failure, unspecified: Secondary | ICD-10-CM | POA: Diagnosis not present

## 2020-08-15 DIAGNOSIS — S065X9A Traumatic subdural hemorrhage with loss of consciousness of unspecified duration, initial encounter: Secondary | ICD-10-CM

## 2020-08-15 DIAGNOSIS — R112 Nausea with vomiting, unspecified: Secondary | ICD-10-CM | POA: Insufficient documentation

## 2020-08-15 DIAGNOSIS — S066X0A Traumatic subarachnoid hemorrhage without loss of consciousness, initial encounter: Secondary | ICD-10-CM | POA: Diagnosis present

## 2020-08-15 DIAGNOSIS — D61818 Other pancytopenia: Secondary | ICD-10-CM | POA: Diagnosis present

## 2020-08-15 DIAGNOSIS — S066X1A Traumatic subarachnoid hemorrhage with loss of consciousness of 30 minutes or less, initial encounter: Secondary | ICD-10-CM | POA: Diagnosis not present

## 2020-08-15 DIAGNOSIS — E860 Dehydration: Secondary | ICD-10-CM | POA: Diagnosis present

## 2020-08-15 DIAGNOSIS — E785 Hyperlipidemia, unspecified: Secondary | ICD-10-CM | POA: Diagnosis present

## 2020-08-15 DIAGNOSIS — K529 Noninfective gastroenteritis and colitis, unspecified: Secondary | ICD-10-CM | POA: Diagnosis present

## 2020-08-15 DIAGNOSIS — R197 Diarrhea, unspecified: Secondary | ICD-10-CM | POA: Diagnosis not present

## 2020-08-15 DIAGNOSIS — Z800 Family history of malignant neoplasm of digestive organs: Secondary | ICD-10-CM | POA: Diagnosis not present

## 2020-08-15 DIAGNOSIS — S065X0A Traumatic subdural hemorrhage without loss of consciousness, initial encounter: Secondary | ICD-10-CM | POA: Diagnosis not present

## 2020-08-15 DIAGNOSIS — S066X9A Traumatic subarachnoid hemorrhage with loss of consciousness of unspecified duration, initial encounter: Secondary | ICD-10-CM | POA: Insufficient documentation

## 2020-08-15 DIAGNOSIS — Z79890 Hormone replacement therapy (postmenopausal): Secondary | ICD-10-CM

## 2020-08-15 DIAGNOSIS — S0003XA Contusion of scalp, initial encounter: Secondary | ICD-10-CM | POA: Diagnosis not present

## 2020-08-15 DIAGNOSIS — E876 Hypokalemia: Secondary | ICD-10-CM | POA: Diagnosis present

## 2020-08-15 DIAGNOSIS — N200 Calculus of kidney: Secondary | ICD-10-CM | POA: Diagnosis not present

## 2020-08-15 DIAGNOSIS — N182 Chronic kidney disease, stage 2 (mild): Secondary | ICD-10-CM | POA: Diagnosis present

## 2020-08-15 DIAGNOSIS — R110 Nausea: Secondary | ICD-10-CM | POA: Diagnosis not present

## 2020-08-15 DIAGNOSIS — W1830XA Fall on same level, unspecified, initial encounter: Secondary | ICD-10-CM | POA: Diagnosis not present

## 2020-08-15 DIAGNOSIS — Z79899 Other long term (current) drug therapy: Secondary | ICD-10-CM

## 2020-08-15 DIAGNOSIS — W19XXXA Unspecified fall, initial encounter: Secondary | ICD-10-CM

## 2020-08-15 DIAGNOSIS — K219 Gastro-esophageal reflux disease without esophagitis: Secondary | ICD-10-CM | POA: Diagnosis not present

## 2020-08-15 DIAGNOSIS — R338 Other retention of urine: Secondary | ICD-10-CM | POA: Diagnosis present

## 2020-08-15 DIAGNOSIS — S065XAA Traumatic subdural hemorrhage with loss of consciousness status unknown, initial encounter: Secondary | ICD-10-CM

## 2020-08-15 DIAGNOSIS — R7989 Other specified abnormal findings of blood chemistry: Secondary | ICD-10-CM | POA: Diagnosis not present

## 2020-08-15 DIAGNOSIS — R1111 Vomiting without nausea: Secondary | ICD-10-CM | POA: Diagnosis not present

## 2020-08-15 DIAGNOSIS — Z9181 History of falling: Secondary | ICD-10-CM | POA: Diagnosis not present

## 2020-08-15 DIAGNOSIS — S066XAA Traumatic subarachnoid hemorrhage with loss of consciousness status unknown, initial encounter: Secondary | ICD-10-CM | POA: Insufficient documentation

## 2020-08-15 DIAGNOSIS — D696 Thrombocytopenia, unspecified: Secondary | ICD-10-CM | POA: Diagnosis not present

## 2020-08-15 DIAGNOSIS — H5711 Ocular pain, right eye: Secondary | ICD-10-CM | POA: Diagnosis not present

## 2020-08-15 DIAGNOSIS — S065X1A Traumatic subdural hemorrhage with loss of consciousness of 30 minutes or less, initial encounter: Secondary | ICD-10-CM | POA: Diagnosis not present

## 2020-08-15 DIAGNOSIS — R220 Localized swelling, mass and lump, head: Secondary | ICD-10-CM | POA: Diagnosis not present

## 2020-08-15 DIAGNOSIS — K760 Fatty (change of) liver, not elsewhere classified: Secondary | ICD-10-CM | POA: Diagnosis not present

## 2020-08-15 HISTORY — DX: Acute kidney failure, unspecified: N17.9

## 2020-08-15 LAB — I-STAT CHEM 8, ED
BUN: 39 mg/dL — ABNORMAL HIGH (ref 8–23)
Calcium, Ion: 0.85 mmol/L — CL (ref 1.15–1.40)
Chloride: 93 mmol/L — ABNORMAL LOW (ref 98–111)
Creatinine, Ser: 3.2 mg/dL — ABNORMAL HIGH (ref 0.61–1.24)
Glucose, Bld: 81 mg/dL (ref 70–99)
HCT: 39 % (ref 39.0–52.0)
Hemoglobin: 13.3 g/dL (ref 13.0–17.0)
Potassium: 3.6 mmol/L (ref 3.5–5.1)
Sodium: 132 mmol/L — ABNORMAL LOW (ref 135–145)
TCO2: 25 mmol/L (ref 22–32)

## 2020-08-15 LAB — COMPREHENSIVE METABOLIC PANEL
ALT: 48 U/L — ABNORMAL HIGH (ref 0–44)
AST: 58 U/L — ABNORMAL HIGH (ref 15–41)
Albumin: 3.9 g/dL (ref 3.5–5.0)
Alkaline Phosphatase: 83 U/L (ref 38–126)
Anion gap: 22 — ABNORMAL HIGH (ref 5–15)
BUN: 41 mg/dL — ABNORMAL HIGH (ref 8–23)
CO2: 23 mmol/L (ref 22–32)
Calcium: 9 mg/dL (ref 8.9–10.3)
Chloride: 88 mmol/L — ABNORMAL LOW (ref 98–111)
Creatinine, Ser: 2.95 mg/dL — ABNORMAL HIGH (ref 0.61–1.24)
GFR, Estimated: 22 mL/min — ABNORMAL LOW (ref >60)
Glucose, Bld: 79 mg/dL (ref 70–99)
Potassium: 3.6 mmol/L (ref 3.5–5.1)
Sodium: 133 mmol/L — ABNORMAL LOW (ref 135–145)
Total Bilirubin: 1.7 mg/dL — ABNORMAL HIGH (ref 0.3–1.2)
Total Protein: 6.8 g/dL (ref 6.5–8.1)

## 2020-08-15 LAB — CBC WITH DIFFERENTIAL/PLATELET
Abs Immature Granulocytes: 0.05 10*3/uL (ref 0.00–0.07)
Basophils Absolute: 0.1 10*3/uL (ref 0.0–0.1)
Basophils Relative: 1 %
Eosinophils Absolute: 0 10*3/uL (ref 0.0–0.5)
Eosinophils Relative: 0 %
HCT: 38.8 % — ABNORMAL LOW (ref 39.0–52.0)
Hemoglobin: 13.5 g/dL (ref 13.0–17.0)
Immature Granulocytes: 1 %
Lymphocytes Relative: 19 %
Lymphs Abs: 1.1 10*3/uL (ref 0.7–4.0)
MCH: 35.8 pg — ABNORMAL HIGH (ref 26.0–34.0)
MCHC: 34.8 g/dL (ref 30.0–36.0)
MCV: 102.9 fL — ABNORMAL HIGH (ref 80.0–100.0)
Monocytes Absolute: 0.4 10*3/uL (ref 0.1–1.0)
Monocytes Relative: 7 %
Neutro Abs: 4.1 10*3/uL (ref 1.7–7.7)
Neutrophils Relative %: 72 %
Platelets: 102 10*3/uL — ABNORMAL LOW (ref 150–400)
RBC: 3.77 MIL/uL — ABNORMAL LOW (ref 4.22–5.81)
RDW: 14.2 % (ref 11.5–15.5)
WBC: 5.8 10*3/uL (ref 4.0–10.5)
nRBC: 0.3 % — ABNORMAL HIGH (ref 0.0–0.2)

## 2020-08-15 LAB — CBG MONITORING, ED: Glucose-Capillary: 84 mg/dL (ref 70–99)

## 2020-08-15 LAB — HEPATITIS PANEL, ACUTE
HCV Ab: NONREACTIVE
Hep A IgM: NONREACTIVE
Hep B C IgM: NONREACTIVE
Hepatitis B Surface Ag: NONREACTIVE

## 2020-08-15 LAB — LIPASE, BLOOD: Lipase: 51 U/L (ref 11–51)

## 2020-08-15 LAB — RESP PANEL BY RT-PCR (FLU A&B, COVID) ARPGX2
Influenza A by PCR: NEGATIVE
Influenza B by PCR: NEGATIVE
SARS Coronavirus 2 by RT PCR: NEGATIVE

## 2020-08-15 LAB — TSH: TSH: 5.724 u[IU]/mL — ABNORMAL HIGH (ref 0.350–4.500)

## 2020-08-15 MED ORDER — LEVOTHYROXINE SODIUM 50 MCG PO TABS
50.0000 ug | ORAL_TABLET | Freq: Every day | ORAL | Status: DC
  Administered 2020-08-16 – 2020-08-19 (×4): 50 ug via ORAL
  Filled 2020-08-15 (×4): qty 1

## 2020-08-15 MED ORDER — AMLODIPINE BESYLATE 5 MG PO TABS
5.0000 mg | ORAL_TABLET | Freq: Every day | ORAL | Status: DC
  Administered 2020-08-15 – 2020-08-19 (×5): 5 mg via ORAL
  Filled 2020-08-15 (×5): qty 1

## 2020-08-15 MED ORDER — ONDANSETRON HCL 4 MG/2ML IJ SOLN
4.0000 mg | Freq: Four times a day (QID) | INTRAMUSCULAR | Status: DC | PRN
  Administered 2020-08-15 – 2020-08-16 (×2): 4 mg via INTRAVENOUS
  Filled 2020-08-15 (×2): qty 2

## 2020-08-15 MED ORDER — CLOBETASOL PROPIONATE 0.05 % EX OINT
1.0000 "application " | TOPICAL_OINTMENT | CUTANEOUS | Status: DC | PRN
  Filled 2020-08-15: qty 15

## 2020-08-15 MED ORDER — ALLOPURINOL 300 MG PO TABS
300.0000 mg | ORAL_TABLET | Freq: Every day | ORAL | Status: DC
  Administered 2020-08-15 – 2020-08-19 (×5): 300 mg via ORAL
  Filled 2020-08-15 (×6): qty 1

## 2020-08-15 MED ORDER — SODIUM CHLORIDE 0.9 % IV SOLN: INTRAVENOUS | Status: DC

## 2020-08-15 MED ORDER — RISAQUAD PO CAPS
2.0000 | ORAL_CAPSULE | Freq: Three times a day (TID) | ORAL | Status: DC
  Administered 2020-08-15 – 2020-08-19 (×12): 2 via ORAL
  Filled 2020-08-15 (×13): qty 2

## 2020-08-15 MED ORDER — METOPROLOL SUCCINATE ER 25 MG PO TB24
25.0000 mg | ORAL_TABLET | Freq: Every day | ORAL | Status: DC
  Administered 2020-08-15 – 2020-08-19 (×5): 25 mg via ORAL
  Filled 2020-08-15 (×5): qty 1

## 2020-08-15 MED ORDER — HYDRALAZINE HCL 25 MG PO TABS: 25.0000 mg | ORAL_TABLET | Freq: Four times a day (QID) | ORAL | Status: DC | PRN

## 2020-08-15 MED ORDER — TAMSULOSIN HCL 0.4 MG PO CAPS
0.4000 mg | ORAL_CAPSULE | Freq: Every day | ORAL | Status: DC
  Administered 2020-08-15 – 2020-08-18 (×4): 0.4 mg via ORAL
  Filled 2020-08-15 (×4): qty 1

## 2020-08-15 MED ORDER — SODIUM CHLORIDE 0.9 % IV BOLUS
500.0000 mL | Freq: Once | INTRAVENOUS | Status: AC
  Administered 2020-08-15: 500 mL via INTRAVENOUS

## 2020-08-15 MED ORDER — CITALOPRAM HYDROBROMIDE 20 MG PO TABS
20.0000 mg | ORAL_TABLET | Freq: Every day | ORAL | Status: DC
  Administered 2020-08-15 – 2020-08-19 (×5): 20 mg via ORAL
  Filled 2020-08-15 (×5): qty 1

## 2020-08-15 MED ORDER — SODIUM CHLORIDE 0.9 % IV BOLUS
1000.0000 mL | Freq: Once | INTRAVENOUS | Status: AC
  Administered 2020-08-15: 1000 mL via INTRAVENOUS

## 2020-08-15 NOTE — Consult Note (Signed)
Neurosurgery Consultation  Reason for Consult: Subdural hematoma Referring Physician: Georgeanna Lea  CC: headache, N/V/D  HPI: This is a 70 y.o. man that presents with N/V/D and had a likely syncopal episode, falling and hitting his head with obvious scalp wound, prompting him to come to the ED. He currently denies any vomiting this morning, he does endorse a headache. The pain is sharp in quality, localized to the posterior scalp where he has some dried blood, moderate in intensity, worse with movement but improved since last night. He denies any new focal weakness, numbness, or parasthesias. No recent use of anti-platelet or anti-coagulant medications.   ROS: A 14 point ROS was performed and is negative except as noted in the HPI.   PMHx:  Past Medical History:  Diagnosis Date  . Cataract   . Chronic gout   . CKD (chronic kidney disease)    pt. denies  . Erectile dysfunction   . GERD (gastroesophageal reflux disease)   . Herpes simplex   . Hx of adenomatous polyp of colon 04/12/2017  . Hyperlipidemia    pt. denies  . Hypertension    pt. denies  . OSA on CPAP   . Sleep apnea    cpap  . Testosterone deficiency    FamHx:  Family History  Problem Relation Age of Onset  . Rectal cancer Mother   . Colon polyps Neg Hx   . Esophageal cancer Neg Hx   . Colon cancer Neg Hx   . Stomach cancer Neg Hx    SocHx:  reports that he has never smoked. He has never used smokeless tobacco. He reports current alcohol use of about 2.0 standard drinks of alcohol per week. He reports that he does not use drugs.  Exam: Vital signs in last 24 hours: Temp:  [97.9 F (36.6 C)-98.9 F (37.2 C)] 98.6 F (37 C) (05/21 0357) Pulse Rate:  [60-92] 92 (05/21 0357) Resp:  [16-26] 20 (05/21 0357) BP: (113-149)/(54-69) 149/65 (05/21 0357) SpO2:  [92 %-97 %] 94 % (05/21 0357) Weight:  [104.3 kg-107.5 kg] 104.3 kg (05/20 1350) General: Awake, alert, cooperative, lying in bed in NAD Head: Normocephalic,  +scalp lac in the posterior convexity with dried blood HEENT: Neck supple Pulmonary: breathing room air comfortably, no evidence of increased work of breathing Cardiac: RRR Abdomen: S NT ND Extremities: Warm and well perfused x4 Neuro: AOx3, PERRL, EOMI, FS Strength 5/5 x4, SILTx4, no drift   Assessment and Plan: 70 y.o. man s/p syncopal event 2/2 dehydration from gastroenteritis, striking his head. [place] personally reviewed, which shows some trace tSAH in the left convexity with possible small associated subdural hematoma.   -no acute neurosurgical intervention indicated at this time -repeat CTH today, if stable then okay for DVT chemoprophylaxis on 5/22 if indicated -please call with any concerns or questions  Judith Part, MD 08/16/20 8:38 AM [facility]

## 2020-08-15 NOTE — ED Provider Notes (Signed)
Care assumed from PA [facility] at shift change, please see her note for full details, but in brief Devin Howell is a 70 y.o. male presents to the ED for 1.5 weeks of persistent diarrhea and vomiting.  Has been having 4-5 bowel movements daily and has vomited twice in the past 24 hours, has not been able to keep down any food or fluids.  Also has significantly decreased appetite.  He has not had any blood in the stool.  Last night got up quickly in the middle of the night and fell striking his head on a column in his house.  He reports he has been feeling extremely weak and thinks he may have passed out.  No blood thinners.  No neurologic deficits.  Lab work consistent with AKI.  IV fluids ordered  CT imaging of the head, orbits, abdomen and pelvis pending.  Plan: Will require admission for AKI in the setting of persistent GI losses, awaiting CT imaging   BP (!) 115/55   Pulse 70   Temp 98 F (36.7 C)   Resp 16   Ht 5\' 8"  (1.727 m)   Wt 104.3 kg   SpO2 97%   BMI 34.97 kg/m     ED Course/Procedures   Labs Reviewed  CBC WITH DIFFERENTIAL/PLATELET - Abnormal; Notable for the following components:      Result Value   RBC 3.77 (*)    HCT 38.8 (*)    MCV 102.9 (*)    MCH 35.8 (*)    Platelets 102 (*)    nRBC 0.3 (*)    All other components within normal limits  COMPREHENSIVE METABOLIC PANEL - Abnormal; Notable for the following components:   Sodium 133 (*)    Chloride 88 (*)    BUN 41 (*)    Creatinine, Ser 2.95 (*)    AST 58 (*)    ALT 48 (*)    Total Bilirubin 1.7 (*)    GFR, Estimated 22 (*)    Anion gap 22 (*)    All other components within normal limits  I-STAT CHEM 8, ED - Abnormal; Notable for the following components:   Sodium 132 (*)    Chloride 93 (*)    BUN 39 (*)    Creatinine, Ser 3.20 (*)    Calcium, Ion 0.85 (*)    All other components within normal limits  RESP PANEL BY RT-PCR (FLU A&B, COVID) ARPGX2  GASTROINTESTINAL PANEL BY PCR, STOOL  (REPLACES STOOL CULTURE)  C DIFFICILE QUICK SCREEN W PCR REFLEX  LIPASE, BLOOD  URINALYSIS, ROUTINE W REFLEX MICROSCOPIC  CBG MONITORING, ED    CT ABDOMEN PELVIS WO CONTRAST  Result Date: 08/15/2020 CLINICAL DATA:  Nausea and vomiting for several days EXAM: CT ABDOMEN AND PELVIS WITHOUT CONTRAST TECHNIQUE: Multidetector CT imaging of the abdomen and pelvis was performed following the standard protocol without IV contrast. COMPARISON:  05/30/2014 FINDINGS: Lower chest: Lung bases demonstrate some very mild scarring bilaterally. Hepatobiliary: Fatty infiltration of the liver is noted. The gallbladder is well distended. Pancreas: Unremarkable. No pancreatic ductal dilatation or surrounding inflammatory changes. Spleen: Normal in size without focal abnormality. Adrenals/Urinary Tract: Adrenal glands are within normal limits. Kidneys demonstrate tiny nonobstructing calculi bilaterally. The ureters are well visualized and within normal limits. Scarring of the left kidney is seen related to prior obstructive changes. Bladder is well distended. Stomach/Bowel: No obstructive or inflammatory changes of the colon are seen. The appendix is within normal limits. No small bowel or  gastric abnormality is seen. Vascular/Lymphatic: Aortic atherosclerosis. No enlarged abdominal or pelvic lymph nodes. Reproductive: Prostate is unremarkable. Other: No abdominal wall hernia or abnormality. No abdominopelvic ascites. Musculoskeletal: Degenerative changes of lumbar spine are noted. IMPRESSION: Fatty liver. Tiny nonobstructing renal calculi bilaterally. Left renal scarring stable from the prior study. Aortic Atherosclerosis (ICD10-I70.0). Electronically Signed   By: Inez Catalina M.D.   On: 08/15/2020 15:31   CT Head Wo Contrast  Result Date: 08/15/2020 CLINICAL DATA:  Head trauma. Fall last night with right eye swelling and redness. EXAM: CT HEAD WITHOUT CONTRAST TECHNIQUE: Contiguous axial images were obtained from the base  of the skull through the vertex without intravenous contrast. COMPARISON:  None. FINDINGS: Brain: There is no evidence of an acute infarct, mass, or midline shift. There is trace left parieto-occipital subarachnoid hemorrhage. Trace subdural hemorrhage over the left cerebral convexity measures up to 3 mm in thickness without mass effect. Mild cerebral atrophy is within normal limits for age. Hypodensities in the cerebral white matter bilaterally are nonspecific but compatible with mild chronic small vessel ischemic disease. Vascular: Calcified atherosclerosis at the skull base. No hyperdense vessel. Skull: No fracture or suspicious osseous lesion. Sinuses/Orbits: Orbits more fully evaluated on dedicated orbital CT. Clear paranasal sinuses and mastoid air cells. Other: Small parietal scalp hematoma. IMPRESSION: 1. Trace left parieto-occipital subarachnoid hemorrhage. 2. Trace subdural hemorrhage over the left cerebral convexity without mass effect. 3. Small parietal scalp hematoma. Critical Value/emergent results were called by telephone at the time of interpretation on 08/15/2020 at 3:58 pm to provider Benedetto Goad, who verbally acknowledged these results. Electronically Signed   By: Logan Bores M.D.   On: 08/15/2020 15:59   CT OrbitsS W/O CM  Result Date: 08/15/2020 CLINICAL DATA:  Recent fall with right eye pain and swelling, initial encounter EXAM: CT ORBITS WITHOUT CONTRAST TECHNIQUE: Multidetector CT imaging of the orbits was performed using the standard protocol without intravenous contrast. Multiplanar CT image reconstructions were also generated. COMPARISON:  No pertinent prior exam. FINDINGS: Orbits: Orbits and their contents are within normal limits. No postseptal soft tissue edema is seen. Very mild soft tissue swelling over the right eye is noted consistent with the recent injury. Visible paranasal sinuses: Paranasal sinuses are well aerated without focal abnormality. Soft tissues: Surrounding soft  tissue structures are otherwise within normal limits aside from the periorbital swelling on the right. Osseous: No acute bony abnormality is seen. Limited intracranial: Limited intracranial views show mild atrophic changes. IMPRESSION: Mild periorbital swelling without intraorbital abnormality. No bony abnormality is noted. Electronically Signed   By: Inez Catalina M.D.   On: 08/15/2020 15:23     Procedures  MDM    15:59 received call from Dr. Jeralyn Ruths with radiology regarding CT head, patient has trace subdural hemorrhage over the left cerebral convexity without mass-effect and small area of left parieto-occipital subarachnoid hemorrhage.  Small parietal Scalp hematoma noted.  No skull fracture.  Patient without headache or neurologic deficits at this time.  Not on blood thinners.  Will consult neurosurgery.  I updated patient and wife at bedside regarding CT results.  CT of the orbits as well as the abdomen and pelvis without acute findings.  Ordered GI pathogen panel and C. difficile given persistent diarrhea.  With AKI patient will require medicine admission for continued rehydration and further evaluation of diarrhea.  Discussed CT head findings with Dr. Zada Finders with neurosurgery, he reviewed patient's imaging, recommends close monitoring but no emergent neurosurgical intervention.  He will see  patient in consult.  Case discussed with Dr. Roosevelt Locks, with [facility] hospitalist who will see and admit the patient.       Jacqlyn Larsen, PA-C 08/15/20 1741    Gareth Morgan, MD 08/16/20 [phone]

## 2020-08-15 NOTE — Assessment & Plan Note (Signed)
Acute for two weeks with little PO intake. Also fell last night, has a black eye today. Appears lethargic and was hardly able to communicate during HPI, wife provided most information.   Given presentation and medical history we agreed that he needed to seek treatment in the ED. He needs a CT scan of his head, IV hydration, and lab work.   His wife will take him to [facility]. We will call triage with report.

## 2020-08-15 NOTE — ED Notes (Signed)
Pt's triage notes charted by Kevan Rosebush under [facility].

## 2020-08-15 NOTE — ED Provider Notes (Signed)
Emergency Medicine Provider Triage Evaluation Note  Devin Howell , a 70 y.o. male  was evaluated in triage.  Pt complains of vomiting x 1.5-2 weeks, diarrhea, no blood in stools or emesis. No fevers, no sick contacts. No abdominal pain. Bruise to right eye, fell last night when he got up to go to the bathroom, does not know why he fell.. Not on thinners.  Review of Systems  Positive: N/v/d, right arm injury/wound (skin tear), contusion right eye Negative: Abdominal pain, chest pain, SHOB, visual disturbance   Physical Exam  BP (!) 115/55   Pulse 70   Temp 98 F (36.7 C)   Resp 16   SpO2 97%  Gen:   Awake, no distress, contusion right orbit Resp:  Normal effort  MSK:   Moves extremities without difficulty, skin tear right upper arm Other:    Medical Decision Making  Medically screening exam initiated at 1:44 PM.  Appropriate orders placed.  Devin Howell was informed that the remainder of the evaluation will be completed by another provider, this initial triage assessment does not replace that evaluation, and the importance of remaining in the ED until their evaluation is complete.     Tacy Learn, PA-C 08/15/20 1348    Elnora Morrison, MD 08/15/20 [phone]

## 2020-08-15 NOTE — Progress Notes (Addendum)
NEW ADMISSION NOTE New Admission Note:   Arrival Method: stretcher Mental Orientation: A&O Telemetry: 5M13 Assessment: Completed Skin: intact, abrasion: R lateral arm, L lateral arm, back of head, bruising bilateral arms, (R) black eye IV: RAC Pain: 0/10 Tubes: none Safety Measures: Safety Fall Prevention Plan has been given, discussed and signed Admission: Completed [facility] Orientation: Patient has been orientated to the room, unit and staff.  Family: wife at bedside  Orders have been reviewed and implemented. Will continue to monitor the patient. Call light has been placed within reach and bed alarm has been activated.   Ramirez Fullbright S Lenor Provencher, RN

## 2020-08-15 NOTE — ED Triage Notes (Signed)
Pt came in d/t N/V that has been going on for a week. Pt fell last night caused R eye edema and redness. Pt A& O * 4

## 2020-08-15 NOTE — ED Provider Notes (Addendum)
[facility] Provider Note   CSN: [id number] Arrival date & time: 08/15/20  1105     History Chief Complaint  Patient presents with  . Nausea  . Diarrhea    Devin Howell is a 70 y.o. male with past medical history significant for chronic gout, CKD followed by Dr. Royce Macadamia, hypertension, hypothyroidism.  Patient is not anticoagulated.  HPI Patient presents to emergency room today with chief complaint of nausea, diarrhea and fall.  Patient states he has had nausea and diarrhea for the last 1.5 weeks.  He admits to 2 episodes of nonbloody nonbilious emesis last 24 hours.  He states anytime he tries to eat he will immediately vomit afterwards.  He has significantly decreased appetite and unable to tolerate much PO intake at all.  He is also endorsing frequent diarrhea, reporting 4 episodes today.  He states his stools soft and brown in color.  Denies any blood in stool.  No recent antibiotic use. He has been traveling recently to see family and felt tired however attributed that to being busy. Patient states he got up during the night to use the bathroom and fell.  He thinks he might of felt weak and passed out.  He does not remember exactly what happened. He denies having any chest pain or shortness of breath, just states he felt "very bad."  His wife came to him immediately because she heard the fall.  She was able to help him up and get back in bed. He thinks he hit his head on a pillar in the house.  He had no witnessed seizure-like activity. He noticed swelling to his right eye this morning when he got up. He denies any associated visual changes. He had video visit with pcp today and they recommended ED evaluation.       Past Medical History:  Diagnosis Date  . Cataract   . Chronic gout   . CKD (chronic kidney disease)    pt. denies  . Erectile dysfunction   . GERD (gastroesophageal reflux disease)   . Herpes simplex   . Hx of adenomatous  polyp of colon 04/12/2017  . Hyperlipidemia    pt. denies  . Hypertension    pt. denies  . OSA on CPAP   . Sleep apnea    cpap  . Testosterone deficiency     Patient Active Problem List   Diagnosis Date Noted  . Nausea vomiting and diarrhea 08/15/2020  . Hypothyroidism 02/13/2019  . Dizziness 11/16/2017  . Hx of adenomatous polyp of colon 04/12/2017  . Anxiety and depression 03/01/2017  . Erectile dysfunction 12/13/2016  . Preventative health care 12/13/2016  . Major depressive disorder 12/13/2016  . Hiccups 06/10/2016  . Chronic gout without tophus 04/20/2016  . Genital herpes simplex 04/20/2016  . Testosterone deficiency 04/20/2016  . CKD (chronic kidney disease) 04/20/2016  . Essential hypertension 04/20/2016  . Hyperlipidemia 04/20/2016    Past Surgical History:  Procedure Laterality Date  . COLONOSCOPY    . TONSILLECTOMY AND ADENOIDECTOMY  1960       Family History  Problem Relation Age of Onset  . Rectal cancer Mother   . Colon polyps Neg Hx   . Esophageal cancer Neg Hx   . Colon cancer Neg Hx   . Stomach cancer Neg Hx     Social History   Tobacco Use  . Smoking status: Never Smoker  . Smokeless tobacco: Never Used  Substance Use Topics  . Alcohol  use: Yes    Alcohol/week: 2.0 standard drinks    Types: 2 Glasses of wine per week  . Drug use: No    Home Medications Prior to Admission medications   Medication Sig Start Date End Date Taking? Authorizing Provider  allopurinol (ZYLOPRIM) 300 MG tablet TAKE 1 TABLET (300 MG TOTAL) BY MOUTH DAILY. FOR GOUT PREVENTION. 06/15/20   Pleas Koch, NP  amLODipine (NORVASC) 5 MG tablet Take 5 mg by mouth daily.    [provider]  atorvastatin (LIPITOR) 20 MG tablet Take 1 tablet (20 mg total) by mouth daily. For cholesterol. 06/10/20   Pleas Koch, NP  citalopram (CELEXA) 20 MG tablet TAKE 1 TABLET (20 MG TOTAL) BY MOUTH DAILY. FOR ANXIETY AND DEPRESSION. 04/01/20   Pleas Koch, NP   clobetasol ointment (TEMOVATE) 1.61 % Apply 1 application topically as needed (for ecsema).  04/05/16   [provider]  levothyroxine (SYNTHROID) 50 MCG tablet Take 1 tablet by mouth every morning on an empty stomach with water only.  No food or other medications for 30 minutes. 05/08/20   Pleas Koch, NP  metoprolol succinate (TOPROL-XL) 25 MG 24 hr tablet Take 25 mg by mouth daily.    [provider]  tamsulosin (FLOMAX) 0.4 MG CAPS capsule Take 0.4 mg by mouth at bedtime. 12/24/19   [provider]  testosterone cypionate (DEPOTESTOSTERONE CYPIONATE) 200 MG/ML injection SMARTSIG:0.5 Milliliter(s) IM Once a Week 01/10/19   [provider]    Allergies    Patient has no known allergies.  Review of Systems   Review of Systems All other systems are reviewed and are negative for acute change except as noted in the HPI.  Physical Exam Updated Vital Signs BP (!) 115/55   Pulse 70   Temp 98 F (36.7 C)   Resp 16   Ht 5\' 8"  (1.727 m)   Wt 104.3 kg   SpO2 97%   BMI 34.97 kg/m   Physical Exam Vitals and nursing note reviewed.  Constitutional:      General: He is not in acute distress.    Appearance: He is ill-appearing.  HENT:     Head: Normocephalic.     Jaw: There is normal jaw occlusion.      Comments: Contusion to right orbit and crown.  No facial tenderness.  No palpable skull fracture.      Right Ear: Tympanic membrane and external ear normal. No hemotympanum.     Left Ear: Tympanic membrane and external ear normal. No hemotympanum.     Nose: Nose normal. No signs of injury.     Right Nostril: No septal hematoma.     Left Nostril: No septal hematoma.     Mouth/Throat:     Mouth: Mucous membranes are dry. No injury.     Pharynx: Oropharynx is clear.  Eyes:     General: No scleral icterus.       Right eye: No discharge.        Left eye: No discharge.     Extraocular Movements: Extraocular movements intact.     Conjunctiva/sclera:  Conjunctivae normal.     Pupils: Pupils are equal, round, and reactive to light.     Comments: No pain with EOMs.  Neck:     Vascular: No JVD.     Comments: Full ROM intact without spinous process TTP. No bony stepoffs or deformities, no paraspinous muscle TTP or muscle spasms. No rigidity or meningeal signs. No bruising,  erythema, or swelling.  Cardiovascular:     Rate and Rhythm: Normal rate and regular rhythm.     Pulses: Normal pulses.          Radial pulses are 2+ on the right side and 2+ on the left side.       Dorsalis pedis pulses are 2+ on the right side and 2+ on the left side.     Heart sounds: Normal heart sounds.  Pulmonary:     Comments: Lungs clear to auscultation in all fields. Symmetric chest rise. No wheezing, rales, or rhonchi. Chest:     Chest wall: No tenderness.  Abdominal:     Comments: Abdomen is soft, non-distended, and non-tender in all quadrants. No rigidity, no guarding. No peritoneal signs.  Musculoskeletal:        General: Normal range of motion.     Cervical back: Normal range of motion.     Right lower leg: Edema present.     Left lower leg: Edema present.     Comments: Moving all extremities without pain.   Bilateral lower extremity edema that patient reports is baseline.   Full range of motion of the T-spine and L-spine No tenderness to palpation of the spinous processes of the T-spine or L-spine No crepitus, deformity or step-offs No tenderness to palpation of the paraspinous muscles of the L-spine.  Patient ambulated from wheelchair to stretcher without assistance.    Skin:    General: Skin is warm and dry.     Capillary Refill: Capillary refill takes less than 2 seconds.     Comments: Skin tear on right elbow and left forearm.  Bleeding controlled with pressure.  Neurological:     Mental Status: He is oriented to person, place, and time.     GCS: GCS eye subscore is 4. GCS verbal subscore is 5. GCS motor subscore is 6.     Comments:  Fluent speech, no facial droop.  Psychiatric:        Behavior: Behavior normal.     ED Results / Procedures / Treatments   Labs (all labs ordered are listed, but only abnormal results are displayed) Labs Reviewed  I-STAT CHEM 8, ED - Abnormal; Notable for the following components:      Result Value   Sodium 132 (*)    Chloride 93 (*)    BUN 39 (*)    Creatinine, Ser 3.20 (*)    Calcium, Ion 0.85 (*)    All other components within normal limits  RESP PANEL BY RT-PCR (FLU A&B, COVID) ARPGX2  CBC WITH DIFFERENTIAL/PLATELET  COMPREHENSIVE METABOLIC PANEL  LIPASE, BLOOD  URINALYSIS, ROUTINE W REFLEX MICROSCOPIC  CBG MONITORING, ED    EKG None  Radiology No results found.  Procedures Procedures   Medications Ordered in ED Medications - No data to display  ED Course  I have reviewed the triage vital signs and the nursing notes.  Pertinent labs & imaging results that were available during my care of the patient were reviewed by me and considered in my medical decision making (see chart for details).    MDM Rules/Calculators/A&P                          History provided by patient with additional history obtained from chart review.    Patient presenting with ongoing vomiting and diarrhea with fall last night.  He was seen by provider in triage and lab work as well as  head CT and orbits was ordered.  On my exam patient is ill-appearing although nontoxic.  He is afebrile, hemodynamically stable.  He has swelling in contusion of right orbit.  No pain with EOMs.  No palpable skull fracture.  He looks dehydrated, mucous membranes are dry.  He has no abdominal tenderness.  No peritoneal signs.  He does have skin tear on his right arm that is covered with a bandage.  Wound does not look to be amenable to suture repair.  His tetanus is up-to-date.  Stool cultures ordered.  With concern for possible syncope EKG and cardiac monitoring ordered.  Lab work was also ordered in triage.   CBC without leukocytosis, hemoglobin 13.5, platelets are low at 102 similar to x5 months ago.  CMP shows mild hyponatremia with a sodium of 133.  Chloride is 88, Slight bump in liver enzymes with AST 58 ALT of 40, total bili of 1.7.  Anion gap of 22.  Glucose was noted to be 79.  Patient also has an AKI.  BUN/creatinine today is 41/2.95, compared to prior looks like his baseline appears close to 1.8.  Likely AKI in the setting of severe dehydration.  Ordered a liter of IV fluids.  Patient pending CT head, orbits and abdominal pelvis. Patient care transferred to K. Ford PA-C at the end of my shift. Patient presentation, ED course, and plan of care discussed with review of all pertinent labs and imaging. Please see her note for further details regarding further ED course and disposition.  Anticipate admission for AKI if negative imaging.    Portions of this note were generated with [employer]. Dictation errors may occur despite best attempts at proofreading.   Final Clinical Impression(s) / ED Diagnoses Final diagnoses:  Dehydration  AKI (acute kidney injury) ([facility])  Fall, initial encounter    Rx / DC Orders ED Discharge Orders    None       Lewanda Rife 08/15/20 1614    Barrie Folk, PA-C 08/15/20 1720    Quintella Reichert, MD 08/16/20 1455

## 2020-08-15 NOTE — Progress Notes (Signed)
Patient ID: Devin Howell, male    DOB: 04/01/1950, 70 y.o.   MRN: [id number]  Virtual visit completed through [employer], a video enabled telemedicine application. Due to national recommendations of social distancing due to COVID-19, a virtual visit is felt to be most appropriate for this patient at this time. Reviewed limitations, risks, security and privacy concerns of performing a virtual visit and the availability of in person appointments. I also reviewed that there may be a patient responsible charge related to this service. The patient agreed to proceed.   Patient location: home Provider location: [facility], office Persons participating in this virtual visit: patient, provider, wife  If any vitals were documented, they were collected by patient at home unless specified below.    Pulse 60   Ht 5\' 8"  (1.727 m)   Wt 237 lb (107.5 kg)   BMI 36.04 kg/m    CC: Diarrhea, vomiting Subjective:   HPI: Devin Howell is a 70 y.o. male presenting on 08/15/2020 for several symptoms. He is with his wife who is helping with HPI.  He's been "sick" for two weeks with symptoms of fatigue, lethargy, vomiting, diarrhea, tremors. He's not eaten much at all, is sleeping a lot during the day and night per his wife. His last episode of vomiting was two days ago. He continues with diarrhea and is having 3-4 episodes daily now, is drinking little water.  He fell during the night last night, hit his head, crushed a "pillar" in their living room, now has a black eye to his right side. His wife has been checking on him every 10 min.   Prior to symptom onset he and his wife had been traveling a lot visiting family. He felt tired during their trips but his wife attributed this to the long travel. His wife has not had these symptoms.       Relevant past medical, surgical, family and social history reviewed and updated as indicated. Interim medical history since our last visit  reviewed. Allergies and medications reviewed and updated. Outpatient Medications Prior to Visit  Medication Sig Dispense Refill  . allopurinol (ZYLOPRIM) 300 MG tablet TAKE 1 TABLET (300 MG TOTAL) BY MOUTH DAILY. FOR GOUT PREVENTION. 90 tablet 2  . amLODipine (NORVASC) 5 MG tablet Take 5 mg by mouth daily.    Marland Kitchen atorvastatin (LIPITOR) 20 MG tablet Take 1 tablet (20 mg total) by mouth daily. For cholesterol. 90 tablet 2  . citalopram (CELEXA) 20 MG tablet TAKE 1 TABLET (20 MG TOTAL) BY MOUTH DAILY. FOR ANXIETY AND DEPRESSION. 90 tablet 2  . clobetasol ointment (TEMOVATE) 9.62 % Apply 1 application topically as needed (for ecsema).     Marland Kitchen levothyroxine (SYNTHROID) 50 MCG tablet Take 1 tablet by mouth every morning on an empty stomach with water only.  No food or other medications for 30 minutes. 90 tablet 2  . metoprolol succinate (TOPROL-XL) 25 MG 24 hr tablet Take 25 mg by mouth daily.    . tamsulosin (FLOMAX) 0.4 MG CAPS capsule Take 0.4 mg by mouth at bedtime.    Marland Kitchen testosterone cypionate (DEPOTESTOSTERONE CYPIONATE) 200 MG/ML injection SMARTSIG:0.5 Milliliter(s) IM Once a Week     No facility-administered medications prior to visit.     Per HPI unless specifically indicated in ROS section below Review of Systems  Constitutional: Positive for fatigue. Negative for fever.  Gastrointestinal: Positive for diarrhea, nausea and vomiting.  Neurological: Negative for headaches.   Objective:  Pulse  60   Ht 5\' 8"  (1.727 m)   Wt 237 lb (107.5 kg)   BMI 36.04 kg/m   Wt Readings from Last 3 Encounters:  08/15/20 237 lb (107.5 kg)  04/07/20 237 lb 12 oz (107.8 kg)  02/19/20 233 lb (105.7 kg)       Physical exam: Gen: alert but barely able to participate in HPI, black eye to right eye noted. Sluggish. Appears very tired.  Pulm: no distress, no cough.       Results for orders placed or performed in visit on 02/19/20  TSH  Result Value Ref Range   TSH 2.64 0.35 - 4.50 uIU/mL  PSA,  Medicare  Result Value Ref Range   PSA 1.49 0.10 - 4.00 ng/ml  Lipid panel  Result Value Ref Range   Cholesterol 281 (H) 0 - 200 mg/dL   Triglycerides 54.0 0.0 - 149.0 mg/dL   HDL 162.50 >39.00 mg/dL   VLDL 10.8 0.0 - 40.0 mg/dL   LDL Cholesterol 107 (H) 0 - 99 mg/dL   Total CHOL/HDL Ratio 2    NonHDL 118.18   Comprehensive metabolic panel  Result Value Ref Range   Sodium 141 135 - 145 mEq/L   Potassium 4.0 3.5 - 5.1 mEq/L   Chloride 101 96 - 112 mEq/L   CO2 29 19 - 32 mEq/L   Glucose, Bld 87 70 - 99 mg/dL   BUN 19 6 - 23 mg/dL   Creatinine, Ser 1.54 (H) 0.40 - 1.50 mg/dL   Total Bilirubin 0.8 0.2 - 1.2 mg/dL   Alkaline Phosphatase 83 39 - 117 U/L   AST 28 0 - 37 U/L   ALT 31 0 - 53 U/L   Total Protein 6.8 6.0 - 8.3 g/dL   Albumin 4.2 3.5 - 5.2 g/dL   GFR 45.31 (L) >60.00 mL/min   Calcium 8.9 8.4 - 10.5 mg/dL  CBC  Result Value Ref Range   WBC 3.9 (L) 4.0 - 10.5 K/uL   RBC 4.22 4.22 - 5.81 Mil/uL   Platelets 121.0 (L) 150.0 - 400.0 K/uL   Hemoglobin 14.8 13.0 - 17.0 g/dL   HCT 44.1 39.0 - 52.0 %   MCV 104.5 (H) 78.0 - 100.0 fl   MCHC 33.6 30.0 - 36.0 g/dL   RDW 16.2 (H) 11.5 - 15.5 %  Hemoglobin A1c  Result Value Ref Range   Hgb A1c MFr Bld 5.1 4.6 - 6.5 %  Uric acid  Result Value Ref Range   Uric Acid, Serum 3.8 (L) 4.0 - 7.8 mg/dL   Assessment & Plan:   Problem List Items Addressed This Visit      Digestive   Nausea vomiting and diarrhea    Acute for two weeks with little PO intake. Also fell last night, has a black eye today. Appears lethargic and was hardly able to communicate during HPI, wife provided most information.   Given presentation and medical history we agreed that he needed to seek treatment in the ED. He needs a CT scan of his head, IV hydration, and lab work.   His wife will take him to [facility]. We will call triage with report.           No orders of the defined types were placed in this encounter.  No orders of the defined types were  placed in this encounter.   I discussed the assessment and treatment plan with the patient. The patient was provided an opportunity to ask questions and all were  answered. The patient agreed with the plan and demonstrated an understanding of the instructions. The patient was advised to call back or seek an in-person evaluation if the symptoms worsen or if the condition fails to improve as anticipated.  Follow up plan:  Please go to the hospital now.  Allie Bossier, NP-C   Pleas Koch, NP

## 2020-08-15 NOTE — H&P (Addendum)
History and Physical    Devin Howell TKW:[id number] DOB: [birth year] DOA: 08/15/2020  PCP: Pleas Koch, NP (Confirm with patient/family/NH records and if not entered, this has to be entered at [facility] point of entry) Patient coming from: Home  I have personally briefly reviewed patient's old medical records in [place]  Chief Complaint: N/V and diarrhea  HPI: Devin Howell is a 70 y.o. male with medical history significant of CKD stage 2, HTN, HLD, hypothyroidism, BPH, anxiety/depression, gout, presented with persistent nausea vomiting diarrhea.  Patient traveled to [place] and [place] area for business trip for 2 total of two nights with his wife and came back early this week and then developed nauseous vomit and diarrhea.  Nonbloody nonbilious.  Able to tolerate only small amount of Pasta, and able to drink only small sips of water, no abd pain.  Diarrhea has been watery, 3-5 times a day.  Denies any fever chills and wife does not have any GI symtpoms.  He has been feeling so dehydrated for the last 2 days, this morning he got up to go to the bathroom however, suddenly felt lightheaded and fell down hit right eye and right-sided forehead against wall and fell down. No LOC. ED Course: Blood work found AKI creatinine 2.9 compared to baseline 1.5-2.  CT head showed small left parietal-occipital arachnoid hemorrhage, trace subdural hematoma over the left cerebral convexity without mass-effect.  Review of Systems: As per HPI otherwise 14 point review of systems negative.    Past Medical History:  Diagnosis Date  . Cataract   . Chronic gout   . CKD (chronic kidney disease)    pt. denies  . Erectile dysfunction   . GERD (gastroesophageal reflux disease)   . Herpes simplex   . Hx of adenomatous polyp of colon 04/12/2017  . Hyperlipidemia    pt. denies  . Hypertension    pt. denies  . OSA on CPAP   . Sleep apnea    cpap  . Testosterone deficiency     Past  Surgical History:  Procedure Laterality Date  . COLONOSCOPY    . TONSILLECTOMY AND ADENOIDECTOMY  1960     reports that he has never smoked. He has never used smokeless tobacco. He reports current alcohol use of about 2.0 standard drinks of alcohol per week. He reports that he does not use drugs.  No Known Allergies  Family History  Problem Relation Age of Onset  . Rectal cancer Mother   . Colon polyps Neg Hx   . Esophageal cancer Neg Hx   . Colon cancer Neg Hx   . Stomach cancer Neg Hx      Prior to Admission medications   Medication Sig Start Date End Date Taking? Authorizing Provider  allopurinol (ZYLOPRIM) 300 MG tablet TAKE 1 TABLET (300 MG TOTAL) BY MOUTH DAILY. FOR GOUT PREVENTION. 06/15/20   Pleas Koch, NP  amLODipine (NORVASC) 5 MG tablet Take 5 mg by mouth daily.    [provider]  atorvastatin (LIPITOR) 20 MG tablet Take 1 tablet (20 mg total) by mouth daily. For cholesterol. 06/10/20   Pleas Koch, NP  citalopram (CELEXA) 20 MG tablet TAKE 1 TABLET (20 MG TOTAL) BY MOUTH DAILY. FOR ANXIETY AND DEPRESSION. 04/01/20   Pleas Koch, NP  clobetasol ointment (TEMOVATE) 9.24 % Apply 1 application topically as needed (for ecsema).  04/05/16   [provider]  levothyroxine (SYNTHROID) 50 MCG tablet Take 1 tablet by  mouth every morning on an empty stomach with water only.  No food or other medications for 30 minutes. 05/08/20   Pleas Koch, NP  metoprolol succinate (TOPROL-XL) 25 MG 24 hr tablet Take 25 mg by mouth daily.    [provider]  tamsulosin (FLOMAX) 0.4 MG CAPS capsule Take 0.4 mg by mouth at bedtime. 12/24/19   [provider]  testosterone cypionate (DEPOTESTOSTERONE CYPIONATE) 200 MG/ML injection SMARTSIG:0.5 Milliliter(s) IM Once a Week 01/10/19   [provider]    Physical Exam: Vitals:   08/15/20 1630 08/15/20 1645 08/15/20 1700 08/15/20 1715  BP: 129/69 131/65 113/61 (!) 136/54  Pulse: 79 76  76 88  Resp: 18 20 17 20   Temp:      TempSrc:      SpO2: 93% 94% 93% 95%  Weight:      Height:        Constitutional: NAD, calm, comfortable Vitals:   08/15/20 1630 08/15/20 1645 08/15/20 1700 08/15/20 1715  BP: 129/69 131/65 113/61 (!) 136/54  Pulse: 79 76 76 88  Resp: 18 20 17 20   Temp:      TempSrc:      SpO2: 93% 94% 93% 95%  Weight:      Height:       Eyes: PERRL, lids and conjunctivae normal ENMT: Mucous membranes are dry. Posterior pharynx clear of any exudate or lesions.Normal dentition.  Neck: normal, supple, no masses, no thyromegaly Respiratory: clear to auscultation bilaterally, no wheezing, no crackles. Normal respiratory effort. No accessory muscle use.  Cardiovascular: Regular rate and rhythm, no murmurs / rubs / gallops. No extremity edema. 2+ pedal pulses. No carotid bruits.  Abdomen: no tenderness, no masses palpated. No hepatosplenomegaly. Bowel sounds positive.  Musculoskeletal: no clubbing / cyanosis. No joint deformity upper and lower extremities. Good ROM, no contractures. Normal muscle tone.  Skin: no rashes, lesions, ulcers. No induration Neurologic: CN 2-12 grossly intact. Sensation intact, DTR normal. Strength 5/5 in all 4.  Psychiatric: Normal judgment and insight. Alert and oriented x 3. Normal mood.     Labs on Admission: I have personally reviewed following labs and imaging studies  CBC: Recent Labs  Lab 08/15/20 1349 08/15/20 1411  WBC 5.8  --   NEUTROABS 4.1  --   HGB 13.5 13.3  HCT 38.8* 39.0  MCV 102.9*  --   PLT 102*  --    Basic Metabolic Panel: Recent Labs  Lab 08/15/20 1349 08/15/20 1411  NA 133* 132*  K 3.6 3.6  CL 88* 93*  CO2 23  --   GLUCOSE 79 81  BUN 41* 39*  CREATININE 2.95* 3.20*  CALCIUM 9.0  --    GFR: Estimated Creatinine Clearance: 24.8 mL/min (A) (by C-G formula based on SCr of 3.2 mg/dL (H)). Liver Function Tests: Recent Labs  Lab 08/15/20 1349  AST 58*  ALT 48*  ALKPHOS 83  BILITOT 1.7*  PROT  6.8  ALBUMIN 3.9   Recent Labs  Lab 08/15/20 1349  LIPASE 51   No results for input(s): AMMONIA in the last 168 hours. Coagulation Profile: No results for input(s): INR, PROTIME in the last 168 hours. Cardiac Enzymes: No results for input(s): CKTOTAL, CKMB, CKMBINDEX, TROPONINI in the last 168 hours. BNP (last 3 results) No results for input(s): PROBNP in the last 8760 hours. HbA1C: No results for input(s): HGBA1C in the last 72 hours. CBG: Recent Labs  Lab 08/15/20 1610  GLUCAP 84   Lipid Profile: No results  for input(s): CHOL, HDL, LDLCALC, TRIG, CHOLHDL, LDLDIRECT in the last 72 hours. Thyroid Function Tests: No results for input(s): TSH, T4TOTAL, FREET4, T3FREE, THYROIDAB in the last 72 hours. Anemia Panel: No results for input(s): VITAMINB12, FOLATE, FERRITIN, TIBC, IRON, RETICCTPCT in the last 72 hours. Urine analysis:    Component Value Date/Time   COLORURINE STRAW (A) 05/09/2016 1319   APPEARANCEUR CLEAR 05/09/2016 1319   LABSPEC 1.006 05/09/2016 1319   PHURINE 6.0 05/09/2016 1319   GLUCOSEU NEGATIVE 05/09/2016 1319   HGBUR SMALL (A) 05/09/2016 1319   BILIRUBINUR NEGATIVE 05/09/2016 1319   KETONESUR 5 (A) 05/09/2016 1319   PROTEINUR 30 (A) 05/09/2016 1319   NITRITE NEGATIVE 05/09/2016 1319   LEUKOCYTESUR NEGATIVE 05/09/2016 1319    Radiological Exams on Admission: CT ABDOMEN PELVIS WO CONTRAST  Result Date: 08/15/2020 CLINICAL DATA:  Nausea and vomiting for several days EXAM: CT ABDOMEN AND PELVIS WITHOUT CONTRAST TECHNIQUE: Multidetector CT imaging of the abdomen and pelvis was performed following the standard protocol without IV contrast. COMPARISON:  05/30/2014 FINDINGS: Lower chest: Lung bases demonstrate some very mild scarring bilaterally. Hepatobiliary: Fatty infiltration of the liver is noted. The gallbladder is well distended. Pancreas: Unremarkable. No pancreatic ductal dilatation or surrounding inflammatory changes. Spleen: Normal in size without  focal abnormality. Adrenals/Urinary Tract: Adrenal glands are within normal limits. Kidneys demonstrate tiny nonobstructing calculi bilaterally. The ureters are well visualized and within normal limits. Scarring of the left kidney is seen related to prior obstructive changes. Bladder is well distended. Stomach/Bowel: No obstructive or inflammatory changes of the colon are seen. The appendix is within normal limits. No small bowel or gastric abnormality is seen. Vascular/Lymphatic: Aortic atherosclerosis. No enlarged abdominal or pelvic lymph nodes. Reproductive: Prostate is unremarkable. Other: No abdominal wall hernia or abnormality. No abdominopelvic ascites. Musculoskeletal: Degenerative changes of lumbar spine are noted. IMPRESSION: Fatty liver. Tiny nonobstructing renal calculi bilaterally. Left renal scarring stable from the prior study. Aortic Atherosclerosis (ICD10-I70.0). Electronically Signed   By: Inez Catalina M.D.   On: 08/15/2020 15:31   CT Head Wo Contrast  Result Date: 08/15/2020 CLINICAL DATA:  Head trauma. Fall last night with right eye swelling and redness. EXAM: CT HEAD WITHOUT CONTRAST TECHNIQUE: Contiguous axial images were obtained from the base of the skull through the vertex without intravenous contrast. COMPARISON:  None. FINDINGS: Brain: There is no evidence of an acute infarct, mass, or midline shift. There is trace left parieto-occipital subarachnoid hemorrhage. Trace subdural hemorrhage over the left cerebral convexity measures up to 3 mm in thickness without mass effect. Mild cerebral atrophy is within normal limits for age. Hypodensities in the cerebral white matter bilaterally are nonspecific but compatible with mild chronic small vessel ischemic disease. Vascular: Calcified atherosclerosis at the skull base. No hyperdense vessel. Skull: No fracture or suspicious osseous lesion. Sinuses/Orbits: Orbits more fully evaluated on dedicated orbital CT. Clear paranasal sinuses and  mastoid air cells. Other: Small parietal scalp hematoma. IMPRESSION: 1. Trace left parieto-occipital subarachnoid hemorrhage. 2. Trace subdural hemorrhage over the left cerebral convexity without mass effect. 3. Small parietal scalp hematoma. Critical Value/emergent results were called by telephone at the time of interpretation on 08/15/2020 at 3:58 pm to provider Benedetto Goad, who verbally acknowledged these results. Electronically Signed   By: Logan Bores M.D.   On: 08/15/2020 15:59   CT OrbitsS W/O CM  Result Date: 08/15/2020 CLINICAL DATA:  Recent fall with right eye pain and swelling, initial encounter EXAM: CT ORBITS WITHOUT CONTRAST TECHNIQUE: Multidetector CT imaging  of the orbits was performed using the standard protocol without intravenous contrast. Multiplanar CT image reconstructions were also generated. COMPARISON:  No pertinent prior exam. FINDINGS: Orbits: Orbits and their contents are within normal limits. No postseptal soft tissue edema is seen. Very mild soft tissue swelling over the right eye is noted consistent with the recent injury. Visible paranasal sinuses: Paranasal sinuses are well aerated without focal abnormality. Soft tissues: Surrounding soft tissue structures are otherwise within normal limits aside from the periorbital swelling on the right. Osseous: No acute bony abnormality is seen. Limited intracranial: Limited intracranial views show mild atrophic changes. IMPRESSION: Mild periorbital swelling without intraorbital abnormality. No bony abnormality is noted. Electronically Signed   By: Inez Catalina M.D.   On: 08/15/2020 15:23    EKG: Independently reviewed. Sinus (reported as a-fib), order one repeat in AM.  Assessment/Plan Active Problems:   AKI (acute kidney injury) ([place])  (please populate well all problems here in Problem List. (For example, if patient is on BP meds at home and you resume or decide to hold them, it is a problem that needs to be her. Same for CAD, COPD,  HLD and so on)  Acute gastroenteritis -Suspect viral gastroenteritis, will rule out acute hepatitis -Supportive care with IV fluid, as needed Zofran, start diet.  AKI on CKD stage II -Severely dehydrated, continue IV fluids for today, recheck kidney function tomorrow.  CT abdomen showed nonobstructive kidney stones.  Occipital- parietal subarachnoid hemorrhage -Neuro exam benign -Neurosurgery consulted -He is already taking calcium channel blocker. Will apply stringent blood pressure control aiming at 130/80.  Add as needed hydralazine.  Trace subdural hematoma -Neurologically intact as of now, consider repeat CT head tomorrow if any neurological changes.  Near syncope -From dehydration -IVF and check orthostatic vitals.  Elevated LFT -Given the finding of distended gall bladder, will order RUQ U/S -Also check acute hepatitis panel.  BPH with urinary retention on CAT scan -Continue Flomax -Bladder scan, straight cath if volume more than 300 mL.  Hypothyroidism -Continue Synthroid.  DVT prophylaxis: Code Status: Full Family Communication: Wife at bedside Disposition Plan: Expect more than 2 midnight hospital stay Consults called: Neuro surgery Admission status: Tele admit   Lequita Halt MD [facility] Pager [phone]  08/15/2020, 5:29 PM

## 2020-08-16 ENCOUNTER — Inpatient Hospital Stay (HOSPITAL_COMMUNITY): Payer: Medicare HMO

## 2020-08-16 DIAGNOSIS — S066X1A Traumatic subarachnoid hemorrhage with loss of consciousness of 30 minutes or less, initial encounter: Secondary | ICD-10-CM

## 2020-08-16 DIAGNOSIS — N179 Acute kidney failure, unspecified: Principal | ICD-10-CM

## 2020-08-16 DIAGNOSIS — E860 Dehydration: Secondary | ICD-10-CM

## 2020-08-16 LAB — COMPREHENSIVE METABOLIC PANEL
ALT: 40 U/L (ref 0–44)
AST: 44 U/L — ABNORMAL HIGH (ref 15–41)
Albumin: 3.3 g/dL — ABNORMAL LOW (ref 3.5–5.0)
Alkaline Phosphatase: 69 U/L (ref 38–126)
Anion gap: 20 — ABNORMAL HIGH (ref 5–15)
BUN: 37 mg/dL — ABNORMAL HIGH (ref 8–23)
CO2: 24 mmol/L (ref 22–32)
Calcium: 8.4 mg/dL — ABNORMAL LOW (ref 8.9–10.3)
Chloride: 94 mmol/L — ABNORMAL LOW (ref 98–111)
Creatinine, Ser: 2.73 mg/dL — ABNORMAL HIGH (ref 0.61–1.24)
GFR, Estimated: 24 mL/min — ABNORMAL LOW (ref >60)
Glucose, Bld: 86 mg/dL (ref 70–99)
Potassium: 3.5 mmol/L (ref 3.5–5.1)
Sodium: 138 mmol/L (ref 135–145)
Total Bilirubin: 2.7 mg/dL — ABNORMAL HIGH (ref 0.3–1.2)
Total Protein: 6.1 g/dL — ABNORMAL LOW (ref 6.5–8.1)

## 2020-08-16 LAB — RAPID URINE DRUG SCREEN, HOSP PERFORMED
Amphetamines: NOT DETECTED
Barbiturates: NOT DETECTED
Benzodiazepines: NOT DETECTED
Cocaine: NOT DETECTED
Opiates: NOT DETECTED
Tetrahydrocannabinol: NOT DETECTED

## 2020-08-16 LAB — URINALYSIS, ROUTINE W REFLEX MICROSCOPIC
Bacteria, UA: NONE SEEN
Bilirubin Urine: NEGATIVE
Glucose, UA: NEGATIVE mg/dL
Ketones, ur: 5 mg/dL — AB
Leukocytes,Ua: NEGATIVE
Nitrite: NEGATIVE
Protein, ur: 100 mg/dL — AB
Specific Gravity, Urine: 1.013 (ref 1.005–1.030)
pH: 5 (ref 5.0–8.0)

## 2020-08-16 LAB — CBC
HCT: 35.1 % — ABNORMAL LOW (ref 39.0–52.0)
Hemoglobin: 12.1 g/dL — ABNORMAL LOW (ref 13.0–17.0)
MCH: 35.6 pg — ABNORMAL HIGH (ref 26.0–34.0)
MCHC: 34.5 g/dL (ref 30.0–36.0)
MCV: 103.2 fL — ABNORMAL HIGH (ref 80.0–100.0)
Platelets: 82 10*3/uL — ABNORMAL LOW (ref 150–400)
RBC: 3.4 MIL/uL — ABNORMAL LOW (ref 4.22–5.81)
RDW: 14.5 % (ref 11.5–15.5)
WBC: 4.8 10*3/uL (ref 4.0–10.5)
nRBC: 0 % (ref 0.0–0.2)

## 2020-08-16 MED ORDER — ONDANSETRON HCL 4 MG/2ML IJ SOLN
4.0000 mg | Freq: Once | INTRAMUSCULAR | Status: AC
  Administered 2020-08-16: 4 mg via INTRAVENOUS
  Filled 2020-08-16: qty 2

## 2020-08-16 NOTE — Progress Notes (Signed)
PROGRESS NOTE    Devin Howell  [id number] DOB: 1950-06-27 DOA: 08/15/2020 PCP: Pleas Koch, NP    Chief Complaint  Patient presents with  . Nausea  . Diarrhea    Brief Narrative:    Devin Howell Devin Howell is a 70 y.o. male with medical history significant of CKD stage 2, HTN, HLD, hypothyroidism, BPH, anxiety/depression, gout, presented with persistent nausea vomiting diarrhea.  Patient traveled to [place] and [place] area for business trip for 2 total of two nights with his wife and came back early this week and then developed nauseous vomit and diarrhea.  Nonbloody nonbilious.  Able to tolerate only small amount of Pasta, and able to drink only small sips of water, no abd pain.  Diarrhea has been watery, 3-5 times a day.  Denies any fever chills and wife does not have any GI symtpoms.  He has been feeling so dehydrated for the last 2 days, this morning he got up to go to the bathroom however, suddenly felt lightheaded and fell down hit right eye and right-sided forehead against wall and fell down. No LOC. ED Course: Blood work found AKI creatinine 2.9 compared to baseline 1.5-2.  CT head showed small left parietal-occipital arachnoid hemorrhage, trace subdural hematoma over the left cerebral convexity without mass-effect.  Assessment & Plan:   Active Problems:   AKI (acute kidney injury) ([place])   Acute gastroenteritis -Patient presents with significant nausea and vomiting, this is most likely due to gastroenteritis, likely viral in etiology, acute GI findings CT abdomen and pelvis. -Diarrhea has improved, had soft bowel movement today, will DC isolation, not a candidate for GI panel -Remains poor, will keep on IV fluids specially in the setting of dehydration and renal failure -Continue with as needed Zofran  AKI on CKD stage II -Severely dehydrated, appetite remains very poor given significant nausea, continue with IV fluids at current rate, baseline creatinine  1.5, peaked at 3.2, mildly improving but significantly elevated today at 2.7.   -Avoid nephrotoxic medications  Occipital- parietal subarachnoid hemorrhage/subdural hematoma -Neuro exam benign -Neurosurgery consulted, not a surgical candidate, no further work-up -Stable repeat CT head this morning  Near syncope -From dehydration  Elevated LFT -Likely due to fatty liver, negative hepatitis panel  BPH with urinary retention on CAT scan -Continue Flomax -Bladder scan, straight cath if volume more than 300 mL.  Hypothyroidism -Continue Synthroid.  Thrombocytopenia -Appears to be chronic, this is most likely due to fatty liver   DVT prophylaxis: SCD, no chemical prophylaxis due to subarachnoid hemorrhage Code Status: Full Family Communication: None at bedside Disposition:   Status is: Inpatient  Remains inpatient appropriate because:Ongoing diagnostic testing needed not appropriate for outpatient work up and IV treatments appropriate due to intensity of illness or inability to take PO   Dispo: The patient is from: Home              Anticipated d/c is to: Home              Patient currently is not medically stable to d/c.   Difficult to place patient No       Consultants:   Neurosurgery   Subjective:  Ports nausea, vomiting overnight, this has improved, reports generalized weakness and feeling ill  Objective: Vitals:   08/15/20 1822 08/15/20 2045 08/16/20 0357 08/16/20 0758  BP: (!) 147/69 140/64 (!) 149/65 (!) 144/68  Pulse: 90 83 92 90  Resp: 16 (!) 21 20 18   Temp: 98 F (36.7  C) 98.9 F (37.2 C) 98.6 F (37 C) 98.6 F (37 C)  TempSrc: Oral Oral Oral Oral  SpO2: 92% 94% 94% 96%  Weight:      Height:        Intake/Output Summary (Last 24 hours) at 08/16/2020 1422 Last data filed at 08/16/2020 1238 Gross per 24 hour  Intake 3781.25 ml  Output 1486 ml  Net 2295.25 ml   Filed Weights   08/15/20 1350  Weight: 104.3 kg     Examination:  General exam: Appears calm and comfortable  Respiratory system: Clear to auscultation. Respiratory effort normal. Cardiovascular system: S1 & S2 heard, RRR. No JVD, murmurs, rubs, gallops or clicks. No pedal edema. Gastrointestinal system: Abdomen is nondistended, soft and nontender. No organomegaly or masses felt. Normal bowel sounds heard. Central nervous system: Alert and oriented. No focal neurological deficits. Extremities: Symmetric 5 x 5 power. Skin: No rashes, lesions or ulcers right periorbital bruising,  scalp hematoma. Psychiatry: Judgement and insight appear normal. Mood & affect appropriate.     Data Reviewed: I have personally reviewed following labs and imaging studies  CBC: Recent Labs  Lab 08/15/20 1349 08/15/20 1411 08/16/20 0354  WBC 5.8  --  4.8  NEUTROABS 4.1  --   --   HGB 13.5 13.3 12.1*  HCT 38.8* 39.0 35.1*  MCV 102.9*  --  103.2*  PLT 102*  --  82*    Basic Metabolic Panel: Recent Labs  Lab 08/15/20 1349 08/15/20 1411 08/16/20 0354  NA 133* 132* 138  K 3.6 3.6 3.5  CL 88* 93* 94*  CO2 23  --  24  GLUCOSE 79 81 86  BUN 41* 39* 37*  CREATININE 2.95* 3.20* 2.73*  CALCIUM 9.0  --  8.4*    GFR: Estimated Creatinine Clearance: 29.1 mL/min (A) (by C-G formula based on SCr of 2.73 mg/dL (H)).  Liver Function Tests: Recent Labs  Lab 08/15/20 1349 08/16/20 0354  AST 58* 44*  ALT 48* 40  ALKPHOS 83 69  BILITOT 1.7* 2.7*  PROT 6.8 6.1*  ALBUMIN 3.9 3.3*    CBG: Recent Labs  Lab 08/15/20 1610  GLUCAP 84     Recent Results (from the past 240 hour(s))  Resp Panel by RT-PCR (Flu A&B, Covid) Nasopharyngeal Swab     Status: None   Collection Time: 08/15/20  4:13 PM   Specimen: Nasopharyngeal Swab; Nasopharyngeal(NP) swabs in vial transport medium  Result Value Ref Range Status   SARS Coronavirus 2 by RT PCR NEGATIVE NEGATIVE Final    Comment: (NOTE) SARS-CoV-2 target nucleic acids are NOT DETECTED.  The  SARS-CoV-2 RNA is generally detectable in upper respiratory specimens during the acute phase of infection. The lowest concentration of SARS-CoV-2 viral copies this assay can detect is 138 copies/mL. A negative result does not preclude SARS-Cov-2 infection and should not be used as the sole basis for treatment or other patient management decisions. A negative result may occur with  improper specimen collection/handling, submission of specimen other than nasopharyngeal swab, presence of viral mutation(s) within the areas targeted by this assay, and inadequate number of viral copies(<138 copies/mL). A negative result must be combined with clinical observations, patient history, and epidemiological information. The expected result is Negative.  Fact Sheet for Patients:  EntrepreneurPulse.com.au  Fact Sheet for Healthcare Providers:  IncredibleEmployment.be  This test is no t yet approved or cleared by the [place] FDA and  has been authorized for detection and/or diagnosis of SARS-CoV-2 by FDA under  an Emergency Use Authorization (EUA). This EUA will remain  in effect (meaning this test can be used) for the duration of the COVID-19 declaration under Section 564(b)(1) of the Act, 21 U.S.C.section 360bbb-3(b)(1), unless the authorization is terminated  or revoked sooner.       Influenza A by PCR NEGATIVE NEGATIVE Final   Influenza B by PCR NEGATIVE NEGATIVE Final    Comment: (NOTE) The Xpert Xpress SARS-CoV-2/FLU/RSV plus assay is intended as an aid in the diagnosis of influenza from Nasopharyngeal swab specimens and should not be used as a sole basis for treatment. Nasal washings and aspirates are unacceptable for Xpert Xpress SARS-CoV-2/FLU/RSV testing.  Fact Sheet for Patients: EntrepreneurPulse.com.au  Fact Sheet for Healthcare Providers: IncredibleEmployment.be  This test is not yet approved or  cleared by the [place] FDA and has been authorized for detection and/or diagnosis of SARS-CoV-2 by FDA under an Emergency Use Authorization (EUA). This EUA will remain in effect (meaning this test can be used) for the duration of the COVID-19 declaration under Section 564(b)(1) of the Act, 21 U.S.C. section 360bbb-3(b)(1), unless the authorization is terminated or revoked.  Performed at [facility], [place] [address] [place], [place] [address]          Radiology Studies: CT ABDOMEN PELVIS WO CONTRAST  Result Date: 08/15/2020 CLINICAL DATA:  Nausea and vomiting for several days EXAM: CT ABDOMEN AND PELVIS WITHOUT CONTRAST TECHNIQUE: Multidetector CT imaging of the abdomen and pelvis was performed following the standard protocol without IV contrast. COMPARISON:  05/30/2014 FINDINGS: Lower chest: Lung bases demonstrate some very mild scarring bilaterally. Hepatobiliary: Fatty infiltration of the liver is noted. The gallbladder is well distended. Pancreas: Unremarkable. No pancreatic ductal dilatation or surrounding inflammatory changes. Spleen: Normal in size without focal abnormality. Adrenals/Urinary Tract: Adrenal glands are within normal limits. Kidneys demonstrate tiny nonobstructing calculi bilaterally. The ureters are well visualized and within normal limits. Scarring of the left kidney is seen related to prior obstructive changes. Bladder is well distended. Stomach/Bowel: No obstructive or inflammatory changes of the colon are seen. The appendix is within normal limits. No small bowel or gastric abnormality is seen. Vascular/Lymphatic: Aortic atherosclerosis. No enlarged abdominal or pelvic lymph nodes. Reproductive: Prostate is unremarkable. Other: No abdominal wall hernia or abnormality. No abdominopelvic ascites. Musculoskeletal: Degenerative changes of lumbar spine are noted. IMPRESSION: Fatty liver. Tiny nonobstructing renal calculi bilaterally. Left renal scarring stable  from the prior study. Aortic Atherosclerosis (ICD10-I70.0). Electronically Signed   By: Inez Catalina M.D.   On: 08/15/2020 15:31   CT HEAD WO CONTRAST  Result Date: 08/16/2020 CLINICAL DATA:  70 year old male status post fall with trace subdural and subarachnoid hemorrhage. EXAM: CT HEAD WITHOUT CONTRAST TECHNIQUE: Contiguous axial images were obtained from the base of the skull through the vertex without intravenous contrast. COMPARISON:  Head CT 08/15/2020. FINDINGS: Brain: Trace (2-3 mm) left side subdural hematoma again suspected on series 5, image 34 and stable since yesterday. Superimposed trace occipital sulci subarachnoid hemorrhage also appears stable (coronal image 55). No other intracranial hemorrhage identified. No intracranial mass effect or midline shift. No ventriculomegaly. Gray-white matter differentiation is stable and within normal limits for age. No cortically based acute infarct identified. Vascular: Calcified atherosclerosis at the skull base. No suspicious intracranial vascular hyperdensity. Skull: Stable.  No fracture identified. Sinuses/Orbits: Visualized paranasal sinuses and mastoids are stable and well aerated. Other: Posterior midline scalp hematoma and/or laceration is stable on series 4, image 67. Underlying calvarium appears intact. Otherwise stable  and negative orbit and scalp soft tissues. IMPRESSION: 1. Stable trace left side subdural hematoma and trace occipital subarachnoid hemorrhage since yesterday. No intracranial mass effect or midline shift. 2. No new intracranial abnormality. 3. Stable posterior midline scalp hematoma and/or laceration. No skull fracture identified. Electronically Signed   By: Genevie Ann M.D.   On: 08/16/2020 11:51   CT Head Wo Contrast  Result Date: 08/15/2020 CLINICAL DATA:  Head trauma. Fall last night with right eye swelling and redness. EXAM: CT HEAD WITHOUT CONTRAST TECHNIQUE: Contiguous axial images were obtained from the base of the skull  through the vertex without intravenous contrast. COMPARISON:  None. FINDINGS: Brain: There is no evidence of an acute infarct, mass, or midline shift. There is trace left parieto-occipital subarachnoid hemorrhage. Trace subdural hemorrhage over the left cerebral convexity measures up to 3 mm in thickness without mass effect. Mild cerebral atrophy is within normal limits for age. Hypodensities in the cerebral white matter bilaterally are nonspecific but compatible with mild chronic small vessel ischemic disease. Vascular: Calcified atherosclerosis at the skull base. No hyperdense vessel. Skull: No fracture or suspicious osseous lesion. Sinuses/Orbits: Orbits more fully evaluated on dedicated orbital CT. Clear paranasal sinuses and mastoid air cells. Other: Small parietal scalp hematoma. IMPRESSION: 1. Trace left parieto-occipital subarachnoid hemorrhage. 2. Trace subdural hemorrhage over the left cerebral convexity without mass effect. 3. Small parietal scalp hematoma. Critical Value/emergent results were called by telephone at the time of interpretation on 08/15/2020 at 3:58 pm to provider Benedetto Goad, who verbally acknowledged these results. Electronically Signed   By: Logan Bores M.D.   On: 08/15/2020 15:59   DG Chest Port 1 View  Result Date: 08/15/2020 CLINICAL DATA:  Nausea and diarrhea EXAM: PORTABLE CHEST 1 VIEW COMPARISON:  None. FINDINGS: The heart size and mediastinal contours are within normal limits. Both lungs are clear. The visualized skeletal structures are unremarkable. IMPRESSION: No active disease. Electronically Signed   By: Ulyses Jarred M.D.   On: 08/15/2020 20:36   CT OrbitsS W/O CM  Result Date: 08/15/2020 CLINICAL DATA:  Recent fall with right eye pain and swelling, initial encounter EXAM: CT ORBITS WITHOUT CONTRAST TECHNIQUE: Multidetector CT imaging of the orbits was performed using the standard protocol without intravenous contrast. Multiplanar CT image reconstructions were also  generated. COMPARISON:  No pertinent prior exam. FINDINGS: Orbits: Orbits and their contents are within normal limits. No postseptal soft tissue edema is seen. Very mild soft tissue swelling over the right eye is noted consistent with the recent injury. Visible paranasal sinuses: Paranasal sinuses are well aerated without focal abnormality. Soft tissues: Surrounding soft tissue structures are otherwise within normal limits aside from the periorbital swelling on the right. Osseous: No acute bony abnormality is seen. Limited intracranial: Limited intracranial views show mild atrophic changes. IMPRESSION: Mild periorbital swelling without intraorbital abnormality. No bony abnormality is noted. Electronically Signed   By: Inez Catalina M.D.   On: 08/15/2020 15:23   US Abdomen Limited RUQ (LIVER/GB)  Result Date: 08/16/2020 CLINICAL DATA:  Liver function test elevation. EXAM: ULTRASOUND ABDOMEN LIMITED RIGHT UPPER QUADRANT COMPARISON:  CT abdomen pelvis 08/15/2020 FINDINGS: Gallbladder: Sludge noted within the gallbladder lumen. No gallstones or wall thickening visualized. No sonographic Murphy sign noted by sonographer. Common bile duct: Diameter: 6 mm. Liver: No focal lesion identified. Increased parenchymal echogenicity. Portal vein is patent on color Doppler imaging with normal direction of blood flow towards the liver. Other: None. IMPRESSION: 1. Hepatic steatosis. Please note limited evaluation for  focal hepatic masses in a patient with hepatic steatosis due to decreased penetration of the acoustic ultrasound waves. 2. Gallbladder sludge with no findings of acute cholecystitis. Electronically Signed   By: Iven Finn M.D.   On: 08/16/2020 05:24        Scheduled Meds: . acidophilus  2 capsule Oral TID  . allopurinol  300 mg Oral Daily  . amLODipine  5 mg Oral Daily  . citalopram  20 mg Oral Daily  . levothyroxine  50 mcg Oral Q0600  . metoprolol succinate  25 mg Oral Daily  . tamsulosin  0.4 mg  Oral QHS   Continuous Infusions: . sodium chloride 75 mL/hr at 08/16/20 0801     LOS: 1 day      Phillips Climes, MD [facility]   To contact the attending provider between 7A-7P or the covering provider during after hours 7P-7A, please log into the web site [email or web address] and access using universal [place] password for that web site. If you do not have the password, please call the hospital operator.  08/16/2020, 2:22 PM

## 2020-08-16 NOTE — Evaluation (Addendum)
Physical Therapy Evaluation Patient Details Name: Devin Howell MRN: [id number] DOB: 1950/10/26 Today's Date: 08/16/2020   History of Present Illness  The pt is a 70 yo male presenting 5/20 due to 1.5-2 weeks of nausea, vomiting, and diarrhea. Pt also reporting he fell when getting up to go to the bathroom overnight prior to admission, noted to have bruise on R eye upon admission. Imaging revealed trace SAH of L parietal and occipital lobes with associated SAH. Pt also found to have AKI. PMH includes: gout, CKD, HLD, HTN, and sleep apnea (uses cpap).    Clinical Impression  Pt in bed upon arrival of PT, agreeable to evaluation at this time. Prior to admission the pt was independent with all mobility, ADLs, and IADLs without need for AD. The pt now presents with limitations in functional mobility, dynamic stability, endurance, and safety awareness due to above dx, and will continue to benefit from skilled PT to address these deficits. The pt was able to demo good independence with bed mobility, but does demo increased work of breathing after each transition, minA to [place] needed with initial stand as pt reaching for BUE support to steady in all standing positions. The pt was able to progress to hallway ambulation and demos good stability with use of RW in open hallway, however, in small spaces such as the pt's room, he presents with significant impulsivity, and flustered affect. The pt demos decreased safety awareness, needing repeated cues for slowed movements, transitions, and use of DME to mobilize safely. Recommend continued skilled PT acutely as well as HHPT to facilitate return to prior level of mobility and independence without need for AD.  VITALS:  - supine - BP: 158/70 (95); HR: 90bpm - sitting EOB - BP: 133/70 (90); HR: 100bpm - standing - BP: 128/62 (80); HR: 96bpm - standing after 3 min - BP: 140/69 (89); HR: 101bpm       Follow Up Recommendations Home health PT;Supervision for  mobility/OOB    Equipment Recommendations  Rolling walker with 5" wheels    Recommendations for Other Services       Precautions / Restrictions Precautions Precautions: Fall Precaution Comments: admitted for fall, concern for orhtostatic hypotension Restrictions Weight Bearing Restrictions: No      Mobility  Bed Mobility Overal bed mobility: Modified Independent             General bed mobility comments: increased time and effort, increased work of breathing with initial transition to sit. no assist given from flat bed    Transfers Overall transfer level: Needs assistance Equipment used: Rolling walker (2 wheeled) Transfers: Sit to/from Stand Sit to Stand: Min assist;Min guard         General transfer comment: pt able to power up without assist, minA to steady with initial stand. pt reaching for UE support, standing with very wide BOS and reluctant to reduce BOS  Ambulation/Gait Ambulation/Gait assistance: Min guard;Mod assist Gait Distance (Feet): 150 Feet Assistive device: Rolling walker (2 wheeled);1 person hand held assist Gait Pattern/deviations: Step-through pattern;Decreased stride length;Trunk flexed Gait velocity: 0.44 m/s Gait velocity interpretation: <1.8 ft/sec, indicate of risk for recurrent falls General Gait Details: in small spaces (pt room) the RW did not fit and therefore the pt was very frantic, reaching for any surface (even those with wheels) to grab to steady. requiring modA frequently to steady in small space. Once given RW, pt cued to increase stride and posture with good adherence, significantly improved stability, less impulsivity.  Stairs Stairs:  Yes Stairs assistance: Min assist Stair Management: One rail Right;Step to pattern;Forwards Number of Stairs: 2 General stair comments: step-to pattern with minA through HHA.     Balance Overall balance assessment: Needs assistance Sitting-balance support: No upper extremity  supported Sitting balance-Leahy Scale: Good Sitting balance - Comments: able to reach to don socks without assist Postural control: Posterior lean Standing balance support: Bilateral upper extremity supported Standing balance-Leahy Scale: Poor Standing balance comment: reliant on BUE support poor safety awareness                             Pertinent Vitals/Pain Pain Assessment: No/denies pain    Home Living Family/patient expects to be discharged to:: Private residence Living Arrangements: Spouse/significant other Available Help at Discharge: Family;Available 24 hours/day Type of Home: Apartment (condo) Home Access: Stairs to enter Entrance Stairs-Rails: Right Entrance Stairs-Number of Steps: 6 stairs down, then landing, then 10 more stairs down Home Layout: One level Home Equipment: Shower seat      Prior Function Level of Independence: Independent         Comments: previously independent without AD, walking 5 miles per day per wife     Hand Dominance        Extremity/Trunk Assessment   Upper Extremity Assessment Upper Extremity Assessment: Overall WFL for tasks assessed    Lower Extremity Assessment Lower Extremity Assessment: Overall WFL for tasks assessed    Cervical / Trunk Assessment Cervical / Trunk Assessment: Kyphotic (large body habitus)  Communication   Communication: No difficulties  Cognition Arousal/Alertness: Awake/alert Behavior During Therapy: Flat affect Overall Cognitive Status: Impaired/Different from baseline Area of Impairment: Following commands;Safety/judgement;Problem solving                       Following Commands: Follows one step commands consistently (repeated cues) Safety/Judgement: Decreased awareness of safety;Decreased awareness of deficits   Problem Solving: Difficulty sequencing;Requires verbal cues General Comments: pt with decreased insight to safety, often needing repeated cues, especially for  positioning or movement. very flat affect, but flustered and moving impulsively when in tight/narrow areas      General Comments General comments (skin integrity, edema, etc.): VSS on RA, pt with 3/4 DOE with donning socks, ambulaiton in hall, SpO2 in 90s throughout    Exercises     Assessment/Plan    PT Assessment Patient needs continued PT services  PT Problem List Decreased strength;Decreased activity tolerance;Decreased range of motion;Decreased balance;Decreased mobility;Decreased cognition;Decreased safety awareness;Decreased knowledge of precautions       PT Treatment Interventions DME instruction;Gait training;Stair training;Functional mobility training;Therapeutic activities;Therapeutic exercise;Balance training;Patient/family education    PT Goals (Current goals can be found in the Care Plan section)  Acute Rehab PT Goals Patient Stated Goal: get back to walking 5 miles/day PT Goal Formulation: With patient Time For Goal Achievement: 08/30/20 Potential to Achieve Goals: Fair    Frequency Min 3X/week    AM-PAC PT "6 Clicks" Mobility  Outcome Measure Help needed turning from your back to your side while in a flat bed without using bedrails?: None Help needed moving from lying on your back to sitting on the side of a flat bed without using bedrails?: None Help needed moving to and from a bed to a chair (including a wheelchair)?: A Little Help needed standing up from a chair using your arms (e.g., wheelchair or bedside chair)?: A Little Help needed to walk in hospital room?: A Lot Help needed  climbing 3-5 steps with a railing? : A Lot 6 Click Score: 18    End of Session Equipment Utilized During Treatment: Gait belt Activity Tolerance: Patient tolerated treatment well Patient left: in chair;with call bell/phone within reach;with family/visitor present Nurse Communication: Mobility status PT Visit Diagnosis: Other abnormalities of gait and mobility (R26.89)    Time:  1351-1435 PT Time Calculation (min) (ACUTE ONLY): 44 min   Charges:   PT Evaluation $PT Eval Low Complexity: 1 Low PT Treatments $Gait Training: 8-22 mins $Therapeutic Activity: 8-22 mins        Karma Ganja, PT, DPT   Acute Rehabilitation Department Pager #: [phone]  Otho Bellows 08/16/2020, 2:59 PM

## 2020-08-16 NOTE — Progress Notes (Signed)
Patient has had 2 episodes of emesis, Zofran given at 0139. PT states that emesis "comes out of nowhere" no nausea, pt remains alert and oriented with no mental status changes.  Dr. Tonie Griffith notified: ordered more Zofran and possible repeat CT scan in the AM.

## 2020-08-17 LAB — BASIC METABOLIC PANEL
Anion gap: 13 (ref 5–15)
BUN: 28 mg/dL — ABNORMAL HIGH (ref 8–23)
CO2: 28 mmol/L (ref 22–32)
Calcium: 8.3 mg/dL — ABNORMAL LOW (ref 8.9–10.3)
Chloride: 97 mmol/L — ABNORMAL LOW (ref 98–111)
Creatinine, Ser: 1.99 mg/dL — ABNORMAL HIGH (ref 0.61–1.24)
GFR, Estimated: 35 mL/min — ABNORMAL LOW (ref >60)
Glucose, Bld: 105 mg/dL — ABNORMAL HIGH (ref 70–99)
Potassium: 3.2 mmol/L — ABNORMAL LOW (ref 3.5–5.1)
Sodium: 138 mmol/L (ref 135–145)

## 2020-08-17 LAB — CBC
HCT: 32.8 % — ABNORMAL LOW (ref 39.0–52.0)
Hemoglobin: 11 g/dL — ABNORMAL LOW (ref 13.0–17.0)
MCH: 35.6 pg — ABNORMAL HIGH (ref 26.0–34.0)
MCHC: 33.5 g/dL (ref 30.0–36.0)
MCV: 106.1 fL — ABNORMAL HIGH (ref 80.0–100.0)
Platelets: 80 10*3/uL — ABNORMAL LOW (ref 150–400)
RBC: 3.09 MIL/uL — ABNORMAL LOW (ref 4.22–5.81)
RDW: 14.4 % (ref 11.5–15.5)
WBC: 3.7 10*3/uL — ABNORMAL LOW (ref 4.0–10.5)
nRBC: 0 % (ref 0.0–0.2)

## 2020-08-17 LAB — PHOSPHORUS: Phosphorus: 2.1 mg/dL — ABNORMAL LOW (ref 2.5–4.6)

## 2020-08-17 LAB — MAGNESIUM: Magnesium: 1.4 mg/dL — ABNORMAL LOW (ref 1.7–2.4)

## 2020-08-17 MED ORDER — MAGNESIUM SULFATE 4 GM/100ML IV SOLN
4.0000 g | Freq: Once | INTRAVENOUS | Status: AC
  Administered 2020-08-17: 4 g via INTRAVENOUS
  Filled 2020-08-17: qty 100

## 2020-08-17 MED ORDER — POTASSIUM CHLORIDE CRYS ER 20 MEQ PO TBCR
40.0000 meq | EXTENDED_RELEASE_TABLET | Freq: Once | ORAL | Status: AC
  Administered 2020-08-17: 40 meq via ORAL
  Filled 2020-08-17: qty 2

## 2020-08-17 MED ORDER — POTASSIUM PHOSPHATES 15 MMOLE/5ML IV SOLN
30.0000 mmol | Freq: Once | INTRAVENOUS | Status: AC
  Administered 2020-08-17: 30 mmol via INTRAVENOUS
  Filled 2020-08-17: qty 10

## 2020-08-17 NOTE — TOC Initial Note (Signed)
Transition of Care [facility]) - Initial/Assessment Note    Patient Details  Name: Devin Howell MRN: [id number] Date of Birth: 1950/12/15  Transition of Care [facility]) CM/SW Contact:    Pollie Friar, RN Phone Number: 08/17/2020, 4:24 PM  Clinical Narrative:                 Patient lives home with his spouse that can provide 24 hour supervision. Recommendations for [facility] services. Pt has no preference on agency. CM attempting to arrange the services.  Walker for home ordered through [facility] and will be delivered to the room. TOC following.  Expected Discharge Plan: [place] Barriers to Discharge: Continued Medical Work up   Patient Goals and CMS Choice   CMS Medicare.gov Compare Post Acute Care list provided to:: Patient Choice offered to / list presented to : Patient  Expected Discharge Plan and Services Expected Discharge Plan: [place]   Discharge Planning Services: CM Consult Post Acute Care Choice: [place] arrangements for the past 2 months: Single Family Home                 DME Arranged: Walker rolling DME Agency: [employer] Date DME Agency Contacted: 08/17/20   Representative spoke with at DME Agency: Freda Munro [facility] Arranged: PT          Prior Living Arrangements/Services Living arrangements for the past 2 months: Single Family Home Lives with:: Spouse Patient language and need for interpreter reviewed:: Yes Do you feel safe going back to the place where you live?: Yes      Need for Family Participation in Patient Care: Yes (Comment) Care giver support system in place?: Yes (comment) Current home services: DME (crutches) Criminal Activity/Legal Involvement Pertinent to Current Situation/Hospitalization: No - Comment as needed  Activities of Daily Living Home Assistive Devices/Equipment: None ADL Screening (condition at time of admission) Patient's cognitive ability adequate to safely  complete daily activities?: Yes Is the patient deaf or have difficulty hearing?: No Does the patient have difficulty seeing, even when wearing glasses/contacts?: No Does the patient have difficulty concentrating, remembering, or making decisions?: No Patient able to express need for assistance with ADLs?: Yes Does the patient have difficulty dressing or bathing?: No Independently performs ADLs?: Yes (appropriate for developmental age) Does the patient have difficulty walking or climbing stairs?: No Weakness of Legs: Both Weakness of Arms/Hands: None  Permission Sought/Granted                  Emotional Assessment Appearance:: Appears stated age Attitude/Demeanor/Rapport: Engaged Affect (typically observed): Accepting Orientation: : Oriented to Self,Oriented to Place,Oriented to  Time,Oriented to Situation   Psych Involvement: No (comment)  Admission diagnosis:  Dehydration [E86.0] Subdural hematoma ([place]) [S06.5X9A] AKI (acute kidney injury) ([place]) [N17.9] LFT elevation [R79.89] Fall, initial encounter [W19.XXXA] Subarachnoid hematoma, with loss of consciousness of 30 minutes or less, initial encounter [facility]) [G28.3M6Q] Patient Active Problem List   Diagnosis Date Noted  . Nausea vomiting and diarrhea 08/15/2020  . AKI (acute kidney injury) ([place]) 08/15/2020  . Subdural hematoma ([place])   . Subarachnoid hematoma ([place])   . LFT elevation   . Hypothyroidism 02/13/2019  . Dizziness 11/16/2017  . Hx of adenomatous polyp of colon 04/12/2017  . Anxiety and depression 03/01/2017  . Erectile dysfunction 12/13/2016  . Preventative health care 12/13/2016  . Major depressive disorder 12/13/2016  . Hiccups 06/10/2016  . Chronic gout without tophus 04/20/2016  . Genital herpes  simplex 04/20/2016  . Testosterone deficiency 04/20/2016  . CKD (chronic kidney disease) 04/20/2016  . Essential hypertension 04/20/2016  . Hyperlipidemia 04/20/2016   PCP:  Pleas Koch, NP Pharmacy:    [facility] [place], [place] - [address] [place] [address] Phone: [phone] Fax: [phone]  [id number] [address] [place], [place]  [address] Phone: [phone] Fax: [phone]  [id number] [address], [place] [address] Phone: [phone] Fax: [phone]     Social Determinants of Health (SDOH) Interventions    Readmission Risk Interventions No flowsheet data found.

## 2020-08-17 NOTE — Progress Notes (Addendum)
PROGRESS NOTE    Devin Howell  ZOX:[id number] DOB: 08/07/1950 DOA: 08/15/2020 PCP: Pleas Koch, NP    Chief Complaint  Patient presents with  . Nausea  . Diarrhea    Brief Narrative:    Devin Howell is a 70 y.o. male with medical history significant of CKD stage 2, HTN, HLD, hypothyroidism, BPH, anxiety/depression, gout, presented with persistent nausea vomiting diarrhea.  Patient traveled to [place] and [place] area for business trip for 2 total of two nights with his wife and came back early this week and then developed nauseous vomit and diarrhea.  Nonbloody nonbilious.  Able to tolerate only small amount of Pasta, and able to drink only small sips of water, no abd pain.  Diarrhea has been watery, 3-5 times a day.  Denies any fever chills and wife does not have any GI symtpoms.  He has been feeling so dehydrated for the last 2 days, this morning he got up to go to the bathroom however, suddenly felt lightheaded and fell down hit right eye and right-sided forehead against wall and fell down. No LOC. ED Course: Blood work found AKI creatinine 2.9 compared to baseline 1.5-2.  CT head showed small left parietal-occipital arachnoid hemorrhage, trace subdural hematoma over the left cerebral convexity without mass-effect.  Assessment & Plan:   Active Problems:   AKI (acute kidney injury) ([place])   Acute gastroenteritis -Patient presents with significant nausea and vomiting, this is most likely due to gastroenteritis, likely viral in etiology, acute GI findings CT abdomen and pelvis. -Diarrhea has improved, he is currently off isolation, no further diarrhea for stool panel -Liquid intake is significantly improved, even though solid intake remains poor, will DC IV fluids specially has developed some lower extremity edema -Continue with as needed Zofran  AKI on CKD stage II -Severely dehydrated, appetite remains very poor given significant nausea, baseline  creatinine 1.5, peaked at 3.2, mildly improving but significantly elevated today at 1.99 -Avoid nephrotoxic medications -DC IV fluid giving some lower extremity edema  Occipital- parietal subarachnoid hemorrhage/subdural hematoma -Neuro exam benign -Neurosurgery consulted, not a surgical candidate, no further work-up -Stable repeat CT head this morning  Near syncope -From dehydration  Elevated LFT -Likely due to fatty liver, negative hepatitis panel  BPH with urinary retention on CAT scan -Continue Flomax -He denies any further retention  Hypothyroidism -Continue Synthroid.  Thrombocytopenia -Appears to be chronic, this is most likely due to fatty liver  Hypokalemia/hypophosphatemia/hypomagnesemia -Repleted   DVT prophylaxis: SCD, no chemical prophylaxis due to subarachnoid hemorrhage Code Status: Full Family Communication: None at bedside Disposition:   Status is: Inpatient  Remains inpatient appropriate because:Ongoing diagnostic testing needed not appropriate for outpatient work up and IV treatments appropriate due to intensity of illness or inability to take PO   Dispo: The patient is from: Home              Anticipated d/c is to: Home              Patient currently is not medically stable to d/c.   Difficult to place patient No       Consultants:   Neurosurgery   Subjective:  Nausea or vomiting, no diarrhea, had 1 bowel movement yesterday, 2 this morning, semisolid, reports improved oral intake, mainly liquids, but appetite slightly poor.     Objective: Vitals:   08/16/20 1647 08/16/20 2109 08/17/20 0433 08/17/20 0913  BP: 134/70 131/60 133/63 134/62  Pulse: 87 74 72 83  Resp: 18 16 20 18   Temp: 98.7 F (37.1 C) 99.1 F (37.3 C) 98.4 F (36.9 C) 98.4 F (36.9 C)  TempSrc: Oral Oral Oral   SpO2: 94% 93% 93% 94%  Weight:      Height:        Intake/Output Summary (Last 24 hours) at 08/17/2020 1152 Last data filed at 08/17/2020  0900 Gross per 24 hour  Intake 2664.39 ml  Output 775 ml  Net 1889.39 ml   Filed Weights   08/15/20 1350  Weight: 104.3 kg    Examination:  Awake Alert, Oriented X 3, No new F.N deficits, Normal affect, patient with right thigh periorbital bruising, and left posterior scalp bruise (no open wound) Symmetrical Chest wall movement, Good air movement bilaterally, CTAB RRR,No Gallops,Rubs or new Murmurs, No Parasternal Heave +ve B.Sounds, Abd Soft, No tenderness, No rebound - guarding or rigidity. No Cyanosis, Clubbing , +1 edema, No new Rash or bruise       Data Reviewed: I have personally reviewed following labs and imaging studies  CBC: Recent Labs  Lab 08/15/20 1349 08/15/20 1411 08/16/20 0354 08/17/20 0148  WBC 5.8  --  4.8 3.7*  NEUTROABS 4.1  --   --   --   HGB 13.5 13.3 12.1* 11.0*  HCT 38.8* 39.0 35.1* 32.8*  MCV 102.9*  --  103.2* 106.1*  PLT 102*  --  82* 80*    Basic Metabolic Panel: Recent Labs  Lab 08/15/20 1349 08/15/20 1411 08/16/20 0354 08/17/20 0148  NA 133* 132* 138 138  K 3.6 3.6 3.5 3.2*  CL 88* 93* 94* 97*  CO2 23  --  24 28  GLUCOSE 79 81 86 105*  BUN 41* 39* 37* 28*  CREATININE 2.95* 3.20* 2.73* 1.99*  CALCIUM 9.0  --  8.4* 8.3*  MG  --   --   --  1.4*  PHOS  --   --   --  2.1*    GFR: Estimated Creatinine Clearance: 39.9 mL/min (A) (by C-G formula based on SCr of 1.99 mg/dL (H)).  Liver Function Tests: Recent Labs  Lab 08/15/20 1349 08/16/20 0354  AST 58* 44*  ALT 48* 40  ALKPHOS 83 69  BILITOT 1.7* 2.7*  PROT 6.8 6.1*  ALBUMIN 3.9 3.3*    CBG: Recent Labs  Lab 08/15/20 1610  GLUCAP 84     Recent Results (from the past 240 hour(s))  Resp Panel by RT-PCR (Flu A&B, Covid) Nasopharyngeal Swab     Status: None   Collection Time: 08/15/20  4:13 PM   Specimen: Nasopharyngeal Swab; Nasopharyngeal(NP) swabs in vial transport medium  Result Value Ref Range Status   SARS Coronavirus 2 by RT PCR NEGATIVE NEGATIVE Final     Comment: (NOTE) SARS-CoV-2 target nucleic acids are NOT DETECTED.  The SARS-CoV-2 RNA is generally detectable in upper respiratory specimens during the acute phase of infection. The lowest concentration of SARS-CoV-2 viral copies this assay can detect is 138 copies/mL. A negative result does not preclude SARS-Cov-2 infection and should not be used as the sole basis for treatment or other patient management decisions. A negative result may occur with  improper specimen collection/handling, submission of specimen other than nasopharyngeal swab, presence of viral mutation(s) within the areas targeted by this assay, and inadequate number of viral copies(<138 copies/mL). A negative result must be combined with clinical observations, patient history, and epidemiological information. The expected result is Negative.  Fact Sheet for Patients:  EntrepreneurPulse.com.au  Fact Sheet  for Healthcare Providers:  IncredibleEmployment.be  This test is no t yet approved or cleared by the [place] and  has been authorized for detection and/or diagnosis of SARS-CoV-2 by FDA under an Emergency Use Authorization (EUA). This EUA will remain  in effect (meaning this test can be used) for the duration of the COVID-19 declaration under Section 564(b)(1) of the Act, 21 U.S.C.section 360bbb-3(b)(1), unless the authorization is terminated  or revoked sooner.       Influenza A by PCR NEGATIVE NEGATIVE Final   Influenza B by PCR NEGATIVE NEGATIVE Final    Comment: (NOTE) The Xpert Xpress SARS-CoV-2/FLU/RSV plus assay is intended as an aid in the diagnosis of influenza from Nasopharyngeal swab specimens and should not be used as a sole basis for treatment. Nasal washings and aspirates are unacceptable for Xpert Xpress SARS-CoV-2/FLU/RSV testing.  Fact Sheet for Patients: EntrepreneurPulse.com.au  Fact Sheet for Healthcare  Providers: IncredibleEmployment.be  This test is not yet approved or cleared by the [place] FDA and has been authorized for detection and/or diagnosis of SARS-CoV-2 by FDA under an Emergency Use Authorization (EUA). This EUA will remain in effect (meaning this test can be used) for the duration of the COVID-19 declaration under Section 564(b)(1) of the Act, 21 U.S.C. section 360bbb-3(b)(1), unless the authorization is terminated or revoked.  Performed at [facility], [place] [address] [place], [place] [address]          Radiology Studies: CT ABDOMEN PELVIS WO CONTRAST  Result Date: 08/15/2020 CLINICAL DATA:  Nausea and vomiting for several days EXAM: CT ABDOMEN AND PELVIS WITHOUT CONTRAST TECHNIQUE: Multidetector CT imaging of the abdomen and pelvis was performed following the standard protocol without IV contrast. COMPARISON:  05/30/2014 FINDINGS: Lower chest: Lung bases demonstrate some very mild scarring bilaterally. Hepatobiliary: Fatty infiltration of the liver is noted. The gallbladder is well distended. Pancreas: Unremarkable. No pancreatic ductal dilatation or surrounding inflammatory changes. Spleen: Normal in size without focal abnormality. Adrenals/Urinary Tract: Adrenal glands are within normal limits. Kidneys demonstrate tiny nonobstructing calculi bilaterally. The ureters are well visualized and within normal limits. Scarring of the left kidney is seen related to prior obstructive changes. Bladder is well distended. Stomach/Bowel: No obstructive or inflammatory changes of the colon are seen. The appendix is within normal limits. No small bowel or gastric abnormality is seen. Vascular/Lymphatic: Aortic atherosclerosis. No enlarged abdominal or pelvic lymph nodes. Reproductive: Prostate is unremarkable. Other: No abdominal wall hernia or abnormality. No abdominopelvic ascites. Musculoskeletal: Degenerative changes of lumbar spine are noted. IMPRESSION:  Fatty liver. Tiny nonobstructing renal calculi bilaterally. Left renal scarring stable from the prior study. Aortic Atherosclerosis (ICD10-I70.0). Electronically Signed   By: Inez Catalina M.D.   On: 08/15/2020 15:31   CT HEAD WO CONTRAST  Result Date: 08/16/2020 CLINICAL DATA:  70 year old male status post fall with trace subdural and subarachnoid hemorrhage. EXAM: CT HEAD WITHOUT CONTRAST TECHNIQUE: Contiguous axial images were obtained from the base of the skull through the vertex without intravenous contrast. COMPARISON:  Head CT 08/15/2020. FINDINGS: Brain: Trace (2-3 mm) left side subdural hematoma again suspected on series 5, image 34 and stable since yesterday. Superimposed trace occipital sulci subarachnoid hemorrhage also appears stable (coronal image 55). No other intracranial hemorrhage identified. No intracranial mass effect or midline shift. No ventriculomegaly. Gray-white matter differentiation is stable and within normal limits for age. No cortically based acute infarct identified. Vascular: Calcified atherosclerosis at the skull base. No suspicious intracranial vascular hyperdensity. Skull: Stable.  No fracture identified. Sinuses/Orbits: Visualized paranasal sinuses and mastoids are stable and well aerated. Other: Posterior midline scalp hematoma and/or laceration is stable on series 4, image 67. Underlying calvarium appears intact. Otherwise stable and negative orbit and scalp soft tissues. IMPRESSION: 1. Stable trace left side subdural hematoma and trace occipital subarachnoid hemorrhage since yesterday. No intracranial mass effect or midline shift. 2. No new intracranial abnormality. 3. Stable posterior midline scalp hematoma and/or laceration. No skull fracture identified. Electronically Signed   By: Genevie Ann M.D.   On: 08/16/2020 11:51   CT Head Wo Contrast  Result Date: 08/15/2020 CLINICAL DATA:  Head trauma. Fall last night with right eye swelling and redness. EXAM: CT HEAD WITHOUT  CONTRAST TECHNIQUE: Contiguous axial images were obtained from the base of the skull through the vertex without intravenous contrast. COMPARISON:  None. FINDINGS: Brain: There is no evidence of an acute infarct, mass, or midline shift. There is trace left parieto-occipital subarachnoid hemorrhage. Trace subdural hemorrhage over the left cerebral convexity measures up to 3 mm in thickness without mass effect. Mild cerebral atrophy is within normal limits for age. Hypodensities in the cerebral white matter bilaterally are nonspecific but compatible with mild chronic small vessel ischemic disease. Vascular: Calcified atherosclerosis at the skull base. No hyperdense vessel. Skull: No fracture or suspicious osseous lesion. Sinuses/Orbits: Orbits more fully evaluated on dedicated orbital CT. Clear paranasal sinuses and mastoid air cells. Other: Small parietal scalp hematoma. IMPRESSION: 1. Trace left parieto-occipital subarachnoid hemorrhage. 2. Trace subdural hemorrhage over the left cerebral convexity without mass effect. 3. Small parietal scalp hematoma. Critical Value/emergent results were called by telephone at the time of interpretation on 08/15/2020 at 3:58 pm to provider Benedetto Goad, who verbally acknowledged these results. Electronically Signed   By: Logan Bores M.D.   On: 08/15/2020 15:59   DG Chest Port 1 View  Result Date: 08/15/2020 CLINICAL DATA:  Nausea and diarrhea EXAM: PORTABLE CHEST 1 VIEW COMPARISON:  None. FINDINGS: The heart size and mediastinal contours are within normal limits. Both lungs are clear. The visualized skeletal structures are unremarkable. IMPRESSION: No active disease. Electronically Signed   By: Ulyses Jarred M.D.   On: 08/15/2020 20:36   CT OrbitsS W/O CM  Result Date: 08/15/2020 CLINICAL DATA:  Recent fall with right eye pain and swelling, initial encounter EXAM: CT ORBITS WITHOUT CONTRAST TECHNIQUE: Multidetector CT imaging of the orbits was performed using the standard  protocol without intravenous contrast. Multiplanar CT image reconstructions were also generated. COMPARISON:  No pertinent prior exam. FINDINGS: Orbits: Orbits and their contents are within normal limits. No postseptal soft tissue edema is seen. Very mild soft tissue swelling over the right eye is noted consistent with the recent injury. Visible paranasal sinuses: Paranasal sinuses are well aerated without focal abnormality. Soft tissues: Surrounding soft tissue structures are otherwise within normal limits aside from the periorbital swelling on the right. Osseous: No acute bony abnormality is seen. Limited intracranial: Limited intracranial views show mild atrophic changes. IMPRESSION: Mild periorbital swelling without intraorbital abnormality. No bony abnormality is noted. Electronically Signed   By: Inez Catalina M.D.   On: 08/15/2020 15:23   US Abdomen Limited RUQ (LIVER/GB)  Result Date: 08/16/2020 CLINICAL DATA:  Liver function test elevation. EXAM: ULTRASOUND ABDOMEN LIMITED RIGHT UPPER QUADRANT COMPARISON:  CT abdomen pelvis 08/15/2020 FINDINGS: Gallbladder: Sludge noted within the gallbladder lumen. No gallstones or wall thickening visualized. No sonographic Murphy sign noted by sonographer. Common bile duct: Diameter: 6 mm. Liver: No  focal lesion identified. Increased parenchymal echogenicity. Portal vein is patent on color Doppler imaging with normal direction of blood flow towards the liver. Other: None. IMPRESSION: 1. Hepatic steatosis. Please note limited evaluation for focal hepatic masses in a patient with hepatic steatosis due to decreased penetration of the acoustic ultrasound waves. 2. Gallbladder sludge with no findings of acute cholecystitis. Electronically Signed   By: Iven Finn M.D.   On: 08/16/2020 05:24        Scheduled Meds: . acidophilus  2 capsule Oral TID  . allopurinol  300 mg Oral Daily  . amLODipine  5 mg Oral Daily  . citalopram  20 mg Oral Daily  . levothyroxine   50 mcg Oral Q0600  . metoprolol succinate  25 mg Oral Daily  . tamsulosin  0.4 mg Oral QHS   Continuous Infusions: . sodium chloride 75 mL/hr at 08/17/20 0352     LOS: 2 days      Devin Climes, MD [facility]   To contact the attending provider between 7A-7P or the covering provider during after hours 7P-7A, please log into the web site [email or web address] and access using universal [place] password for that web site. If you do not have the password, please call the hospital operator.  08/17/2020, 11:52 AM

## 2020-08-17 NOTE — Plan of Care (Signed)
  Problem: Health Behavior/Discharge Planning: Goal: Ability to manage health-related needs will improve Outcome: Completed/Met   Problem: Clinical Measurements: Goal: Complications related to the disease process or treatment will be avoided or minimized Outcome: Completed/Met   Problem: Activity: Goal: Activity intolerance will improve Outcome: Completed/Met   Problem: Fluid Volume: Goal: Fluid volume balance will be maintained or improved Outcome: Completed/Met   Problem: Urinary Elimination: Goal: Progression of disease will be identified and treated Outcome: Completed/Met   Problem: Education: Goal: Knowledge of disease and its progression will improve Outcome: Completed/Met

## 2020-08-18 ENCOUNTER — Encounter (HOSPITAL_COMMUNITY): Payer: Self-pay | Admitting: Internal Medicine

## 2020-08-18 DIAGNOSIS — K760 Fatty (change of) liver, not elsewhere classified: Secondary | ICD-10-CM

## 2020-08-18 DIAGNOSIS — D696 Thrombocytopenia, unspecified: Secondary | ICD-10-CM

## 2020-08-18 DIAGNOSIS — Z79899 Other long term (current) drug therapy: Secondary | ICD-10-CM

## 2020-08-18 DIAGNOSIS — N182 Chronic kidney disease, stage 2 (mild): Secondary | ICD-10-CM

## 2020-08-18 DIAGNOSIS — K529 Noninfective gastroenteritis and colitis, unspecified: Secondary | ICD-10-CM

## 2020-08-18 DIAGNOSIS — D61818 Other pancytopenia: Secondary | ICD-10-CM

## 2020-08-18 LAB — CBC
HCT: 37.4 % — ABNORMAL LOW (ref 39.0–52.0)
Hemoglobin: 12.4 g/dL — ABNORMAL LOW (ref 13.0–17.0)
MCH: 35.9 pg — ABNORMAL HIGH (ref 26.0–34.0)
MCHC: 33.2 g/dL (ref 30.0–36.0)
MCV: 108.4 fL — ABNORMAL HIGH (ref 80.0–100.0)
Platelets: 39 10*3/uL — ABNORMAL LOW (ref 150–400)
RBC: 3.45 MIL/uL — ABNORMAL LOW (ref 4.22–5.81)
RDW: 13.9 % (ref 11.5–15.5)
WBC: 3.7 10*3/uL — ABNORMAL LOW (ref 4.0–10.5)
nRBC: 0 % (ref 0.0–0.2)

## 2020-08-18 LAB — DIC (DISSEMINATED INTRAVASCULAR COAGULATION)PANEL
D-Dimer, Quant: 1.07 ug/mL-FEU — ABNORMAL HIGH (ref 0.00–0.50)
Fibrinogen: 625 mg/dL — ABNORMAL HIGH (ref 210–475)
INR: 1 (ref 0.8–1.2)
Platelets: 99 10*3/uL — ABNORMAL LOW (ref 150–400)
Prothrombin Time: 13.4 seconds (ref 11.4–15.2)
Smear Review: NONE SEEN
aPTT: 25 seconds (ref 24–36)

## 2020-08-18 LAB — SAVE SMEAR(SSMR), FOR PROVIDER SLIDE REVIEW

## 2020-08-18 LAB — BASIC METABOLIC PANEL
Anion gap: 11 (ref 5–15)
BUN: 17 mg/dL (ref 8–23)
CO2: 27 mmol/L (ref 22–32)
Calcium: 8.1 mg/dL — ABNORMAL LOW (ref 8.9–10.3)
Chloride: 96 mmol/L — ABNORMAL LOW (ref 98–111)
Creatinine, Ser: 1.54 mg/dL — ABNORMAL HIGH (ref 0.61–1.24)
GFR, Estimated: 48 mL/min — ABNORMAL LOW (ref >60)
Glucose, Bld: 102 mg/dL — ABNORMAL HIGH (ref 70–99)
Potassium: 3.3 mmol/L — ABNORMAL LOW (ref 3.5–5.1)
Sodium: 134 mmol/L — ABNORMAL LOW (ref 135–145)

## 2020-08-18 LAB — TECHNOLOGIST SMEAR REVIEW

## 2020-08-18 LAB — HEPATIC FUNCTION PANEL
ALT: 34 U/L (ref 0–44)
AST: 38 U/L (ref 15–41)
Albumin: 3.5 g/dL (ref 3.5–5.0)
Alkaline Phosphatase: 75 U/L (ref 38–126)
Bilirubin, Direct: 0.8 mg/dL — ABNORMAL HIGH (ref 0.0–0.2)
Indirect Bilirubin: 1.8 mg/dL — ABNORMAL HIGH (ref 0.3–0.9)
Total Bilirubin: 2.6 mg/dL — ABNORMAL HIGH (ref 0.3–1.2)
Total Protein: 6.7 g/dL (ref 6.5–8.1)

## 2020-08-18 LAB — ABO/RH: ABO/RH(D): A POS

## 2020-08-18 LAB — PHOSPHORUS: Phosphorus: 2.1 mg/dL — ABNORMAL LOW (ref 2.5–4.6)

## 2020-08-18 LAB — MAGNESIUM: Magnesium: 1.6 mg/dL — ABNORMAL LOW (ref 1.7–2.4)

## 2020-08-18 LAB — T4, FREE: Free T4: 1.11 ng/dL (ref 0.61–1.12)

## 2020-08-18 MED ORDER — POTASSIUM CHLORIDE CRYS ER 20 MEQ PO TBCR
40.0000 meq | EXTENDED_RELEASE_TABLET | Freq: Four times a day (QID) | ORAL | Status: AC
  Administered 2020-08-18 (×2): 40 meq via ORAL
  Filled 2020-08-18 (×2): qty 2

## 2020-08-18 MED ORDER — SODIUM CHLORIDE 0.9% IV SOLUTION: Freq: Once | INTRAVENOUS | Status: AC

## 2020-08-18 MED ORDER — SODIUM PHOSPHATES 45 MMOLE/15ML IV SOLN
30.0000 mmol | Freq: Once | INTRAVENOUS | Status: AC
  Administered 2020-08-18: 30 mmol via INTRAVENOUS
  Filled 2020-08-18: qty 10

## 2020-08-18 MED ORDER — MAGNESIUM SULFATE 4 GM/100ML IV SOLN
4.0000 g | Freq: Once | INTRAVENOUS | Status: AC
  Administered 2020-08-18: 4 g via INTRAVENOUS
  Filled 2020-08-18: qty 100

## 2020-08-18 NOTE — Consult Note (Addendum)
[place]  [phone] [phone]  I have seen the patient, examined him and reviewed the plan of care with the patient and his wife  INITIAL HEMATOLOGY CONSULTATION  Referring MD:  Dr. Phillips Climes  Reason for Referral: Pancytopenia  HPI: Mr. Daughety is a 70 year old male with a past medical history significant for CKD stage II, hypertension, hyperlipidemia, hypothyroidism, BPH, anxiety/depression, gout.  He presented to the emergency room with nausea and vomiting.  The patient recently traveled and when he returned developed nausea, vomiting, diarrhea.  He has been feeling dehydrated for the past few days.  When he got up to go to the bathroom, he felt lightheaded and fell down his right eye and right side of the forehead against the wall.  On admission, WBC was 5.8, hemoglobin 13.5, MCV 102.9, platelets 102,000.  His BUN was 41 and creatinine was 2.95 (baseline creatinine 1.5-1.7).  On admission, a CT of the head showed trace left parietal occipital subarachnoid hemorrhage, trace subdural hemorrhage over the left cerebral convexity without mass-effect, small parietal scalp hematoma.  CT of the abdomen/pelvis showed fatty liver.  An ultrasound of his right upper quadrant was obtained which showed hepatic steatosis.    This admission, blood counts have slowly declined and today's CBC shows a WBC of 3.7, hemoglobin 12.4, MCV 108.4, platelets 39,000.  DIC panel was drawn around 10 AM this morning and was notable for elevated fibrinogen of 625, D-dimer 1.07, platelets are now up to 99,000, and smear review showed no schistocytes.  A platelet transfusion has been ordered for today.  His most recent CBC available to me prior to this admission was performed on 02/19/2020 and at that time, his WBC was 3.9, platelets 121,000, hemoglobin 14.8, MCV 104.5.  Looking back at his prior CBCs, he has had intermittent leukopenia and thrombocytopenia for the past 3 to 4  years.  The patient was seen in his hospital room.  His wife is at the bedside.  The patient just returned from walking with physical therapy.  He reports that he did not have any dizziness or lightheadedness with walking.  His abdominal symptoms have improved and he has not having any further nausea, vomiting, diarrhea.  He notes bruising to his right eye and arms from his prior fall.  He denies active bleeding such as epistaxis, hemoptysis, hematemesis, hematuria, melena, hematochezia.  Denies history of tobacco use.  He reports that he drinks 2 to 3 glasses of alcohol per day.  Hematology was asked see the patient make recommendations regarding his pancytopenia.   Past Medical History:  Diagnosis Date   Cataract    Chronic gout    CKD (chronic kidney disease)    pt. denies   Erectile dysfunction    GERD (gastroesophageal reflux disease)    Herpes simplex    Hx of adenomatous polyp of colon 04/12/2017   Hyperlipidemia    pt. denies   Hypertension    pt. denies   OSA on CPAP    Sleep apnea    cpap   Testosterone deficiency   :    Past Surgical History:  Procedure Laterality Date   COLONOSCOPY     TONSILLECTOMY AND ADENOIDECTOMY  1960  :   CURRENT MEDS: Current Facility-Administered Medications  Medication Dose Route Frequency Provider Last Rate Last Admin   acidophilus (RISAQUAD) capsule 2 capsule  2 capsule Oral TID Lequita Halt, MD   2 capsule at 08/17/20 2148   allopurinol (ZYLOPRIM) tablet  300 mg  300 mg Oral Daily Wynetta Fines T, MD   300 mg at 08/17/20 1019   amLODipine (NORVASC) tablet 5 mg  5 mg Oral Daily Wynetta Fines T, MD   5 mg at 08/17/20 1019   citalopram (CELEXA) tablet 20 mg  20 mg Oral Daily Wynetta Fines T, MD   20 mg at 08/17/20 1019   clobetasol ointment (TEMOVATE) [id number] % 1 application  1 application Topical PRN Wynetta Fines T, MD       hydrALAZINE (APRESOLINE) tablet 25 mg  25 mg Oral Q6H PRN Lequita Halt, MD       levothyroxine (SYNTHROID) tablet 50 mcg   50 mcg Oral Q0600 Wynetta Fines T, MD   50 mcg at 08/18/20 0542   magnesium sulfate IVPB 4 g 100 mL  4 g Intravenous Once Elgergawy, Silver Huguenin, MD       metoprolol succinate (TOPROL-XL) 24 hr tablet 25 mg  25 mg Oral Daily Wynetta Fines T, MD   25 mg at 08/17/20 1019   ondansetron (ZOFRAN) injection 4 mg  4 mg Intravenous Q6H PRN Wynetta Fines T, MD   4 mg at 08/16/20 0139   potassium chloride SA (KLOR-CON) CR tablet 40 mEq  40 mEq Oral Q6H Elgergawy, Silver Huguenin, MD       sodium phosphate 30 mmol in dextrose 5 % 250 mL infusion  30 mmol Intravenous Once Elgergawy, Silver Huguenin, MD       tamsulosin (FLOMAX) capsule 0.4 mg  0.4 mg Oral QHS Wynetta Fines T, MD   0.4 mg at 08/17/20 2149      No Known Allergies:  Family History  Problem Relation Age of Onset   Rectal cancer Mother    Colon polyps Neg Hx    Esophageal cancer Neg Hx    Colon cancer Neg Hx    Stomach cancer Neg Hx    :  Social History   Socioeconomic History   Marital status: Married    Spouse name: Not on file   Number of children: Not on file   Years of education: Not on file   Highest education level: Not on file  Occupational History   Not on file  Tobacco Use   Smoking status: Never Smoker   Smokeless tobacco: Never Used  Substance and Sexual Activity   Alcohol use: Yes    Alcohol/week: 2.0 standard drinks    Types: 2 Glasses of wine per week   Drug use: No   Sexual activity: Not on file  Other Topics Concern   Not on file  Social History Narrative   Not on file   Social Determinants of Health   Financial Resource Strain: Not on file  Food Insecurity: Not on file  Transportation Needs: Not on file  Physical Activity: Not on file  Stress: Not on file  Social Connections: Not on file  Intimate Partner Violence: Not on file  :  REVIEW OF SYSTEMS:  A comprehensive 14 point review of systems was negative except as noted in the HPI.    Exam: Patient Vitals for the past 24 hrs:  BP Temp Temp src Pulse Resp SpO2   08/17/20 2050 136/89 99.2 F (37.3 C) Oral 92 16 95 %  08/17/20 1700 128/60 98.4 F (36.9 C) Oral 80 18 96 %  08/17/20 0913 134/62 98.4 F (36.9 C) -- 83 18 94 %    General:  well-nourished in no acute distress.   Eyes:  no scleral  icterus.  Ecchymosis at right thigh. ENT:  There were no oropharyngeal lesions.   Neck: Left neck fullness without discrete adenopathy. Lymphatics:  Negative cervical, supraclavicular or axillary adenopathy.   Respiratory: lungs were clear bilaterally without wheezing or crackles.   Cardiovascular:  Regular rate and rhythm, S1/S2, without murmur, rub or gallop.  1+ bilateral lower extremity edema. GI:  abdomen was soft, flat, nontender, nondistended, without organomegaly.   Musculoskeletal: Strength symmetrical in the upper and lower extremities.   Skin: Ecchymoses to right eye and bilateral arms.   Neuro exam was nonfocal.  Patient was alert and oriented.  Attention was good.   Language was appropriate.  Mood was normal without depression.  Speech was not pressured.  Thought content was not tangential.    LABS:  Lab Results  Component Value Date   WBC 3.7 (L) 08/18/2020   HGB 12.4 (L) 08/18/2020   HCT 37.4 (L) 08/18/2020   PLT 39 (L) 08/18/2020   GLUCOSE 102 (H) 08/18/2020   CHOL 281 (H) 02/19/2020   TRIG 54.0 02/19/2020   HDL 162.50 02/19/2020   LDLCALC 107 (H) 02/19/2020   ALT 40 08/16/2020   AST 44 (H) 08/16/2020   NA 134 (L) 08/18/2020   K 3.3 (L) 08/18/2020   CL 96 (L) 08/18/2020   CREATININE 1.54 (H) 08/18/2020   BUN 17 08/18/2020   CO2 27 08/18/2020   PSA 1.49 02/19/2020   HGBA1C 5.1 02/19/2020    CT ABDOMEN PELVIS WO CONTRAST  Result Date: 08/15/2020 CLINICAL DATA:  Nausea and vomiting for several days EXAM: CT ABDOMEN AND PELVIS WITHOUT CONTRAST TECHNIQUE: Multidetector CT imaging of the abdomen and pelvis was performed following the standard protocol without IV contrast. COMPARISON:  05/30/2014 FINDINGS: Lower chest: Lung bases  demonstrate some very mild scarring bilaterally. Hepatobiliary: Fatty infiltration of the liver is noted. The gallbladder is well distended. Pancreas: Unremarkable. No pancreatic ductal dilatation or surrounding inflammatory changes. Spleen: Normal in size without focal abnormality. Adrenals/Urinary Tract: Adrenal glands are within normal limits. Kidneys demonstrate tiny nonobstructing calculi bilaterally. The ureters are well visualized and within normal limits. Scarring of the left kidney is seen related to prior obstructive changes. Bladder is well distended. Stomach/Bowel: No obstructive or inflammatory changes of the colon are seen. The appendix is within normal limits. No small bowel or gastric abnormality is seen. Vascular/Lymphatic: Aortic atherosclerosis. No enlarged abdominal or pelvic lymph nodes. Reproductive: Prostate is unremarkable. Other: No abdominal wall hernia or abnormality. No abdominopelvic ascites. Musculoskeletal: Degenerative changes of lumbar spine are noted. IMPRESSION: Fatty liver. Tiny nonobstructing renal calculi bilaterally. Left renal scarring stable from the prior study. Aortic Atherosclerosis (ICD10-I70.0). Electronically Signed   By: Inez Catalina M.D.   On: 08/15/2020 15:31   CT HEAD WO CONTRAST  Result Date: 08/16/2020 CLINICAL DATA:  70 year old male status post fall with trace subdural and subarachnoid hemorrhage. EXAM: CT HEAD WITHOUT CONTRAST TECHNIQUE: Contiguous axial images were obtained from the base of the skull through the vertex without intravenous contrast. COMPARISON:  Head CT 08/15/2020. FINDINGS: Brain: Trace (2-3 mm) left side subdural hematoma again suspected on series 5, image 34 and stable since yesterday. Superimposed trace occipital sulci subarachnoid hemorrhage also appears stable (coronal image 55). No other intracranial hemorrhage identified. No intracranial mass effect or midline shift. No ventriculomegaly. Gray-white matter differentiation is stable  and within normal limits for age. No cortically based acute infarct identified. Vascular: Calcified atherosclerosis at the skull base. No suspicious intracranial vascular hyperdensity. Skull: Stable.  No  fracture identified. Sinuses/Orbits: Visualized paranasal sinuses and mastoids are stable and well aerated. Other: Posterior midline scalp hematoma and/or laceration is stable on series 4, image 67. Underlying calvarium appears intact. Otherwise stable and negative orbit and scalp soft tissues. IMPRESSION: 1. Stable trace left side subdural hematoma and trace occipital subarachnoid hemorrhage since yesterday. No intracranial mass effect or midline shift. 2. No new intracranial abnormality. 3. Stable posterior midline scalp hematoma and/or laceration. No skull fracture identified. Electronically Signed   By: Genevie Ann M.D.   On: 08/16/2020 11:51   CT Head Wo Contrast  Result Date: 08/15/2020 CLINICAL DATA:  Head trauma. Fall last night with right eye swelling and redness. EXAM: CT HEAD WITHOUT CONTRAST TECHNIQUE: Contiguous axial images were obtained from the base of the skull through the vertex without intravenous contrast. COMPARISON:  None. FINDINGS: Brain: There is no evidence of an acute infarct, mass, or midline shift. There is trace left parieto-occipital subarachnoid hemorrhage. Trace subdural hemorrhage over the left cerebral convexity measures up to 3 mm in thickness without mass effect. Mild cerebral atrophy is within normal limits for age. Hypodensities in the cerebral white matter bilaterally are nonspecific but compatible with mild chronic small vessel ischemic disease. Vascular: Calcified atherosclerosis at the skull base. No hyperdense vessel. Skull: No fracture or suspicious osseous lesion. Sinuses/Orbits: Orbits more fully evaluated on dedicated orbital CT. Clear paranasal sinuses and mastoid air cells. Other: Small parietal scalp hematoma. IMPRESSION: 1. Trace left parieto-occipital subarachnoid  hemorrhage. 2. Trace subdural hemorrhage over the left cerebral convexity without mass effect. 3. Small parietal scalp hematoma. Critical Value/emergent results were called by telephone at the time of interpretation on 08/15/2020 at 3:58 pm to provider Benedetto Goad, who verbally acknowledged these results. Electronically Signed   By: Logan Bores M.D.   On: 08/15/2020 15:59   DG Chest Port 1 View  Result Date: 08/15/2020 CLINICAL DATA:  Nausea and diarrhea EXAM: PORTABLE CHEST 1 VIEW COMPARISON:  None. FINDINGS: The heart size and mediastinal contours are within normal limits. Both lungs are clear. The visualized skeletal structures are unremarkable. IMPRESSION: No active disease. Electronically Signed   By: Ulyses Jarred M.D.   On: 08/15/2020 20:36   CT OrbitsS W/O CM  Result Date: 08/15/2020 CLINICAL DATA:  Recent fall with right eye pain and swelling, initial encounter EXAM: CT ORBITS WITHOUT CONTRAST TECHNIQUE: Multidetector CT imaging of the orbits was performed using the standard protocol without intravenous contrast. Multiplanar CT image reconstructions were also generated. COMPARISON:  No pertinent prior exam. FINDINGS: Orbits: Orbits and their contents are within normal limits. No postseptal soft tissue edema is seen. Very mild soft tissue swelling over the right eye is noted consistent with the recent injury. Visible paranasal sinuses: Paranasal sinuses are well aerated without focal abnormality. Soft tissues: Surrounding soft tissue structures are otherwise within normal limits aside from the periorbital swelling on the right. Osseous: No acute bony abnormality is seen. Limited intracranial: Limited intracranial views show mild atrophic changes. IMPRESSION: Mild periorbital swelling without intraorbital abnormality. No bony abnormality is noted. Electronically Signed   By: Inez Catalina M.D.   On: 08/15/2020 15:23   US Abdomen Limited RUQ (LIVER/GB)  Result Date: 08/16/2020 CLINICAL DATA:  Liver  function test elevation. EXAM: ULTRASOUND ABDOMEN LIMITED RIGHT UPPER QUADRANT COMPARISON:  CT abdomen pelvis 08/15/2020 FINDINGS: Gallbladder: Sludge noted within the gallbladder lumen. No gallstones or wall thickening visualized. No sonographic Murphy sign noted by sonographer. Common bile duct: Diameter: 6 mm. Liver: No focal  lesion identified. Increased parenchymal echogenicity. Portal vein is patent on color Doppler imaging with normal direction of blood flow towards the liver. Other: None. IMPRESSION: 1. Hepatic steatosis. Please note limited evaluation for focal hepatic masses in a patient with hepatic steatosis due to decreased penetration of the acoustic ultrasound waves. 2. Gallbladder sludge with no findings of acute cholecystitis. Electronically Signed   By: Iven Finn M.D.   On: 08/16/2020 05:24    I have reviewed the peripheral blood smear.  Platelet clumping were noted.  No schistocytes  ASSESSMENT AND PLAN:   Pancytopenia The patient is noted to have mild leukopenia which has been fluctuating for the past 3 to 4 years, macrocytic anemia, and now acute on chronic thrombocytopenia He has no evidence of DIC and no recent use of heparin or low molecular weight heparin Platelet count this morning was 39,000 however, on DIC panel a few hours later was up to 99,000  On my review, numerous sites of platelet clumping is noted and I felt that the true platelet count would be higher Overall, the cause of the pancytopenia is multifactorial He will likely have slight bone marrow suppression from chronic alcohol intake His slight worsening thrombocytopenia could be due to consumption due to recent fall and trauma We have extensive discussions about the importance of alcohol cessation.  He does not need platelet transfusion at this point in time I recommend recheck platelet count again tomorrow If platelet count is greater than 50,000, he can be discharged I will set up outpatient appointment  next month  Subdural hematoma Secondary to fall Subdural hematoma stable on recent CT Neurosurgery following  Acute gastroenteritis GI symptoms have resolved  AKI on CKD stage II Creatinine improved with hydration and cessation of nausea, vomiting, diarrhea Monitor renal function The patient admits he is only drinking approximately 40 ounces of water prior to admission on a regular basis We discussed the importance of adequate hydration intake to avoid worsening renal failure in the future  Hepatic steatosis Noted on imaging LFTs normal at this time however T bili up slightly today Elevated total bilirubin is likely due to recent bruising Monitor We have extensive discussions about the importance of alcohol cessation as well as weight loss and dietary modification  Thank you for this referral.   Mikey Bussing, DNP, AGPCNP-BC, AOCNP Heath Lark, MD

## 2020-08-18 NOTE — Progress Notes (Addendum)
Neurosurgery Service Progress Note  Subjective: No acute events overnight, called by Dr. Waldron Labs due to development of new thrombocytopenia, pt denies any headaches, no N/V   Objective: Vitals:   08/17/20 0433 08/17/20 0913 08/17/20 1700 08/17/20 2050  BP: 133/63 134/62 128/60 136/89  Pulse: 72 83 80 92  Resp: 20 18 18 16   Temp: 98.4 F (36.9 C) 98.4 F (36.9 C) 98.4 F (36.9 C) 99.2 F (37.3 C)  TempSrc: Oral  Oral Oral  SpO2: 93% 94% 96% 95%  Weight:      Height:        Physical Exam: AOx3, PERRL, EOMI, FS, TM, Strength 5/5 x4, SILTx4, no drift  Assessment & Plan: 70 y.o. man s/p syncopal event 2/2 gastroenteritis, striking his head. CTH w/ small tSAH and small aSDH, course c/b thrombocytopenia down to 39.  -unfortunately, I do not know of any useful data on PLT goal in this setting. Would obviously hold anti-coagulant and anti-PLT agents unless the underlying pathology of his thrombocytopenia requires anticoagulation. It has been 3d since his initial CT and repeat CTH showed stable hemorrhage. In the absence of any new trauma, would be unlikely to have expansion of a tSAH/aSDH this far out. I would defer to hematology if they have a preference.  -I think a goal of 25k would be a good balance of avoiding over-transfusion and avoiding hemorrhage, essentially using criteria of a floor of 10k for normal patients and 25k for patients with an elevated risk of hemorrhage. -I explained to the patient that it's somewhat silly advice to tell him not to fall, but to ask for help when getting out of bed / etc and to take it seriously, as a repeat head trauma in the setting of severe thrombocytopenia can be quite serious.  Joyice Faster Joshlyn Beadle  08/18/20 9:21 AM

## 2020-08-18 NOTE — Progress Notes (Signed)
PROGRESS NOTE    Devin Howell  PPI:[id number] DOB: [birth year] DOA: 08/15/2020 PCP: Pleas Koch, NP    Chief Complaint  Patient presents with  . Nausea  . Diarrhea    Brief Narrative:    Devin Howell is a 70 y.o. male with medical history significant of CKD stage 2, HTN, HLD, hypothyroidism, BPH, anxiety/depression, gout, presented with persistent nausea vomiting diarrhea.  Patient traveled to [place] and [place] area for business trip for 2 total of two nights with his wife and came back early this week and then developed nauseous vomit and diarrhea.  Nonbloody nonbilious.  Able to tolerate only small amount of Pasta, and able to drink only small sips of water, no abd pain.  Diarrhea has been watery, 3-5 times a day.  Denies any fever chills and wife does not have any GI symtpoms.  He has been feeling so dehydrated for the last 2 days, this morning he got up to go to the bathroom however, suddenly felt lightheaded and fell down hit right eye and right-sided forehead against wall and fell down. No LOC. ED Course: Blood work found AKI creatinine 2.9 compared to baseline 1.5-2.  CT head showed small left parietal-occipital arachnoid hemorrhage, trace subdural hematoma over the left cerebral convexity without mass-effect.  Assessment & Plan:   Active Problems:   AKI (acute kidney injury) ([place])   Acute gastroenteritis -Patient presents with significant nausea and vomiting, this is most likely due to gastroenteritis, likely viral in etiology, acute GI findings CT abdomen and pelvis. -Diarrhea has improved, he is currently off isolation, no further diarrhea for stool panel -Liquid intake is significantly improved, even though solid intake remains poor, will DC IV fluids specially has developed some lower extremity edema -Continue with as needed Zofran  AKI on CKD stage II -Severely dehydrated, appetite remains very poor given significant nausea, baseline  creatinine 1.5, peaked at 3.2, mildly improving but significantly elevated today at 1.99 -Avoid nephrotoxic medications -Stopped his IV fluid giving some lower extremity edema  Occipital- parietal subarachnoid hemorrhage/subdural hematoma -Neuro exam benign -Neurosurgery consulted, not a surgical candidate, no further work-up -Stable repeat CT head this morning  Near syncope -From dehydration  Elevated LFT -Likely due to fatty liver, negative hepatitis panel  BPH with urinary retention on CAT scan -Continue Flomax -He denies any further retention  Hypothyroidism -Continue Synthroid. -TSH is mildly elevated, but free T4 within normal range, so we will hold on changing his dose.  Thrombocytopenia -Appears to be chronic, this is most likely due to fatty liver -Morning platelet count significantly low at 39K, but repeat at 99K, hematology  consulted regarding further recommendation  Hypokalemia/hypophosphatemia/hypomagnesemia -Significantly low, was repleted yesterday, it remains low this morning as well.   DVT prophylaxis: SCD, no chemical prophylaxis due to subarachnoid hemorrhage Code Status: Full Family Communication: None at bedside Disposition:   Status is: Inpatient  Remains inpatient appropriate because:Ongoing diagnostic testing needed not appropriate for outpatient work up and IV treatments appropriate due to intensity of illness or inability to take PO   Dispo: The patient is from: Home              Anticipated d/c is to: Home              Patient currently is not medically stable to d/c.   Difficult to place patient No       Consultants:   Neurosurgery  Hematology   Subjective:  Reports he  is  feeling better today, no nausea, no vomiting, no diarrhea, no dizziness or lightheadedness..     Objective: Vitals:   08/17/20 0913 08/17/20 1700 08/17/20 2050 08/18/20 1039  BP: 134/62 128/60 136/89 (!) 146/73  Pulse: 83 80 92 91  Resp: 18 18 16     Temp: 98.4 F (36.9 C) 98.4 F (36.9 C) 99.2 F (37.3 C)   TempSrc:  Oral Oral   SpO2: 94% 96% 95%   Weight:      Height:        Intake/Output Summary (Last 24 hours) at 08/18/2020 1327 Last data filed at 08/18/2020 0900 Gross per 24 hour  Intake 1290 ml  Output 300 ml  Net 990 ml   Filed Weights   08/15/20 1350  Weight: 104.3 kg    Examination:  Awake Alert, Oriented X 3, No new F.N deficits, Normal affect, patient with right thigh periorbital bruising, and left posterior scalp bruise (no open wound) Symmetrical Chest wall movement, Good air movement bilaterally, CTAB RRR,No Gallops,Rubs or new Murmurs, No Parasternal Heave +ve B.Sounds, Abd Soft, No tenderness, No rebound - guarding or rigidity. No Cyanosis, Clubbing ,trace edema, No new Rash or bruise        Data Reviewed: I have personally reviewed following labs and imaging studies  CBC: Recent Labs  Lab 08/15/20 1349 08/15/20 1411 08/16/20 0354 08/17/20 0148 08/18/20 0446 08/18/20 1011  WBC 5.8  --  4.8 3.7* 3.7*  --   NEUTROABS 4.1  --   --   --   --   --   HGB 13.5 13.3 12.1* 11.0* 12.4*  --   HCT 38.8* 39.0 35.1* 32.8* 37.4*  --   MCV 102.9*  --  103.2* 106.1* 108.4*  --   PLT 102*  --  82* 80* 39* 99*    Basic Metabolic Panel: Recent Labs  Lab 08/15/20 1349 08/15/20 1411 08/16/20 0354 08/17/20 0148 08/18/20 0446  NA 133* 132* 138 138 134*  K 3.6 3.6 3.5 3.2* 3.3*  CL 88* 93* 94* 97* 96*  CO2 23  --  24 28 27   GLUCOSE 79 81 86 105* 102*  BUN 41* 39* 37* 28* 17  CREATININE 2.95* 3.20* 2.73* 1.99* 1.54*  CALCIUM 9.0  --  8.4* 8.3* 8.1*  MG  --   --   --  1.4* 1.6*  PHOS  --   --   --  2.1* 2.1*    GFR: Estimated Creatinine Clearance: 51.5 mL/min (A) (by C-G formula based on SCr of 1.54 mg/dL (H)).  Liver Function Tests: Recent Labs  Lab 08/15/20 1349 08/16/20 0354 08/18/20 1011  AST 58* 44* 38  ALT 48* 40 34  ALKPHOS 83 69 75  BILITOT 1.7* 2.7* 2.6*  PROT 6.8 6.1* 6.7   ALBUMIN 3.9 3.3* 3.5    CBG: Recent Labs  Lab 08/15/20 1610  GLUCAP 84     Recent Results (from the past 240 hour(s))  Resp Panel by RT-PCR (Flu A&B, Covid) Nasopharyngeal Swab     Status: None   Collection Time: 08/15/20  4:13 PM   Specimen: Nasopharyngeal Swab; Nasopharyngeal(NP) swabs in vial transport medium  Result Value Ref Range Status   SARS Coronavirus 2 by RT PCR NEGATIVE NEGATIVE Final    Comment: (NOTE) SARS-CoV-2 target nucleic acids are NOT DETECTED.  The SARS-CoV-2 RNA is generally detectable in upper respiratory specimens during the acute phase of infection. The lowest concentration of SARS-CoV-2 viral copies this assay can detect is 138 copies/mL.  A negative result does not preclude SARS-Cov-2 infection and should not be used as the sole basis for treatment or other patient management decisions. A negative result may occur with  improper specimen collection/handling, submission of specimen other than nasopharyngeal swab, presence of viral mutation(s) within the areas targeted by this assay, and inadequate number of viral copies(<138 copies/mL). A negative result must be combined with clinical observations, patient history, and epidemiological information. The expected result is Negative.  Fact Sheet for Patients:  EntrepreneurPulse.com.au  Fact Sheet for Healthcare Providers:  IncredibleEmployment.be  This test is no t yet approved or cleared by the [place] FDA and  has been authorized for detection and/or diagnosis of SARS-CoV-2 by FDA under an Emergency Use Authorization (EUA). This EUA will remain  in effect (meaning this test can be used) for the duration of the COVID-19 declaration under Section 564(b)(1) of the Act, 21 U.S.C.section 360bbb-3(b)(1), unless the authorization is terminated  or revoked sooner.       Influenza A by PCR NEGATIVE NEGATIVE Final   Influenza B by PCR NEGATIVE NEGATIVE Final     Comment: (NOTE) The Xpert Xpress SARS-CoV-2/FLU/RSV plus assay is intended as an aid in the diagnosis of influenza from Nasopharyngeal swab specimens and should not be used as a sole basis for treatment. Nasal washings and aspirates are unacceptable for Xpert Xpress SARS-CoV-2/FLU/RSV testing.  Fact Sheet for Patients: EntrepreneurPulse.com.au  Fact Sheet for Healthcare Providers: IncredibleEmployment.be  This test is not yet approved or cleared by the [place] FDA and has been authorized for detection and/or diagnosis of SARS-CoV-2 by FDA under an Emergency Use Authorization (EUA). This EUA will remain in effect (meaning this test can be used) for the duration of the COVID-19 declaration under Section 564(b)(1) of the Act, 21 U.S.C. section 360bbb-3(b)(1), unless the authorization is terminated or revoked.  Performed at [facility], [place] [address] [place], [place] [address]          Radiology Studies: No results found.      Scheduled Meds: . acidophilus  2 capsule Oral TID  . allopurinol  300 mg Oral Daily  . amLODipine  5 mg Oral Daily  . citalopram  20 mg Oral Daily  . levothyroxine  50 mcg Oral Q0600  . metoprolol succinate  25 mg Oral Daily  . potassium chloride  40 mEq Oral Q6H  . tamsulosin  0.4 mg Oral QHS   Continuous Infusions: . sodium phosphate  Dextrose 5% IVPB 30 mmol (08/18/20 1045)     LOS: 3 days      Phillips Climes, MD [facility]   To contact the attending provider between 7A-7P or the covering provider during after hours 7P-7A, please log into the web site [email or web address] and access using universal [place] password for that web site. If you do not have the password, please call the hospital operator.  08/18/2020, 1:27 PM

## 2020-08-18 NOTE — Plan of Care (Signed)
  Problem: Nutritional: Goal: Ability to make appropriate dietary choices will improve Outcome: Completed/Met   Problem: Respiratory: Goal: Respiratory symptoms related to disease process will be avoided Outcome: Completed/Met   Problem: Health Behavior/Discharge Planning: Goal: Ability to manage health-related needs will improve Outcome: Completed/Met   Problem: Clinical Measurements: Goal: Complications related to the disease process or treatment will be avoided or minimized Outcome: Completed/Met   Problem: Activity: Goal: Activity intolerance will improve Outcome: Completed/Met   Problem: Fluid Volume: Goal: Fluid volume balance will be maintained or improved Outcome: Completed/Met   Problem: Nutritional: Goal: Ability to make appropriate dietary choices will improve Outcome: Completed/Met   Problem: Respiratory: Goal: Respiratory symptoms related to disease process will be avoided Outcome: Completed/Met   Problem: Urinary Elimination: Goal: Progression of disease will be identified and treated Outcome: Completed/Met

## 2020-08-18 NOTE — Progress Notes (Signed)
Physical Therapy Treatment Patient Details Name: Devin Howell MRN: [id number] DOB: [birth year] Today's Date: 08/18/2020    History of Present Illness The pt is a 70 yo male presenting 5/20 due to 1.5-2 weeks of nausea, vomiting, and diarrhea. Pt also reporting he fell when getting up to go to the bathroom overnight prior to admission, noted to have bruise on R eye upon admission. Imaging revealed trace SAH of L parietal and occipital lobes with associated SAH. Pt also found to have AKI. PMH includes: gout, CKD, HLD, HTN, and sleep apnea (uses cpap).    PT Comments    Pt is making good progress towards his goals today. He continues to exhibit some impulsetivity with ambulation requiring cuing for safety especially with IV pole and line. Pt was up in bathroom on entry, agreeable to walking with therapy, but states he was already walking in hallway this morning. Pt started ambulation pushing IV pole with min guard and was able to progress to ambulation without UE support. Pt given DGI balance test and scored 22/24 placing him at reduced risk for falls. Given pt's progress PT currently does not feel pt will need follow up PT services, however PT will continue to follow acutely for progression of mobility.   Follow Up Recommendations  Supervision for mobility/OOB;No PT follow up     Equipment Recommendations    None recoommended      Precautions / Restrictions Precautions Precautions: Fall Precaution Comments: admitted for fall, concern for orhtostatic hypotension Restrictions Weight Bearing Restrictions: No    Mobility      Transfers                 General transfer comment: OOB walking to bathroom on entry  Ambulation/Gait Ambulation/Gait assistance: Min guard Gait Distance (Feet): 300 Feet Assistive device: IV Pole;None Gait Pattern/deviations: Step-through pattern;WFL(Within Functional Limits) Gait velocity: sometimes too fast for conditions Gait velocity  interpretation: 1.31 - 2.62 ft/sec, indicative of limited community ambulator General Gait Details: requires increased cuing for safety with IV pole and line, slightly impulsive with movement   Stairs   Stairs assistance: Min guard Stair Management: One rail Right;Step to pattern;Forwards Number of Stairs: 1 General stair comments: step-to pattern with minA through HHA.         Balance Overall balance assessment: Needs assistance Sitting-balance support: No upper extremity supported Sitting balance-Leahy Scale: Good Sitting balance - Comments: able to reach to don socks without assist     Standing balance-Leahy Scale: Good                   Standardized Balance Assessment Standardized Balance Assessment : Dynamic Gait Index   Dynamic Gait Index Level Surface: Normal Change in Gait Speed: Normal Gait with Horizontal Head Turns: Normal Gait with Vertical Head Turns: Mild Impairment Gait and Pivot Turn: Normal Step Over Obstacle: Normal Step Around Obstacles: Normal Steps: Mild Impairment Total Score: 22      Cognition Arousal/Alertness: Awake/alert Behavior During Therapy: Flat affect Overall Cognitive Status: Impaired/Different from baseline Area of Impairment: Following commands;Safety/judgement;Problem solving                       Following Commands: Follows one step commands consistently (repeated cues) Safety/Judgement: Decreased awareness of safety;Decreased awareness of deficits   Problem Solving: Difficulty sequencing;Requires verbal cues General Comments: Pt answers questions appropriately however does not elaborate needing additional questioning for clarification, pt continues to lack safety awareness, requiring increased cues for slowing down,  General Comments General comments (skin integrity, edema, etc.): VSS on RA, wife in room reports he is not eating well      Pertinent Vitals/Pain Pain Assessment: No/denies pain            PT Goals (current goals can now be found in the care plan section) Acute Rehab PT Goals Patient Stated Goal: get back to walking 5 miles/day PT Goal Formulation: With patient Time For Goal Achievement: 08/30/20 Potential to Achieve Goals: Fair Progress towards PT goals: Progressing toward goals    Frequency    Min 3X/week      PT Plan Discharge plan needs to be updated;Equipment recommendations need to be updated       AM-PAC PT "6 Clicks" Mobility   Outcome Measure  Help needed turning from your back to your side while in a flat bed without using bedrails?: None Help needed moving from lying on your back to sitting on the side of a flat bed without using bedrails?: None Help needed moving to and from a bed to a chair (including a wheelchair)?: None Help needed standing up from a chair using your arms (e.g., wheelchair or bedside chair)?: None Help needed to walk in hospital room?: None Help needed climbing 3-5 steps with a railing? : A Little 6 Click Score: 23    End of Session Equipment Utilized During Treatment: Gait belt Activity Tolerance: Patient tolerated treatment well Patient left: in chair;with call bell/phone within reach;with family/visitor present Nurse Communication: Mobility status PT Visit Diagnosis: Other abnormalities of gait and mobility (R26.89)     Time: 2426-8341 PT Time Calculation (min) (ACUTE ONLY): 18 min  Charges:  $Physical Performance Test: 8-22 mins                     Minetta Krisher B. Migdalia Dk PT, DPT Acute Rehabilitation Services Pager [phone] Office [phone]    [place] 08/18/2020, 12:50 PM

## 2020-08-18 NOTE — Care Management Important Message (Signed)
Important Message  Patient Details  Name: Devin Howell MRN: [id number] Date of Birth: May 18, 1950   Medicare Important Message Given:  Yes     Carlie Corpus P [place] 08/18/2020, 2:12 PM

## 2020-08-19 ENCOUNTER — Telehealth: Payer: Self-pay

## 2020-08-19 DIAGNOSIS — S065X9A Traumatic subdural hemorrhage with loss of consciousness of unspecified duration, initial encounter: Secondary | ICD-10-CM

## 2020-08-19 LAB — COMPREHENSIVE METABOLIC PANEL
ALT: 30 U/L (ref 0–44)
AST: 34 U/L (ref 15–41)
Albumin: 3.4 g/dL — ABNORMAL LOW (ref 3.5–5.0)
Alkaline Phosphatase: 68 U/L (ref 38–126)
Anion gap: 12 (ref 5–15)
BUN: 14 mg/dL (ref 8–23)
CO2: 25 mmol/L (ref 22–32)
Calcium: 8.7 mg/dL — ABNORMAL LOW (ref 8.9–10.3)
Chloride: 98 mmol/L (ref 98–111)
Creatinine, Ser: 1.39 mg/dL — ABNORMAL HIGH (ref 0.61–1.24)
GFR, Estimated: 54 mL/min — ABNORMAL LOW (ref >60)
Glucose, Bld: 93 mg/dL (ref 70–99)
Potassium: 3.9 mmol/L (ref 3.5–5.1)
Sodium: 135 mmol/L (ref 135–145)
Total Bilirubin: 2.2 mg/dL — ABNORMAL HIGH (ref 0.3–1.2)
Total Protein: 6.7 g/dL (ref 6.5–8.1)

## 2020-08-19 LAB — CBC
HCT: 38.4 % — ABNORMAL LOW (ref 39.0–52.0)
Hemoglobin: 12.8 g/dL — ABNORMAL LOW (ref 13.0–17.0)
MCH: 35.2 pg — ABNORMAL HIGH (ref 26.0–34.0)
MCHC: 33.3 g/dL (ref 30.0–36.0)
MCV: 105.5 fL — ABNORMAL HIGH (ref 80.0–100.0)
Platelets: 109 10*3/uL — ABNORMAL LOW (ref 150–400)
RBC: 3.64 MIL/uL — ABNORMAL LOW (ref 4.22–5.81)
RDW: 14 % (ref 11.5–15.5)
WBC: 4.1 10*3/uL (ref 4.0–10.5)
nRBC: 0 % (ref 0.0–0.2)

## 2020-08-19 LAB — PROTIME-INR
INR: 1 (ref 0.8–1.2)
Prothrombin Time: 12.8 seconds (ref 11.4–15.2)

## 2020-08-19 NOTE — Discharge Instructions (Signed)
Follow with Primary MD Pleas Koch, NP in 7 days   Get CBC, CMP, checked  by Primary MD next visit.    Activity: As tolerated with Full fall precautions use walker/cane & assistance as needed   Disposition Home    Diet: Heart Healthy .    On your next visit with your primary care physician please Get Medicines reviewed and adjusted.   Please request your Prim.MD to go over all Hospital Tests and Procedure/Radiological results at the follow up, please get all Hospital records sent to your Prim MD by signing hospital release before you go home.   If you experience worsening of your admission symptoms, develop shortness of breath, life threatening emergency, suicidal or homicidal thoughts you must seek medical attention immediately by calling 911 or calling your MD immediately  if symptoms less severe.  You Must read complete instructions/literature along with all the possible adverse reactions/side effects for all the Medicines you take and that have been prescribed to you. Take any new Medicines after you have completely understood and accpet all the possible adverse reactions/side effects.   Do not drive, operating heavy machinery, perform activities at heights, swimming or participation in water activities or provide baby sitting services if your were admitted for syncope or siezures until you have seen by Primary MD or a Neurologist and advised to do so again.  Do not drive when taking Pain medications.    Do not take more than prescribed Pain, Sleep and Anxiety Medications  Special Instructions: If you have smoked or chewed Tobacco  in the last 2 yrs please stop smoking, stop any regular Alcohol  and or any Recreational drug use.  Wear Seat belts while driving.   Please note  You were cared for by a hospitalist during your hospital stay. If you have any questions about your discharge medications or the care you received while you were in the hospital after you are  discharged, you can call the unit and asked to speak with the hospitalist on call if the hospitalist that took care of you is not available. Once you are discharged, your primary care physician will handle any further medical issues. Please note that NO REFILLS for any discharge medications will be authorized once you are discharged, as it is imperative that you return to your primary care physician (or establish a relationship with a primary care physician if you do not have one) for your aftercare needs so that they can reassess your need for medications and monitor your lab values.

## 2020-08-19 NOTE — Discharge Summary (Signed)
Physician Discharge Summary  Devin Howell UKG:[id number] DOB: [birth year] DOA: 08/15/2020  PCP: Pleas Koch, NP  Admit date: 08/15/2020 Discharge date: 08/19/2020  Admitted From: Home Disposition:  Home      Recommendations for Outpatient Follow-up:  1. Follow up with PCP in 1-2 weeks 2. Please obtain BMP/CBC in one week 3. Please follow up on the following pending results:  Home Health: NO Equipment/Devices: None  Discharge Condition:Stable CODE STATUS:FULL Diet recommendation: Heart Healthy   Brief/Interim Summary:   Acute gastroenteritis -Patient presents with significant nausea and vomiting, this is most likely due to gastroenteritis, likely viral in etiology, no acute GI findings CT abdomen and pelvis. - Diarrhea has resolved d no nausea, no vomiting, no abdominal pain, good oral intake and appetite   AKI on CKD stage II -Severely dehydrated, with poor appetite, and diarrhea and volume depletio, baseline creatinine 1.5, peaked at 3.2, difficultly improved, it is 1.39 on discharge  Occipital-parietal subarachnoid hemorrhage/subdural hematoma -Neuro exam benign -Neurosurgery consulted, not a surgical candidate, no further work-up -Stable repeat CT head 24 hours from admission - Discontinued trazodone and Cymbalta upon discharge to minimize risk of falling.  Near syncope -From dehydration  Elevated LFT -Likely due to fatty liver, negative hepatitis panel  BPH withurinary retention on CAT scan -Continue Flomax -He denies any further retention  Hypothyroidism -Continue Synthroid. -TSH is mildly elevated, but free T4 within normal range, so we will hold on changing his dose.  Thrombocytopenia -Appears to be chronic, this is most likely due to fatty liver -5/23 platelet count significantly low at 39K, but repeat at 99K, this am is 109K, hematology  consult appreciated   Hypokalemia/hypophosphatemia/hypomagnesemia -repleted   Discharge  Diagnoses:  Active Problems:   AKI (acute kidney injury) [facility])    Discharge Instructions  Discharge Instructions    Diet - low sodium heart healthy   Complete by: As directed    Discharge instructions   Complete by: As directed    Follow with Primary MD Pleas Koch, NP in 7 days   Get CBC, CMP, checked  by Primary MD next visit.    Activity: As tolerated with Full fall precautions use walker/cane & assistance as needed   Disposition Home    Diet: Heart Healthy .    On your next visit with your primary care physician please Get Medicines reviewed and adjusted.   Please request your Prim.MD to go over all Hospital Tests and Procedure/Radiological results at the follow up, please get all Hospital records sent to your Prim MD by signing hospital release before you go home.   If you experience worsening of your admission symptoms, develop shortness of breath, life threatening emergency, suicidal or homicidal thoughts you must seek medical attention immediately by calling 911 or calling your MD immediately  if symptoms less severe.  You Must read complete instructions/literature along with all the possible adverse reactions/side effects for all the Medicines you take and that have been prescribed to you. Take any new Medicines after you have completely understood and accpet all the possible adverse reactions/side effects.   Do not drive, operating heavy machinery, perform activities at heights, swimming or participation in water activities or provide baby sitting services if your were admitted for syncope or siezures until you have seen by Primary MD or a Neurologist and advised to do so again.  Do not drive when taking Pain medications.    Do not take more than prescribed Pain, Sleep and Anxiety Medications  Special  Instructions: If you have smoked or chewed Tobacco  in the last 2 yrs please stop smoking, stop any regular Alcohol  and or any Recreational drug  use.  Wear Seat belts while driving.   Please note  You were cared for by a hospitalist during your hospital stay. If you have any questions about your discharge medications or the care you received while you were in the hospital after you are discharged, you can call the unit and asked to speak with the hospitalist on call if the hospitalist that took care of you is not available. Once you are discharged, your primary care physician will handle any further medical issues. Please note that NO REFILLS for any discharge medications will be authorized once you are discharged, as it is imperative that you return to your primary care physician (or establish a relationship with a primary care physician if you do not have one) for your aftercare needs so that they can reassess your need for medications and monitor your lab values.   Increase activity slowly   Complete by: As directed    No wound care   Complete by: As directed      Allergies as of 08/19/2020   No Known Allergies     Medication List    STOP taking these medications   DULoxetine 60 MG capsule Commonly known as: CYMBALTA   traZODone 50 MG tablet Commonly known as: DESYREL     TAKE these medications   allopurinol 300 MG tablet Commonly known as: ZYLOPRIM TAKE 1 TABLET (300 MG TOTAL) BY MOUTH DAILY. FOR GOUT PREVENTION.   amLODipine 5 MG tablet Commonly known as: NORVASC Take 5 mg by mouth daily.   atorvastatin 20 MG tablet Commonly known as: LIPITOR Take 1 tablet (20 mg total) by mouth daily. For cholesterol.   citalopram 20 MG tablet Commonly known as: CELEXA TAKE 1 TABLET (20 MG TOTAL) BY MOUTH DAILY. FOR ANXIETY AND DEPRESSION.   levothyroxine 50 MCG tablet Commonly known as: SYNTHROID Take 1 tablet by mouth every morning on an empty stomach with water only.  No food or other medications for 30 minutes. What changed:   how much to take  how to take this  when to take this   metoprolol succinate 25 MG 24  hr tablet Commonly known as: TOPROL-XL Take 25 mg by mouth daily.   tamsulosin 0.4 MG Caps capsule Commonly known as: FLOMAX Take 0.4 mg by mouth at bedtime.   testosterone cypionate 200 MG/ML injection Commonly known as: DEPOTESTOSTERONE CYPIONATE Inject 100 mg into the muscle every 28 (twenty-eight) days.            Durable Medical Equipment  (From admission, onward)         Start     Ordered   08/17/20 1005  For home use only DME Walker rolling  Once       Question Answer Comment  Walker: With 5 Inch Wheels   Patient needs a walker to treat with the following condition Weakness      08/17/20 1005          Follow-up Information    Pleas Koch, NP Follow up in 1 week(s).   Specialty: Internal Medicine Contact information: [place] [address] [phone]              No Known Allergies  Consultations:  Neurosurgery  hematology   Procedures/Studies: CT ABDOMEN PELVIS WO CONTRAST  Result Date: 08/15/2020 CLINICAL  DATA:  Nausea and vomiting for several days EXAM: CT ABDOMEN AND PELVIS WITHOUT CONTRAST TECHNIQUE: Multidetector CT imaging of the abdomen and pelvis was performed following the standard protocol without IV contrast. COMPARISON:  05/30/2014 FINDINGS: Lower chest: Lung bases demonstrate some very mild scarring bilaterally. Hepatobiliary: Fatty infiltration of the liver is noted. The gallbladder is well distended. Pancreas: Unremarkable. No pancreatic ductal dilatation or surrounding inflammatory changes. Spleen: Normal in size without focal abnormality. Adrenals/Urinary Tract: Adrenal glands are within normal limits. Kidneys demonstrate tiny nonobstructing calculi bilaterally. The ureters are well visualized and within normal limits. Scarring of the left kidney is seen related to prior obstructive changes. Bladder is well distended. Stomach/Bowel: No obstructive or inflammatory changes of the colon are seen. The appendix is  within normal limits. No small bowel or gastric abnormality is seen. Vascular/Lymphatic: Aortic atherosclerosis. No enlarged abdominal or pelvic lymph nodes. Reproductive: Prostate is unremarkable. Other: No abdominal wall hernia or abnormality. No abdominopelvic ascites. Musculoskeletal: Degenerative changes of lumbar spine are noted. IMPRESSION: Fatty liver. Tiny nonobstructing renal calculi bilaterally. Left renal scarring stable from the prior study. Aortic Atherosclerosis (ICD10-I70.0). Electronically Signed   By: Inez Catalina M.D.   On: 08/15/2020 15:31   CT HEAD WO CONTRAST  Result Date: 08/16/2020 CLINICAL DATA:  70 year old male status post fall with trace subdural and subarachnoid hemorrhage. EXAM: CT HEAD WITHOUT CONTRAST TECHNIQUE: Contiguous axial images were obtained from the base of the skull through the vertex without intravenous contrast. COMPARISON:  Head CT 08/15/2020. FINDINGS: Brain: Trace (2-3 mm) left side subdural hematoma again suspected on series 5, image 34 and stable since yesterday. Superimposed trace occipital sulci subarachnoid hemorrhage also appears stable (coronal image 55). No other intracranial hemorrhage identified. No intracranial mass effect or midline shift. No ventriculomegaly. Gray-white matter differentiation is stable and within normal limits for age. No cortically based acute infarct identified. Vascular: Calcified atherosclerosis at the skull base. No suspicious intracranial vascular hyperdensity. Skull: Stable.  No fracture identified. Sinuses/Orbits: Visualized paranasal sinuses and mastoids are stable and well aerated. Other: Posterior midline scalp hematoma and/or laceration is stable on series 4, image 67. Underlying calvarium appears intact. Otherwise stable and negative orbit and scalp soft tissues. IMPRESSION: 1. Stable trace left side subdural hematoma and trace occipital subarachnoid hemorrhage since yesterday. No intracranial mass effect or midline shift.  2. No new intracranial abnormality. 3. Stable posterior midline scalp hematoma and/or laceration. No skull fracture identified. Electronically Signed   By: Genevie Ann M.D.   On: 08/16/2020 11:51   CT Head Wo Contrast  Result Date: 08/15/2020 CLINICAL DATA:  Head trauma. Fall last night with right eye swelling and redness. EXAM: CT HEAD WITHOUT CONTRAST TECHNIQUE: Contiguous axial images were obtained from the base of the skull through the vertex without intravenous contrast. COMPARISON:  None. FINDINGS: Brain: There is no evidence of an acute infarct, mass, or midline shift. There is trace left parieto-occipital subarachnoid hemorrhage. Trace subdural hemorrhage over the left cerebral convexity measures up to 3 mm in thickness without mass effect. Mild cerebral atrophy is within normal limits for age. Hypodensities in the cerebral white matter bilaterally are nonspecific but compatible with mild chronic small vessel ischemic disease. Vascular: Calcified atherosclerosis at the skull base. No hyperdense vessel. Skull: No fracture or suspicious osseous lesion. Sinuses/Orbits: Orbits more fully evaluated on dedicated orbital CT. Clear paranasal sinuses and mastoid air cells. Other: Small parietal scalp hematoma. IMPRESSION: 1. Trace left parieto-occipital subarachnoid hemorrhage. 2. Trace subdural hemorrhage over the left  cerebral convexity without mass effect. 3. Small parietal scalp hematoma. Critical Value/emergent results were called by telephone at the time of interpretation on 08/15/2020 at 3:58 pm to provider Benedetto Goad, who verbally acknowledged these results. Electronically Signed   By: Logan Bores M.D.   On: 08/15/2020 15:59   DG Chest Port 1 View  Result Date: 08/15/2020 CLINICAL DATA:  Nausea and diarrhea EXAM: PORTABLE CHEST 1 VIEW COMPARISON:  None. FINDINGS: The heart size and mediastinal contours are within normal limits. Both lungs are clear. The visualized skeletal structures are unremarkable.  IMPRESSION: No active disease. Electronically Signed   By: Ulyses Jarred M.D.   On: 08/15/2020 20:36   CT OrbitsS W/O CM  Result Date: 08/15/2020 CLINICAL DATA:  Recent fall with right eye pain and swelling, initial encounter EXAM: CT ORBITS WITHOUT CONTRAST TECHNIQUE: Multidetector CT imaging of the orbits was performed using the standard protocol without intravenous contrast. Multiplanar CT image reconstructions were also generated. COMPARISON:  No pertinent prior exam. FINDINGS: Orbits: Orbits and their contents are within normal limits. No postseptal soft tissue edema is seen. Very mild soft tissue swelling over the right eye is noted consistent with the recent injury. Visible paranasal sinuses: Paranasal sinuses are well aerated without focal abnormality. Soft tissues: Surrounding soft tissue structures are otherwise within normal limits aside from the periorbital swelling on the right. Osseous: No acute bony abnormality is seen. Limited intracranial: Limited intracranial views show mild atrophic changes. IMPRESSION: Mild periorbital swelling without intraorbital abnormality. No bony abnormality is noted. Electronically Signed   By: Inez Catalina M.D.   On: 08/15/2020 15:23   US Abdomen Limited RUQ (LIVER/GB)  Result Date: 08/16/2020 CLINICAL DATA:  Liver function test elevation. EXAM: ULTRASOUND ABDOMEN LIMITED RIGHT UPPER QUADRANT COMPARISON:  CT abdomen pelvis 08/15/2020 FINDINGS: Gallbladder: Sludge noted within the gallbladder lumen. No gallstones or wall thickening visualized. No sonographic Murphy sign noted by sonographer. Common bile duct: Diameter: 6 mm. Liver: No focal lesion identified. Increased parenchymal echogenicity. Portal vein is patent on color Doppler imaging with normal direction of blood flow towards the liver. Other: None. IMPRESSION: 1. Hepatic steatosis. Please note limited evaluation for focal hepatic masses in a patient with hepatic steatosis due to decreased penetration of  the acoustic ultrasound waves. 2. Gallbladder sludge with no findings of acute cholecystitis. Electronically Signed   By: Iven Finn M.D.   On: 08/16/2020 05:24      Subjective: She denies any nausea, vomiting, no diarrhea, no dizziness or lightheadedness, he is eager to go home today  Discharge Exam: Vitals:   08/19/20 0619 08/19/20 1012  BP: (!) 145/80 (!) 146/81  Pulse: 86 80  Resp: 18 18  Temp: 99.1 F (37.3 C) 98.8 F (37.1 C)  SpO2: 100% 96%   Vitals:   08/18/20 1742 08/18/20 2159 08/19/20 0619 08/19/20 1012  BP: (!) 142/76 (!) 146/73 (!) 145/80 (!) 146/81  Pulse: 89 91 86 80  Resp: 18 18 18 18   Temp: 99.1 F (37.3 C) 99.1 F (37.3 C) 99.1 F (37.3 C) 98.8 F (37.1 C)  TempSrc: Oral Oral Oral Oral  SpO2:  98% 100% 96%  Weight:      Height:        General: Pt is alert, awake, not in acute distress, right periorbital bruise Cardiovascular: RRR, S1/S2 +, no rubs, no gallops Respiratory: CTA bilaterally, no wheezing, no rhonchi Abdominal: Soft, NT, ND, bowel sounds + Extremities: no edema, no cyanosis    The results of  significant diagnostics from this hospitalization (including imaging, microbiology, ancillary and laboratory) are listed below for reference.     Microbiology: Recent Results (from the past 240 hour(s))  Resp Panel by RT-PCR (Flu A&B, Covid) Nasopharyngeal Swab     Status: None   Collection Time: 08/15/20  4:13 PM   Specimen: Nasopharyngeal Swab; Nasopharyngeal(NP) swabs in vial transport medium  Result Value Ref Range Status   SARS Coronavirus 2 by RT PCR NEGATIVE NEGATIVE Final    Comment: (NOTE) SARS-CoV-2 target nucleic acids are NOT DETECTED.  The SARS-CoV-2 RNA is generally detectable in upper respiratory specimens during the acute phase of infection. The lowest concentration of SARS-CoV-2 viral copies this assay can detect is 138 copies/mL. A negative result does not preclude SARS-Cov-2 infection and should not be used as the  sole basis for treatment or other patient management decisions. A negative result may occur with  improper specimen collection/handling, submission of specimen other than nasopharyngeal swab, presence of viral mutation(s) within the areas targeted by this assay, and inadequate number of viral copies(<138 copies/mL). A negative result must be combined with clinical observations, patient history, and epidemiological information. The expected result is Negative.  Fact Sheet for Patients:  EntrepreneurPulse.com.au  Fact Sheet for Healthcare Providers:  IncredibleEmployment.be  This test is no t yet approved or cleared by the [place] FDA and  has been authorized for detection and/or diagnosis of SARS-CoV-2 by FDA under an Emergency Use Authorization (EUA). This EUA will remain  in effect (meaning this test can be used) for the duration of the COVID-19 declaration under Section 564(b)(1) of the Act, 21 U.S.C.section 360bbb-3(b)(1), unless the authorization is terminated  or revoked sooner.       Influenza A by PCR NEGATIVE NEGATIVE Final   Influenza B by PCR NEGATIVE NEGATIVE Final    Comment: (NOTE) The Xpert Xpress SARS-CoV-2/FLU/RSV plus assay is intended as an aid in the diagnosis of influenza from Nasopharyngeal swab specimens and should not be used as a sole basis for treatment. Nasal washings and aspirates are unacceptable for Xpert Xpress SARS-CoV-2/FLU/RSV testing.  Fact Sheet for Patients: EntrepreneurPulse.com.au  Fact Sheet for Healthcare Providers: IncredibleEmployment.be  This test is not yet approved or cleared by the [place] FDA and has been authorized for detection and/or diagnosis of SARS-CoV-2 by FDA under an Emergency Use Authorization (EUA). This EUA will remain in effect (meaning this test can be used) for the duration of the COVID-19 declaration under Section 564(b)(1) of the  Act, 21 U.S.C. section 360bbb-3(b)(1), unless the authorization is terminated or revoked.  Performed at [facility], [place] [address], [place], [place] [address]      Labs: BNP (last 3 results) No results for input(s): BNP in the last 8760 hours. Basic Metabolic Panel: Recent Labs  Lab 08/15/20 1349 08/15/20 1411 08/16/20 0354 08/17/20 0148 08/18/20 0446 08/19/20 0432  NA 133* 132* 138 138 134* 135  K 3.6 3.6 3.5 3.2* 3.3* 3.9  CL 88* 93* 94* 97* 96* 98  CO2 23  --  24 28 27 25   GLUCOSE 79 81 86 105* 102* 93  BUN 41* 39* 37* 28* 17 14  CREATININE 2.95* 3.20* 2.73* 1.99* 1.54* 1.39*  CALCIUM 9.0  --  8.4* 8.3* 8.1* 8.7*  MG  --   --   --  1.4* 1.6*  --   PHOS  --   --   --  2.1* 2.1*  --    Liver Function Tests: Recent Labs  Lab 08/15/20 1349 08/16/20 0354 08/18/20 1011 08/19/20 0432  AST 58* 44* 38 34  ALT 48* 40 34 30  ALKPHOS 83 69 75 68  BILITOT 1.7* 2.7* 2.6* 2.2*  PROT 6.8 6.1* 6.7 6.7  ALBUMIN 3.9 3.3* 3.5 3.4*   Recent Labs  Lab 08/15/20 1349  LIPASE 51   No results for input(s): AMMONIA in the last 168 hours. CBC: Recent Labs  Lab 08/15/20 1349 08/15/20 1411 08/16/20 0354 08/17/20 0148 08/18/20 0446 08/18/20 1011 08/19/20 0432  WBC 5.8  --  4.8 3.7* 3.7*  --  4.1  NEUTROABS 4.1  --   --   --   --   --   --   HGB 13.5 13.3 12.1* 11.0* 12.4*  --  12.8*  HCT 38.8* 39.0 35.1* 32.8* 37.4*  --  38.4*  MCV 102.9*  --  103.2* 106.1* 108.4*  --  105.5*  PLT 102*  --  82* 80* 39* 99* 109*   Cardiac Enzymes: No results for input(s): CKTOTAL, CKMB, CKMBINDEX, TROPONINI in the last 168 hours. BNP: Invalid input(s): POCBNP CBG: Recent Labs  Lab 08/15/20 1610  GLUCAP 84   D-Dimer Recent Labs    08/18/20 1011  DDIMER 1.07*   Hgb A1c No results for input(s): HGBA1C in the last 72 hours. Lipid Profile No results for input(s): CHOL, HDL, LDLCALC, TRIG, CHOLHDL, LDLDIRECT in the last 72 hours. Thyroid function studies No results for  input(s): TSH, T4TOTAL, T3FREE, THYROIDAB in the last 72 hours.  Invalid input(s): FREET3 Anemia work up No results for input(s): VITAMINB12, FOLATE, FERRITIN, TIBC, IRON, RETICCTPCT in the last 72 hours. Urinalysis    Component Value Date/Time   COLORURINE YELLOW 08/16/2020 0133   APPEARANCEUR CLEAR 08/16/2020 0133   LABSPEC 1.013 08/16/2020 0133   PHURINE 5.0 08/16/2020 0133   GLUCOSEU NEGATIVE 08/16/2020 0133   HGBUR SMALL (A) 08/16/2020 0133   BILIRUBINUR NEGATIVE 08/16/2020 0133   KETONESUR 5 (A) 08/16/2020 0133   PROTEINUR 100 (A) 08/16/2020 0133   NITRITE NEGATIVE 08/16/2020 0133   LEUKOCYTESUR NEGATIVE 08/16/2020 0133   Sepsis Labs Invalid input(s): PROCALCITONIN,  WBC,  LACTICIDVEN Microbiology Recent Results (from the past 240 hour(s))  Resp Panel by RT-PCR (Flu A&B, Covid) Nasopharyngeal Swab     Status: None   Collection Time: 08/15/20  4:13 PM   Specimen: Nasopharyngeal Swab; Nasopharyngeal(NP) swabs in vial transport medium  Result Value Ref Range Status   SARS Coronavirus 2 by RT PCR NEGATIVE NEGATIVE Final    Comment: (NOTE) SARS-CoV-2 target nucleic acids are NOT DETECTED.  The SARS-CoV-2 RNA is generally detectable in upper respiratory specimens during the acute phase of infection. The lowest concentration of SARS-CoV-2 viral copies this assay can detect is 138 copies/mL. A negative result does not preclude SARS-Cov-2 infection and should not be used as the sole basis for treatment or other patient management decisions. A negative result may occur with  improper specimen collection/handling, submission of specimen other than nasopharyngeal swab, presence of viral mutation(s) within the areas targeted by this assay, and inadequate number of viral copies(<138 copies/mL). A negative result must be combined with clinical observations, patient history, and epidemiological information. The expected result is Negative.  Fact Sheet for Patients:   EntrepreneurPulse.com.au  Fact Sheet for Healthcare Providers:  IncredibleEmployment.be  This test is no t yet approved or cleared by the [place] FDA and  has been authorized for detection and/or diagnosis of SARS-CoV-2 by FDA under an Emergency Use Authorization (EUA). This  EUA will remain  in effect (meaning this test can be used) for the duration of the COVID-19 declaration under Section 564(b)(1) of the Act, 21 U.S.C.section 360bbb-3(b)(1), unless the authorization is terminated  or revoked sooner.       Influenza A by PCR NEGATIVE NEGATIVE Final   Influenza B by PCR NEGATIVE NEGATIVE Final    Comment: (NOTE) The Xpert Xpress SARS-CoV-2/FLU/RSV plus assay is intended as an aid in the diagnosis of influenza from Nasopharyngeal swab specimens and should not be used as a sole basis for treatment. Nasal washings and aspirates are unacceptable for Xpert Xpress SARS-CoV-2/FLU/RSV testing.  Fact Sheet for Patients: EntrepreneurPulse.com.au  Fact Sheet for Healthcare Providers: IncredibleEmployment.be  This test is not yet approved or cleared by the [place] FDA and has been authorized for detection and/or diagnosis of SARS-CoV-2 by FDA under an Emergency Use Authorization (EUA). This EUA will remain in effect (meaning this test can be used) for the duration of the COVID-19 declaration under Section 564(b)(1) of the Act, 21 U.S.C. section 360bbb-3(b)(1), unless the authorization is terminated or revoked.  Performed at [facility], [place] [address], [place], [place] [address]      Time coordinating discharge: Over 30 minutes  SIGNED:   Phillips Climes, MD  [facility] 08/19/2020, 4:50 PM Pager   If 7PM-7AM, please contact night-coverage [email or web address] Password [email or web address]

## 2020-08-19 NOTE — Progress Notes (Signed)
DISCHARGE NOTE HOME KYRON SCHLITT to be discharged Home per MD order. Discussed prescriptions and follow up appointments with the patient. Prescriptions given to patient; medication list explained in detail. Patient verbalized understanding.  Skin clean, dry and covered abrasions  with skin foam on both forearm. IV catheter discontinued with skin dry and intact. Site without signs and symptoms of complications. Dressing and pressure applied. Pt denies pain at the site currently. No complaints noted.  Patient free of lines, drains, and wounds.   An After Visit Summary (AVS) was printed and given to the patient. Patient escorted by volunteer via wheelchair, and discharged home via private auto with wife.  Dolores Hoose, RN

## 2020-08-19 NOTE — Plan of Care (Signed)
  Problem: Education: Goal: Knowledge of General Education information will improve Description: Including pain rating scale, medication(s)/side effects and non-pharmacologic comfort measures 08/19/2020 1104 by Dolores Hoose, RN Outcome: Adequate for Discharge 08/19/2020 0732 by Dolores Hoose, RN Outcome: Progressing   Problem: Acute Rehab PT Goals(only PT should resolve) Goal: Patient Will Transfer Sit To/From Stand Outcome: Adequate for Discharge Goal: Pt Will Ambulate Outcome: Adequate for Discharge Goal: Pt Will Go Up/Down Stairs Outcome: Adequate for Discharge Goal: Pt/caregiver will Perform Home Exercise Program Outcome: Adequate for Discharge

## 2020-08-19 NOTE — Telephone Encounter (Signed)
Transition Care Management Unsuccessful Follow-up Telephone Call  Date of discharge and from where:  [facility]19/[facility]   Attempts:  1st Attempt  Reason for unsuccessful TCM follow-up call:  Left voice message

## 2020-08-19 NOTE — Plan of Care (Signed)
  Problem: Education: Goal: Knowledge of General Education information will improve Description Including pain rating scale, medication(s)/side effects and non-pharmacologic comfort measures Outcome: Progressing   

## 2020-08-20 ENCOUNTER — Telehealth: Payer: Self-pay | Admitting: Hematology and Oncology

## 2020-08-20 NOTE — Telephone Encounter (Signed)
Transition Care Management Unsuccessful Follow-up Telephone Call  Date of discharge and from where:  [facility]   Attempts:  2nd Attempt  Reason for unsuccessful TCM follow-up call:  Left voice message

## 2020-08-20 NOTE — Telephone Encounter (Signed)
Transition Care Management Unsuccessful Follow-up Telephone Call  Date of discharge and from where:  08/19/2020, [facility]   Attempts:  3rd Attempt  Reason for unsuccessful TCM follow-up call:  No answer/busy

## 2020-08-20 NOTE — Telephone Encounter (Signed)
Scheduled appts per 5/25 sch msg. Called pt, no answer. Left msg with appts date and times.  

## 2020-09-03 DIAGNOSIS — R972 Elevated prostate specific antigen [PSA]: Secondary | ICD-10-CM | POA: Diagnosis not present

## 2020-09-03 DIAGNOSIS — E291 Testicular hypofunction: Secondary | ICD-10-CM | POA: Diagnosis not present

## 2020-09-03 LAB — PSA: PSA: 1.16

## 2020-09-08 ENCOUNTER — Other Ambulatory Visit: Payer: Self-pay | Admitting: Hematology and Oncology

## 2020-09-08 DIAGNOSIS — N189 Chronic kidney disease, unspecified: Secondary | ICD-10-CM

## 2020-09-08 DIAGNOSIS — D696 Thrombocytopenia, unspecified: Secondary | ICD-10-CM

## 2020-09-10 DIAGNOSIS — E291 Testicular hypofunction: Secondary | ICD-10-CM | POA: Diagnosis not present

## 2020-09-10 DIAGNOSIS — R972 Elevated prostate specific antigen [PSA]: Secondary | ICD-10-CM | POA: Diagnosis not present

## 2020-09-10 DIAGNOSIS — R351 Nocturia: Secondary | ICD-10-CM | POA: Diagnosis not present

## 2020-09-10 DIAGNOSIS — N401 Enlarged prostate with lower urinary tract symptoms: Secondary | ICD-10-CM | POA: Diagnosis not present

## 2020-09-10 DIAGNOSIS — N5201 Erectile dysfunction due to arterial insufficiency: Secondary | ICD-10-CM | POA: Diagnosis not present

## 2020-09-15 ENCOUNTER — Inpatient Hospital Stay: Payer: Medicare HMO | Attending: Hematology and Oncology

## 2020-09-15 ENCOUNTER — Ambulatory Visit (INDEPENDENT_AMBULATORY_CARE_PROVIDER_SITE_OTHER): Payer: Medicare HMO

## 2020-09-15 ENCOUNTER — Inpatient Hospital Stay: Payer: Medicare HMO | Admitting: Hematology and Oncology

## 2020-09-15 ENCOUNTER — Encounter: Payer: Self-pay | Admitting: Primary Care

## 2020-09-15 ENCOUNTER — Other Ambulatory Visit: Payer: Self-pay

## 2020-09-15 DIAGNOSIS — D696 Thrombocytopenia, unspecified: Secondary | ICD-10-CM

## 2020-09-15 DIAGNOSIS — Z79899 Other long term (current) drug therapy: Secondary | ICD-10-CM | POA: Insufficient documentation

## 2020-09-15 DIAGNOSIS — I129 Hypertensive chronic kidney disease with stage 1 through stage 4 chronic kidney disease, or unspecified chronic kidney disease: Secondary | ICD-10-CM | POA: Diagnosis not present

## 2020-09-15 DIAGNOSIS — Z0000 Encounter for general adult medical examination without abnormal findings: Secondary | ICD-10-CM | POA: Diagnosis not present

## 2020-09-15 DIAGNOSIS — N189 Chronic kidney disease, unspecified: Secondary | ICD-10-CM

## 2020-09-15 DIAGNOSIS — N182 Chronic kidney disease, stage 2 (mild): Secondary | ICD-10-CM | POA: Insufficient documentation

## 2020-09-15 LAB — CMP (CANCER CENTER ONLY)
ALT: 25 U/L (ref 0–44)
AST: 26 U/L (ref 15–41)
Albumin: 3.8 g/dL (ref 3.5–5.0)
Alkaline Phosphatase: 86 U/L (ref 38–126)
Anion gap: 11 (ref 5–15)
BUN: 15 mg/dL (ref 8–23)
CO2: 26 mmol/L (ref 22–32)
Calcium: 9.3 mg/dL (ref 8.9–10.3)
Chloride: 101 mmol/L (ref 98–111)
Creatinine: 1.39 mg/dL — ABNORMAL HIGH (ref 0.61–1.24)
GFR, Estimated: 54 mL/min — ABNORMAL LOW (ref >60)
Glucose, Bld: 102 mg/dL — ABNORMAL HIGH (ref 70–99)
Potassium: 4.1 mmol/L (ref 3.5–5.1)
Sodium: 138 mmol/L (ref 135–145)
Total Bilirubin: 1.1 mg/dL (ref 0.3–1.2)
Total Protein: 7.1 g/dL (ref 6.5–8.1)

## 2020-09-15 LAB — CBC WITH DIFFERENTIAL/PLATELET
Abs Immature Granulocytes: 0.12 10*3/uL — ABNORMAL HIGH (ref 0.00–0.07)
Basophils Absolute: 0.1 10*3/uL (ref 0.0–0.1)
Basophils Relative: 1 %
Eosinophils Absolute: 0.2 10*3/uL (ref 0.0–0.5)
Eosinophils Relative: 2 %
HCT: 40.5 % (ref 39.0–52.0)
Hemoglobin: 13.7 g/dL (ref 13.0–17.0)
Immature Granulocytes: 1 %
Lymphocytes Relative: 20 %
Lymphs Abs: 1.8 10*3/uL (ref 0.7–4.0)
MCH: 35.3 pg — ABNORMAL HIGH (ref 26.0–34.0)
MCHC: 33.8 g/dL (ref 30.0–36.0)
MCV: 104.4 fL — ABNORMAL HIGH (ref 80.0–100.0)
Monocytes Absolute: 0.7 10*3/uL (ref 0.1–1.0)
Monocytes Relative: 8 %
Neutro Abs: 6 10*3/uL (ref 1.7–7.7)
Neutrophils Relative %: 68 %
Platelets: 173 10*3/uL (ref 150–400)
RBC: 3.88 MIL/uL — ABNORMAL LOW (ref 4.22–5.81)
RDW: 13.3 % (ref 11.5–15.5)
WBC: 8.8 10*3/uL (ref 4.0–10.5)
nRBC: 0 % (ref 0.0–0.2)

## 2020-09-15 LAB — TESTOSTERONE: Testosterone: 450.4

## 2020-09-15 NOTE — Patient Instructions (Signed)
Mr. Devin Howell , Thank you for taking time to come for your Medicare Wellness Visit. I appreciate your ongoing commitment to your health goals. Please review the following plan we discussed and let me know if I can assist you in the future.   Screening recommendations/referrals: Colonoscopy: Up to date, completed 04/08/2017, due 03/2022 Recommended yearly ophthalmology/optometry visit for glaucoma screening and checkup Recommended yearly dental visit for hygiene and checkup  Vaccinations: Influenza vaccine: Up to date, completed 12/25/2019, due 10/2020 Pneumococcal vaccine: Completed series Tdap vaccine: Up to date, completed 12/16/2016, due 11/2026 Shingles vaccine: Completed series   Covid-19: Completed series  Advanced directives: Please bring a copy of your POA (Power of Attorney) and/or Living Will to your next appointment.   Conditions/risks identified: hypertension, hyperlipidemia   Next appointment: Follow up in one year for your annual wellness visit.   Preventive Care 101 Years and Older, Male Preventive care refers to lifestyle choices and visits with your health care provider that can promote health and wellness. What does preventive care include? A yearly physical exam. This is also called an annual well check. Dental exams once or twice a year. Routine eye exams. Ask your health care provider how often you should have your eyes checked. Personal lifestyle choices, including: Daily care of your teeth and gums. Regular physical activity. Eating a healthy diet. Avoiding tobacco and drug use. Limiting alcohol use. Practicing safe sex. Taking low doses of aspirin every day. Taking vitamin and mineral supplements as recommended by your health care provider. What happens during an annual well check? The services and screenings done by your health care provider during your annual well check will depend on your age, overall health, lifestyle risk factors, and family history of  disease. Counseling  Your health care provider may ask you questions about your: Alcohol use. Tobacco use. Drug use. Emotional well-being. Home and relationship well-being. Sexual activity. Eating habits. History of falls. Memory and ability to understand (cognition). Work and work Statistician. Screening  You may have the following tests or measurements: Height, weight, and BMI. Blood pressure. Lipid and cholesterol levels. These may be checked every 5 years, or more frequently if you are over 53 years old. Skin check. Lung cancer screening. You may have this screening every year starting at age 53 if you have a 30-pack-year history of smoking and currently smoke or have quit within the past 15 years. Fecal occult blood test (FOBT) of the stool. You may have this test every year starting at age 9. Flexible sigmoidoscopy or colonoscopy. You may have a sigmoidoscopy every 5 years or a colonoscopy every 10 years starting at age 100. Prostate cancer screening. Recommendations will vary depending on your family history and other risks. Hepatitis C blood test. Hepatitis B blood test. Sexually transmitted disease (STD) testing. Diabetes screening. This is done by checking your blood sugar (glucose) after you have not eaten for a while (fasting). You may have this done every 1-3 years. Abdominal aortic aneurysm (AAA) screening. You may need this if you are a current or former smoker. Osteoporosis. You may be screened starting at age 89 if you are at high risk. Talk with your health care provider about your test results, treatment options, and if necessary, the need for more tests. Vaccines  Your health care provider may recommend certain vaccines, such as: Influenza vaccine. This is recommended every year. Tetanus, diphtheria, and acellular pertussis (Tdap, Td) vaccine. You may need a Td booster every 10 years. Zoster vaccine. You may  need this after age 2. Pneumococcal 13-valent  conjugate (PCV13) vaccine. One dose is recommended after age 48. Pneumococcal polysaccharide (PPSV23) vaccine. One dose is recommended after age 55. Talk to your health care provider about which screenings and vaccines you need and how often you need them. This information is not intended to replace advice given to you by your health care provider. Make sure you discuss any questions you have with your health care provider. Document Released: 04/11/2015 Document Revised: 12/03/2015 Document Reviewed: 01/14/2015 Elsevier Interactive Patient Education  2017 [employer] Prevention in the Home Falls can cause injuries. They can happen to people of all ages. There are many things you can do to make your home safe and to help prevent falls. What can I do on the outside of my home? Regularly fix the edges of walkways and driveways and fix any cracks. Remove anything that might make you trip as you walk through a door, such as a raised step or threshold. Trim any bushes or trees on the path to your home. Use bright outdoor lighting. Clear any walking paths of anything that might make someone trip, such as rocks or tools. Regularly check to see if handrails are loose or broken. Make sure that both sides of any steps have handrails. Any raised decks and porches should have guardrails on the edges. Have any leaves, snow, or ice cleared regularly. Use sand or salt on walking paths during winter. Clean up any spills in your garage right away. This includes oil or grease spills. What can I do in the bathroom? Use night lights. Install grab bars by the toilet and in the tub and shower. Do not use towel bars as grab bars. Use non-skid mats or decals in the tub or shower. If you need to sit down in the shower, use a plastic, non-slip stool. Keep the floor dry. Clean up any water that spills on the floor as soon as it happens. Remove soap buildup in the tub or shower regularly. Attach bath mats  securely with double-sided non-slip rug tape. Do not have throw rugs and other things on the floor that can make you trip. What can I do in the bedroom? Use night lights. Make sure that you have a light by your bed that is easy to reach. Do not use any sheets or blankets that are too big for your bed. They should not hang down onto the floor. Have a firm chair that has side arms. You can use this for support while you get dressed. Do not have throw rugs and other things on the floor that can make you trip. What can I do in the kitchen? Clean up any spills right away. Avoid walking on wet floors. Keep items that you use a lot in easy-to-reach places. If you need to reach something above you, use a strong step stool that has a grab bar. Keep electrical cords out of the way. Do not use floor polish or wax that makes floors slippery. If you must use wax, use non-skid floor wax. Do not have throw rugs and other things on the floor that can make you trip. What can I do with my stairs? Do not leave any items on the stairs. Make sure that there are handrails on both sides of the stairs and use them. Fix handrails that are broken or loose. Make sure that handrails are as long as the stairways. Check any carpeting to make sure that it is firmly attached  to the stairs. Fix any carpet that is loose or worn. Avoid having throw rugs at the top or bottom of the stairs. If you do have throw rugs, attach them to the floor with carpet tape. Make sure that you have a light switch at the top of the stairs and the bottom of the stairs. If you do not have them, ask someone to add them for you. What else can I do to help prevent falls? Wear shoes that: Do not have high heels. Have rubber bottoms. Are comfortable and fit you well. Are closed at the toe. Do not wear sandals. If you use a stepladder: Make sure that it is fully opened. Do not climb a closed stepladder. Make sure that both sides of the stepladder  are locked into place. Ask someone to hold it for you, if possible. Clearly mark and make sure that you can see: Any grab bars or handrails. First and last steps. Where the edge of each step is. Use tools that help you move around (mobility aids) if they are needed. These include: Canes. Walkers. Scooters. Crutches. Turn on the lights when you go into a dark area. Replace any light bulbs as soon as they burn out. Set up your furniture so you have a clear path. Avoid moving your furniture around. If any of your floors are uneven, fix them. If there are any pets around you, be aware of where they are. Review your medicines with your doctor. Some medicines can make you feel dizzy. This can increase your chance of falling. Ask your doctor what other things that you can do to help prevent falls. This information is not intended to replace advice given to you by your health care provider. Make sure you discuss any questions you have with your health care provider. Document Released: 01/09/2009 Document Revised: 08/21/2015 Document Reviewed: 04/19/2014 Elsevier Interactive Patient Education  2017 [employer].

## 2020-09-15 NOTE — Progress Notes (Signed)
PCP notes:  Health Maintenance: No gaps noted    Abnormal Screenings: none   Patient concerns: none   Nurse concerns: none   Next PCP appt: none 

## 2020-09-15 NOTE — Progress Notes (Signed)
Subjective:   Devin Howell is a 70 y.o. male who presents for Medicare Annual/Subsequent preventive examination.  Review of Systems: N/A    I connected with patient today by a video enabled telemedicine application and verified that I am speaking with the correct person using two identifiers.  Location patient: home  Location nurse: work  Persons participating in the virtual visit: patient, nurse     I discussed the limitations, risks, security, and privacy concerns of performing an evaluation and management service by telephone and the availability of in person appointments. The patient expressed understanding and agreed to proceed.  Cardiac Risk Factors include: advanced age (>5men, >47 women);hypertension;male gender;Other (see comment), Risk factor comments: hyperlipidemia     Objective:    Today's Vitals   09/15/20 0856  PainSc: 0-No pain   There is no height or weight on file to calculate BMI.  Advanced Directives 09/15/2020 08/15/2020 02/08/2019 12/16/2016 05/09/2016  Does Patient Have a Medical Advance Directive? Yes No Yes Yes Yes  Type of Paramedic of [place];Living will - [place];Living will [place];Living will [place];Living will  Does patient want to make changes to medical advance directive? - - - - No - Patient declined  Copy of [place] in Chart? No - copy requested - No - copy requested No - copy requested No - copy requested  Would patient like information on creating a medical advance directive? - No - Patient declined - - -    Current Medications (verified) Outpatient Encounter Medications as of 09/15/2020  Medication Sig   allopurinol (ZYLOPRIM) 300 MG tablet TAKE 1 TABLET (300 MG TOTAL) BY MOUTH DAILY. FOR GOUT PREVENTION.   amLODipine (NORVASC) 5 MG tablet Take 5 mg by mouth daily.   atorvastatin (LIPITOR) 20 MG tablet Take 1 tablet (20 mg  total) by mouth daily. For cholesterol.   citalopram (CELEXA) 20 MG tablet TAKE 1 TABLET (20 MG TOTAL) BY MOUTH DAILY. FOR ANXIETY AND DEPRESSION.   levothyroxine (SYNTHROID) 50 MCG tablet Take 1 tablet by mouth every morning on an empty stomach with water only.  No food or other medications for 30 minutes. (Patient taking differently: Take 50 mcg by mouth See admin instructions. Take 1 tablet by mouth every morning on an empty stomach with water only.  No food or other medications for 30 minutes.)   metoprolol succinate (TOPROL-XL) 25 MG 24 hr tablet Take 25 mg by mouth daily.   tamsulosin (FLOMAX) 0.4 MG CAPS capsule Take 0.4 mg by mouth at bedtime.   testosterone cypionate (DEPOTESTOSTERONE CYPIONATE) 200 MG/ML injection Inject 100 mg into the muscle every 28 (twenty-eight) days.   No facility-administered encounter medications on file as of 09/15/2020.    Allergies (verified) Patient has no known allergies.   History: Past Medical History:  Diagnosis Date   Cataract    Chronic gout    CKD (chronic kidney disease)    pt. denies   Erectile dysfunction    GERD (gastroesophageal reflux disease)    Herpes simplex    Hx of adenomatous polyp of colon 04/12/2017   Hyperlipidemia    pt. denies   Hypertension    pt. denies   OSA on CPAP    Sleep apnea    cpap   Testosterone deficiency    Past Surgical History:  Procedure Laterality Date   COLONOSCOPY     TONSILLECTOMY AND ADENOIDECTOMY  1960   Family History  Problem Relation Age of Onset   Rectal cancer Mother    Colon polyps Neg Hx    Esophageal cancer Neg Hx    Colon cancer Neg Hx    Stomach cancer Neg Hx    Social History   Socioeconomic History   Marital status: Married    Spouse name: Not on file   Number of children: Not on file   Years of education: Not on file   Highest education level: Not on file  Occupational History   Not on file  Tobacco Use   Smoking status: Never   Smokeless tobacco: Never  Substance  and Sexual Activity   Alcohol use: Yes    Alcohol/week: 2.0 standard drinks    Types: 2 Glasses of wine per week   Drug use: No   Sexual activity: Not on file  Other Topics Concern   Not on file  Social History Narrative   Not on file   Social Determinants of Health   Financial Resource Strain: Low Risk    Difficulty of Paying Living Expenses: Not hard at all  Food Insecurity: No Food Insecurity   Worried About Charity fundraiser in the Last Year: Never true   [place] in the Last Year: Never true  Transportation Needs: No Transportation Needs   Lack of Transportation (Medical): No   Lack of Transportation (Non-Medical): No  Physical Activity: Sufficiently Active   Days of Exercise per Week: 4 days   Minutes of Exercise per Session: 60 min  Stress: No Stress Concern Present   Feeling of Stress : Not at all  Social Connections: Not on file    Tobacco Counseling Counseling given: Not Answered   Clinical Intake:  Pre-visit preparation completed: Yes  Pain : No/denies pain Pain Score: 0-No pain     Nutritional Risks: None Diabetes: No  How often do you need to have someone help you when you read instructions, pamphlets, or other written materials from your doctor or pharmacy?: 1 - Never What is the last grade level you completed in school?: College degree  Diabetic: No Nutrition Risk Assessment:  Has the patient had any N/V/D within the last 2 months?  No  Does the patient have any non-healing wounds?  No  Has the patient had any unintentional weight loss or weight gain?  No   Diabetes:  Is the patient diabetic?  No  If diabetic, was a CBG obtained today?   N/A Did the patient bring in their glucometer from home?   N/A How often do you monitor your CBG's? N/A.   Financial Strains and Diabetes Management:  Are you having any financial strains with the device, your supplies or your medication?  N/A .  Does the patient want to be seen by Chronic Care  Management for management of their diabetes?   N/A Would the patient like to be referred to a Nutritionist or for Diabetic Management?   N/A   Interpreter Needed?: No  Information entered by :: CJohnson, LPN   Activities of Daily Living In your present state of health, do you have any difficulty performing the following activities: 09/15/2020 08/15/2020  Hearing? Y N  Comment wears hearing aids -  Vision? N N  Difficulty concentrating or making decisions? N N  Walking or climbing stairs? N N  Dressing or bathing? N N  Doing errands, shopping? N N  Preparing Food and eating ? N -  Using the Toilet? N -  In  the past six months, have you accidently leaked urine? N -  Do you have problems with loss of bowel control? N -  Managing your Medications? N -  Managing your Finances? N -  Housekeeping or managing your Housekeeping? N -  Some recent data might be hidden    Patient Care Team: Pleas Koch, NP as PCP - General (Internal Medicine) Irine Seal, MD as Attending Physician (Urology)  Indicate any recent Medical Services you may have received from other than [facility] providers in the past year (date may be approximate).     Assessment:   This is a routine wellness examination for [place].  Hearing/Vision screen Vision Screening - Comments:: Patient gets annual eye exams   Dietary issues and exercise activities discussed: Current Exercise Habits: Home exercise routine, Type of exercise: walking, Time (Minutes): 60, Frequency (Times/Week): 4, Weekly Exercise (Minutes/Week): 240, Intensity: Moderate, Exercise limited by: None identified   Goals Addressed             This Visit's Progress    Patient Stated       09/15/2020, I will continue to walk 4 days a week for several miles.         Depression Screen PHQ 2/9 Scores 09/15/2020 02/19/2020 12/13/2019 12/13/2019 02/08/2019 12/16/2016  PHQ - 2 Score 0 0 6 6 0 0  PHQ- 9 Score 0 0 23 20 0 0    Fall Risk Fall Risk   09/15/2020 02/19/2020 12/13/2019 02/08/2019 12/16/2016  Falls in the past year? 1 0 0 0 No  Number falls in past yr: 1 0 0 0 -  Injury with Fall? 0 0 0 0 -  Risk for fall due to : Medication side effect - - Medication side effect -  Follow up Falls evaluation completed;Falls prevention discussed - - Falls evaluation completed;Falls prevention discussed -    FALL RISK PREVENTION PERTAINING TO THE HOME:  Any stairs in or around the home? Yes  If so, are there any without handrails? No  Home free of loose throw rugs in walkways, pet beds, electrical cords, etc? Yes  Adequate lighting in your home to reduce risk of falls? Yes   ASSISTIVE DEVICES UTILIZED TO PREVENT FALLS:  Life alert? No  Use of a cane, walker or w/c? No  Grab bars in the bathroom? No  Shower chair or bench in shower? No  Elevated toilet seat or a handicapped toilet? No   TIMED UP AND GO:  Was the test performed?  N/A video/telephone visit .  Cognitive Function: MMSE - Mini Mental State Exam 09/15/2020 02/08/2019 12/16/2016  Orientation to time 5 5 5   Orientation to Place 5 5 5   Registration 3 3 3   Attention/ Calculation 5 5 0  Recall 3 3 3   Language- name 2 objects - - 0  Language- repeat 1 1 1   Language- follow 3 step command - - 3  Language- read & follow direction - - 0  Write a sentence - - 0  Copy design - - 0  Total score - - 20  Mini Cog  Mini-Cog screen was completed. Maximum score is 22. A value of 0 denotes this part of the MMSE was not completed or the patient failed this part of the Mini-Cog screening.       Immunizations Immunization History  Administered Date(s) Administered   Influenza, High Dose Seasonal PF 12/08/2016   Influenza,inj,Quad PF,6+ Mos 11/02/2018   Influenza-Unspecified 11/27/2017, 12/25/2019   PFIZER(Purple Top)SARS-COV-2 Vaccination 04/23/2019,  05/14/2019, 12/25/2019, 05/26/2020   Pneumococcal Conjugate-13 02/12/2015   Pneumococcal Polysaccharide-23 12/16/2016   Td  12/16/2016   Zoster Recombinat (Shingrix) 10/14/2016, 03/13/2017    TDAP status: Up to date  Flu Vaccine status: Up to date  Pneumococcal vaccine status: Up to date  Covid-19 vaccine status: Completed vaccines  Qualifies for Shingles Vaccine? Yes   Zostavax completed No   Shingrix Completed?: Yes  Screening Tests Health Maintenance  Topic Date Due   COVID-19 Vaccine (5 - Booster for Pfizer series) 09/23/2020   INFLUENZA VACCINE  10/27/2020   COLONOSCOPY (Pts 45-46yrs Insurance coverage will need to be confirmed)  04/08/2022   TETANUS/TDAP  12/17/2026   Hepatitis C Screening  Completed   PNA vac Low Risk Adult  Completed   Zoster Vaccines- Shingrix  Completed   HPV VACCINES  Aged Out    Health Maintenance  There are no preventive care reminders to display for this patient.   Colorectal cancer screening: Type of screening: Colonoscopy. Completed 04/08/2017. Repeat every 5 years  Lung Cancer Screening: (Low Dose CT Chest recommended if Age 18-80 years, 30 pack-year currently smoking OR have quit w/in 15years.) does not qualify  Additional Screening:  Hepatitis C Screening: does qualify; Completed 08/15/2020  Vision Screening: Recommended annual ophthalmology exams for early detection of glaucoma and other disorders of the eye. Is the patient up to date with their annual eye exam?  Yes  Who is the provider or what is the name of the office in which the patient attends annual eye exams? Does not remember name currently If pt is not established with a provider, would they like to be referred to a provider to establish care? No .   Dental Screening: Recommended annual dental exams for proper oral hygiene  Community Resource Referral / Chronic Care Management: CRR required this visit?  No   CCM required this visit?  No      Plan:     I have personally reviewed and noted the following in the patient's chart:   Medical and social history Use of alcohol, tobacco or  illicit drugs  Current medications and supplements including opioid prescriptions. Patient is not currently taking opioid prescriptions. Functional ability and status Nutritional status Physical activity Advanced directives List of other physicians Hospitalizations, surgeries, and ER visits in previous 12 months Vitals Screenings to include cognitive, depression, and falls Referrals and appointments  In addition, I have reviewed and discussed with patient certain preventive protocols, quality metrics, and best practice recommendations. A written personalized care plan for preventive services as well as general preventive health recommendations were provided to patient.   Due to this being a telephonic visit, the after visit summary with patients personalized plan was offered to patient via office or my-chart. Patient preferred to pick up at office at next visit or via mychart.   Andrez Grime, LPN   [id number]

## 2020-09-16 ENCOUNTER — Encounter: Payer: Self-pay | Admitting: Hematology and Oncology

## 2020-09-16 NOTE — Progress Notes (Signed)
[facility] PROGRESS NOTE  Pleas Koch, NP  ASSESSMENT & PLAN:  Thrombocytopenia ([place]) His thrombocytopenia has resolved The patient has modified his lifestyle significantly He has lost weight He does not need long-term follow-up  CKD (chronic kidney disease) He has stable chronic kidney disease We discussed the importance of adequate oral fluid hydration and aggressive risk factor modification such as good control of his blood pressure He will follow with his primary care doctor He is not anemic today  No orders of the defined types were placed in this encounter.   The total time spent in the appointment was 20 minutes encounter with patients including review of chart and various tests results, discussions about plan of care and coordination of care plan   All questions were answered. The patient knows to call the clinic with any problems, questions or concerns. No barriers to learning was detected.    Heath Lark, MD 6/21/202210:50 AM  INTERVAL HISTORY: Devin Howell 70 y.o. male returns for hospital follow-up When I saw him in the hospital, the patient was recovering from his fall and subdural hematoma Today, he felt great He stopped drinking alcohol He has lost some weight He is hydrating daily with plenty of water daily The patient denies any recent signs or symptoms of bleeding such as spontaneous epistaxis, hematuria or hematochezia.   SUMMARY OF HEMATOLOGIC HISTORY: Please see my consult note from the hospital dated Aug 18, 2020 for further details HPI: Devin Howell is a 70 year old male with a past medical history significant for CKD stage II, hypertension, hyperlipidemia, hypothyroidism, BPH, anxiety/depression, gout.  He presented to the emergency room with nausea and vomiting.  The patient recently traveled and when he returned developed nausea, vomiting, diarrhea.  He has been feeling dehydrated for the past few days.  When he got  up to go to the bathroom, he felt lightheaded and fell down his right eye and right side of the forehead against the wall.  On admission, WBC was 5.8, hemoglobin 13.5, MCV 102.9, platelets 102,000.  His BUN was 41 and creatinine was 2.95 (baseline creatinine 1.5-1.7).  On admission, a CT of the head showed trace left parietal occipital subarachnoid hemorrhage, trace subdural hemorrhage over the left cerebral convexity without mass-effect, small parietal scalp hematoma.  CT of the abdomen/pelvis showed fatty liver.  An ultrasound of his right upper quadrant was obtained which showed hepatic steatosis.     This admission, blood counts have slowly declined and today's CBC shows a WBC of 3.7, hemoglobin 12.4, MCV 108.4, platelets 39,000.  DIC panel was drawn around 10 AM this morning and was notable for elevated fibrinogen of 625, D-dimer 1.07, platelets are now up to 99,000, and smear review showed no schistocytes.  A platelet transfusion has been ordered for today.  His most recent CBC available to me prior to this admission was performed on 02/19/2020 and at that time, his WBC was 3.9, platelets 121,000, hemoglobin 14.8, MCV 104.5.  Looking back at his prior CBCs, he has had intermittent leukopenia and thrombocytopenia for the past 3 to 4 years.   The patient was seen in his hospital room.  His wife is at the bedside.  The patient just returned from walking with physical therapy.  He reports that he did not have any dizziness or lightheadedness with walking.  His abdominal symptoms have improved and he has not having any further nausea, vomiting, diarrhea.  He notes bruising to his right eye and arms  from his prior fall.  He denies active bleeding such as epistaxis, hemoptysis, hematemesis, hematuria, melena, hematochezia.  Denies history of tobacco use.  He reports that he drinks 2 to 3 glasses of alcohol per day.  Hematology was asked see the patient make recommendations regarding his pancytopenia. I have  reviewed the past medical history, past surgical history, social history and family history with the patient and they are unchanged from previous note.  ALLERGIES:  has No Known Allergies.  MEDICATIONS:  Current Outpatient Medications  Medication Sig Dispense Refill   allopurinol (ZYLOPRIM) 300 MG tablet TAKE 1 TABLET (300 MG TOTAL) BY MOUTH DAILY. FOR GOUT PREVENTION. 90 tablet 2   amLODipine (NORVASC) 5 MG tablet Take 5 mg by mouth daily.     atorvastatin (LIPITOR) 20 MG tablet Take 1 tablet (20 mg total) by mouth daily. For cholesterol. 90 tablet 2   citalopram (CELEXA) 20 MG tablet TAKE 1 TABLET (20 MG TOTAL) BY MOUTH DAILY. FOR ANXIETY AND DEPRESSION. 90 tablet 2   levothyroxine (SYNTHROID) 50 MCG tablet Take 1 tablet by mouth every morning on an empty stomach with water only.  No food or other medications for 30 minutes. (Patient taking differently: Take 50 mcg by mouth See admin instructions. Take 1 tablet by mouth every morning on an empty stomach with water only.  No food or other medications for 30 minutes.) 90 tablet 2   metoprolol succinate (TOPROL-XL) 25 MG 24 hr tablet Take 25 mg by mouth daily.     tamsulosin (FLOMAX) 0.4 MG CAPS capsule Take 0.4 mg by mouth at bedtime.     testosterone cypionate (DEPOTESTOSTERONE CYPIONATE) 200 MG/ML injection Inject 100 mg into the muscle every 28 (twenty-eight) days.     No current facility-administered medications for this visit.     REVIEW OF SYSTEMS:   Constitutional: Denies fevers, chills or night sweats Eyes: Denies blurriness of vision Ears, nose, mouth, throat, and face: Denies mucositis or sore throat Respiratory: Denies cough, dyspnea or wheezes Cardiovascular: Denies palpitation, chest discomfort or lower extremity swelling Gastrointestinal:  Denies nausea, heartburn or change in bowel habits Skin: Denies abnormal skin rashes Lymphatics: Denies new lymphadenopathy or easy bruising Neurological:Denies numbness, tingling or new  weaknesses Behavioral/Psych: Mood is stable, no new changes  All other systems were reviewed with the patient and are negative.  PHYSICAL EXAMINATION: ECOG PERFORMANCE STATUS: 0 - Asymptomatic  Vitals:   09/15/20 1144  BP: 129/62  Pulse: 68  Resp: 13  Temp: 98.2 F (36.8 C)  SpO2: 98%   Filed Weights   09/15/20 1144  Weight: 228 lb (103.4 kg)    GENERAL:alert, no distress and comfortable NEURO: alert & oriented x 3 with fluent speech, no focal motor/sensory deficits  LABORATORY DATA:  I have reviewed the data as listed     Component Value Date/Time   NA 138 09/15/2020 1124   NA 143 09/13/2017 1248   K 4.1 09/15/2020 1124   CL 101 09/15/2020 1124   CO2 26 09/15/2020 1124   GLUCOSE 102 (H) 09/15/2020 1124   BUN 15 09/15/2020 1124   BUN 15 09/13/2017 1248   CREATININE 1.39 (H) 09/15/2020 1124   CREATININE 2.94 (H) 01/20/2018 1451   CALCIUM 9.3 09/15/2020 1124   PROT 7.1 09/15/2020 1124   ALBUMIN 3.8 09/15/2020 1124   AST 26 09/15/2020 1124   ALT 25 09/15/2020 1124   ALKPHOS 86 09/15/2020 1124   BILITOT 1.1 09/15/2020 1124   GFRNONAA 54 (L) 09/15/2020 1124  GFRAA 52 (L) 09/13/2017 1248    No results found for: SPEP, UPEP  Lab Results  Component Value Date   WBC 8.8 09/15/2020   NEUTROABS 6.0 09/15/2020   HGB 13.7 09/15/2020   HCT 40.5 09/15/2020   MCV 104.4 (H) 09/15/2020   PLT 173 09/15/2020      Chemistry      Component Value Date/Time   NA 138 09/15/2020 1124   NA 143 09/13/2017 1248   K 4.1 09/15/2020 1124   CL 101 09/15/2020 1124   CO2 26 09/15/2020 1124   BUN 15 09/15/2020 1124   BUN 15 09/13/2017 1248   CREATININE 1.39 (H) 09/15/2020 1124   CREATININE 2.94 (H) 01/20/2018 1451      Component Value Date/Time   CALCIUM 9.3 09/15/2020 1124   ALKPHOS 86 09/15/2020 1124   AST 26 09/15/2020 1124   ALT 25 09/15/2020 1124   BILITOT 1.1 09/15/2020 1124

## 2020-09-16 NOTE — Assessment & Plan Note (Signed)
He has stable chronic kidney disease We discussed the importance of adequate oral fluid hydration and aggressive risk factor modification such as good control of his blood pressure He will follow with his primary care doctor He is not anemic today

## 2020-09-16 NOTE — Assessment & Plan Note (Signed)
His thrombocytopenia has resolved The patient has modified his lifestyle significantly He has lost weight He does not need long-term follow-up

## 2020-09-18 DIAGNOSIS — N1831 Chronic kidney disease, stage 3a: Secondary | ICD-10-CM | POA: Diagnosis not present

## 2020-09-24 DIAGNOSIS — R809 Proteinuria, unspecified: Secondary | ICD-10-CM | POA: Diagnosis not present

## 2020-09-24 DIAGNOSIS — R3911 Hesitancy of micturition: Secondary | ICD-10-CM | POA: Diagnosis not present

## 2020-09-24 DIAGNOSIS — N1831 Chronic kidney disease, stage 3a: Secondary | ICD-10-CM | POA: Diagnosis not present

## 2020-09-24 DIAGNOSIS — I129 Hypertensive chronic kidney disease with stage 1 through stage 4 chronic kidney disease, or unspecified chronic kidney disease: Secondary | ICD-10-CM | POA: Diagnosis not present

## 2020-09-24 DIAGNOSIS — N200 Calculus of kidney: Secondary | ICD-10-CM | POA: Diagnosis not present

## 2020-10-17 ENCOUNTER — Encounter: Payer: Self-pay | Admitting: Primary Care

## 2020-10-17 ENCOUNTER — Other Ambulatory Visit: Payer: Self-pay

## 2020-10-17 ENCOUNTER — Ambulatory Visit (INDEPENDENT_AMBULATORY_CARE_PROVIDER_SITE_OTHER): Payer: Medicare HMO | Admitting: Primary Care

## 2020-10-17 ENCOUNTER — Other Ambulatory Visit: Payer: Self-pay | Admitting: Primary Care

## 2020-10-17 VITALS — BP 140/82 | HR 80 | Temp 98.8°F | Ht 68.0 in | Wt 226.0 lb

## 2020-10-17 DIAGNOSIS — R197 Diarrhea, unspecified: Secondary | ICD-10-CM | POA: Diagnosis not present

## 2020-10-17 DIAGNOSIS — R112 Nausea with vomiting, unspecified: Secondary | ICD-10-CM

## 2020-10-17 LAB — COMPREHENSIVE METABOLIC PANEL
ALT: 41 U/L (ref 0–53)
AST: 41 U/L — ABNORMAL HIGH (ref 0–37)
Albumin: 4.1 g/dL (ref 3.5–5.2)
Alkaline Phosphatase: 79 U/L (ref 39–117)
BUN: 25 mg/dL — ABNORMAL HIGH (ref 6–23)
CO2: 31 mEq/L (ref 19–32)
Calcium: 9.3 mg/dL (ref 8.4–10.5)
Chloride: 96 mEq/L (ref 96–112)
Creatinine, Ser: 1.56 mg/dL — ABNORMAL HIGH (ref 0.40–1.50)
GFR: 44.4 mL/min — ABNORMAL LOW (ref >60.00)
Glucose, Bld: 103 mg/dL — ABNORMAL HIGH (ref 70–99)
Potassium: 3.8 mEq/L (ref 3.5–5.1)
Sodium: 141 mEq/L (ref 135–145)
Total Bilirubin: 1.6 mg/dL — ABNORMAL HIGH (ref 0.2–1.2)
Total Protein: 7.1 g/dL (ref 6.0–8.3)

## 2020-10-17 LAB — CBC WITH DIFFERENTIAL/PLATELET
Basophils Absolute: 0 10*3/uL (ref 0.0–0.1)
Basophils Relative: 0.5 % (ref 0.0–3.0)
Eosinophils Absolute: 0 10*3/uL (ref 0.0–0.7)
Eosinophils Relative: 0.3 % (ref 0.0–5.0)
HCT: 42.7 % (ref 39.0–52.0)
Hemoglobin: 14.1 g/dL (ref 13.0–17.0)
Lymphocytes Relative: 14 % (ref 12.0–46.0)
Lymphs Abs: 0.8 10*3/uL (ref 0.7–4.0)
MCHC: 33 g/dL (ref 30.0–36.0)
MCV: 107.9 fl — ABNORMAL HIGH (ref 78.0–100.0)
Monocytes Absolute: 0.5 10*3/uL (ref 0.1–1.0)
Monocytes Relative: 8.6 % (ref 3.0–12.0)
Neutro Abs: 4.4 10*3/uL (ref 1.4–7.7)
Neutrophils Relative %: 76.6 % (ref 43.0–77.0)
Platelets: 117 10*3/uL — ABNORMAL LOW (ref 150.0–400.0)
RBC: 3.96 Mil/uL — ABNORMAL LOW (ref 4.22–5.81)
RDW: 16 % — ABNORMAL HIGH (ref 11.5–15.5)
WBC: 5.7 10*3/uL (ref 4.0–10.5)

## 2020-10-17 LAB — HEMOCCULT GUIAC POC 1CARD (OFFICE): Fecal Occult Blood, POC: NEGATIVE

## 2020-10-17 MED ORDER — ONDANSETRON 4 MG PO TBDP
4.0000 mg | ORAL_TABLET | Freq: Once | ORAL | Status: AC
Start: 2020-10-17 — End: 2020-10-17
  Administered 2020-10-17: 4 mg via ORAL

## 2020-10-17 MED ORDER — ONDANSETRON 4 MG PO TBDP: 4.0000 mg | ORAL_TABLET | Freq: Three times a day (TID) | ORAL | 0 refills | Status: DC | PRN

## 2020-10-17 NOTE — Progress Notes (Signed)
Subjective:    Patient ID: Devin Howell, male    DOB: 03/23/1951, 70 y.o.   MRN: [id number]  HPI  Devin Howell is a very pleasant 70 y.o. male with a history of hypertension, subdural hematoma, CKD, testosterone deficiency, anxiety and depression, N/V/D, dehydration, thrombocytopenia, who presents today to discuss nausea, vomiting, and loose stools.  Symptoms began about 10 days ago with fatigue, nausea, vomiting. He then began to notice lower extremity edema, difficulty walking, imbalance. He was at the beach two days ago, woke up one night for diarrhea, "blacked out" and fell on the floor, never had complete syncopal episode.  He cannot eat or drink, will vomit up all food. He's drinking 8-16 ounces daily. Vomiting occurs without eating too.   He is vomiting 10+ episodes daily, 4-5 episodes of diarrhea daily. He has noticed dark stools for over one week. He does have GI "upset" that is generalized when he vomits, otherwise has no pain. His wife has no symptoms.  He denies NSAID use, changes in his diet, medication changes, esophageal burning or reflux, abdominal pain.  Admitted in May 2022 for same symptoms, was presumed to have gastritis, CT negative.   BP Readings from Last 3 Encounters:  10/17/20 140/82  09/15/20 129/62  08/19/20 (!) 146/81      Review of Systems  Constitutional:  Positive for fatigue. Negative for fever.  Gastrointestinal:  Positive for diarrhea, nausea and vomiting. Negative for abdominal pain.       Dark stools  Neurological:  Positive for dizziness.        Past Medical History:  Diagnosis Date   Cataract    Chronic gout    CKD (chronic kidney disease)    pt. denies   Erectile dysfunction    GERD (gastroesophageal reflux disease)    Herpes simplex    Hx of adenomatous polyp of colon 04/12/2017   Hyperlipidemia    pt. denies   Hypertension    pt. denies   OSA on CPAP    Sleep apnea    cpap   Testosterone deficiency     Social  History   Socioeconomic History   Marital status: Married    Spouse name: Not on file   Number of children: Not on file   Years of education: Not on file   Highest education level: Not on file  Occupational History   Not on file  Tobacco Use   Smoking status: Never   Smokeless tobacco: Never  Substance and Sexual Activity   Alcohol use: Yes    Alcohol/week: 2.0 standard drinks    Types: 2 Glasses of wine per week   Drug use: No   Sexual activity: Not on file  Other Topics Concern   Not on file  Social History Narrative   Not on file   Social Determinants of Health   Financial Resource Strain: Low Risk    Difficulty of Paying Living Expenses: Not hard at all  Food Insecurity: No Food Insecurity   Worried About Charity fundraiser in the Last Year: Never true   [place] in the Last Year: Never true  Transportation Needs: No Transportation Needs   Lack of Transportation (Medical): No   Lack of Transportation (Non-Medical): No  Physical Activity: Sufficiently Active   Days of Exercise per Week: 4 days   Minutes of Exercise per Session: 60 min  Stress: No Stress Concern Present   Feeling of Stress : Not at all  Social Connections: Not on file  Intimate Partner Violence: Not At Risk   Fear of Current or Ex-Partner: No   Emotionally Abused: No   Physically Abused: No   Sexually Abused: No    Past Surgical History:  Procedure Laterality Date   COLONOSCOPY     TONSILLECTOMY AND ADENOIDECTOMY  1960    Family History  Problem Relation Age of Onset   Rectal cancer Mother    Colon polyps Neg Hx    Esophageal cancer Neg Hx    Colon cancer Neg Hx    Stomach cancer Neg Hx     No Known Allergies  Current Outpatient Medications on File Prior to Visit  Medication Sig Dispense Refill   allopurinol (ZYLOPRIM) 300 MG tablet TAKE 1 TABLET (300 MG TOTAL) BY MOUTH DAILY. FOR GOUT PREVENTION. 90 tablet 2   amLODipine (NORVASC) 5 MG tablet Take 5 mg by mouth daily.      atorvastatin (LIPITOR) 20 MG tablet Take 1 tablet (20 mg total) by mouth daily. For cholesterol. 90 tablet 2   citalopram (CELEXA) 20 MG tablet TAKE 1 TABLET (20 MG TOTAL) BY MOUTH DAILY. FOR ANXIETY AND DEPRESSION. 90 tablet 2   furosemide (LASIX) 20 MG tablet Take 20 mg by mouth 3 (three) times a week.     levothyroxine (SYNTHROID) 50 MCG tablet Take 1 tablet by mouth every morning on an empty stomach with water only.  No food or other medications for 30 minutes. (Patient taking differently: Take 50 mcg by mouth See admin instructions. Take 1 tablet by mouth every morning on an empty stomach with water only.  No food or other medications for 30 minutes.) 90 tablet 2   metoprolol succinate (TOPROL-XL) 25 MG 24 hr tablet Take 25 mg by mouth daily.     tamsulosin (FLOMAX) 0.4 MG CAPS capsule Take 0.4 mg by mouth at bedtime.     testosterone cypionate (DEPOTESTOSTERONE CYPIONATE) 200 MG/ML injection Inject 100 mg into the muscle every 28 (twenty-eight) days.     No current facility-administered medications on file prior to visit.    BP 140/82   Pulse 80   Temp 98.8 F (37.1 C) (Temporal)   Ht '5\' 8"'$  (1.727 m)   Wt 226 lb (102.5 kg)   SpO2 94%   BMI 34.36 kg/m  Objective:   Physical Exam Constitutional:      General: He is not in acute distress.    Appearance: He is ill-appearing.     Comments: Appears fatigued   Cardiovascular:     Rate and Rhythm: Normal rate and regular rhythm.  Pulmonary:     Effort: Pulmonary effort is normal.     Breath sounds: Normal breath sounds.  Abdominal:     General: Abdomen is flat.     Palpations: Abdomen is soft.     Tenderness: There is no abdominal tenderness.  Musculoskeletal:     Comments: Unsteady gait when walking, was able to get up and down from exam table with assist.   Skin:    General: Skin is warm and dry.  Neurological:     Mental Status: He is oriented to person, place, and time.  Psychiatric:        Mood and Affect: Mood normal.           Assessment & Plan:      This visit occurred during the SARS-CoV-2 public health emergency.  Safety protocols were in place, including screening questions prior to the visit, additional usage  of staff PPE, and extensive cleaning of exam room while observing appropriate contact time as indicated for disinfecting solutions.

## 2020-10-17 NOTE — Patient Instructions (Addendum)
Stop by the lab prior to leaving today. I will notify you of your results once received.   You may take the Zofran every 8 hours as needed for nausea and vomiting.  You MUST work on increasing water and electrolyte intake.  Return the stool kit as discussed.   Please go to the hospital if things get worse.  It was a pleasure to see you today!

## 2020-10-17 NOTE — Assessment & Plan Note (Signed)
Same symptoms from May 2022 when admitted for gastritis and dehydration. He appears very fatigued today, unsteady.  Discussed that he likely needs ED evaluation for IV fluids and stability. He would prefer to avoid the ED if possible. Will collect some STAT labs including CBC with diff and CMP. If significantly abnormal then will send to the hospital. Will also send them home with a stool kit to check stool studies.   He became nauseated while in the office so he was provided with Zofran ODT 4 mg, he did improve. Rx for Zofran ODT sent to pharmacy. Strongly advised to increase fluid intake.   Strict ED precautions provided.

## 2020-10-20 NOTE — Telephone Encounter (Signed)
FYI

## 2020-10-21 ENCOUNTER — Other Ambulatory Visit (INDEPENDENT_AMBULATORY_CARE_PROVIDER_SITE_OTHER): Payer: Medicare HMO

## 2020-10-21 ENCOUNTER — Other Ambulatory Visit: Payer: Self-pay

## 2020-10-21 ENCOUNTER — Other Ambulatory Visit (INDEPENDENT_AMBULATORY_CARE_PROVIDER_SITE_OTHER): Payer: Medicare HMO | Admitting: Primary Care

## 2020-10-21 DIAGNOSIS — R197 Diarrhea, unspecified: Secondary | ICD-10-CM

## 2020-10-21 DIAGNOSIS — R112 Nausea with vomiting, unspecified: Secondary | ICD-10-CM | POA: Diagnosis not present

## 2020-10-21 DIAGNOSIS — K2970 Gastritis, unspecified, without bleeding: Secondary | ICD-10-CM | POA: Diagnosis not present

## 2020-10-21 LAB — BASIC METABOLIC PANEL
BUN: 17 mg/dL (ref 6–23)
CO2: 29 mEq/L (ref 19–32)
Calcium: 9.2 mg/dL (ref 8.4–10.5)
Chloride: 97 mEq/L (ref 96–112)
Creatinine, Ser: 1.37 mg/dL (ref 0.40–1.50)
GFR: 51.89 mL/min — ABNORMAL LOW (ref >60.00)
Glucose, Bld: 88 mg/dL (ref 70–99)
Potassium: 3.4 mEq/L — ABNORMAL LOW (ref 3.5–5.1)
Sodium: 139 mEq/L (ref 135–145)

## 2020-10-24 LAB — GASTROINTESTINAL PATHOGEN PANEL PCR
C. difficile Tox A/B, PCR: NOT DETECTED
Campylobacter, PCR: NOT DETECTED
Cryptosporidium, PCR: NOT DETECTED
E coli (ETEC) LT/ST PCR: NOT DETECTED
E coli (STEC) stx1/stx2, PCR: NOT DETECTED
E coli 0157, PCR: NOT DETECTED
Giardia lamblia, PCR: NOT DETECTED
Norovirus, PCR: NOT DETECTED
Rotavirus A, PCR: NOT DETECTED
Salmonella, PCR: NOT DETECTED
Shigella, PCR: NOT DETECTED

## 2020-11-06 DIAGNOSIS — D2272 Melanocytic nevi of left lower limb, including hip: Secondary | ICD-10-CM | POA: Diagnosis not present

## 2020-11-06 DIAGNOSIS — Z85828 Personal history of other malignant neoplasm of skin: Secondary | ICD-10-CM | POA: Diagnosis not present

## 2020-11-06 DIAGNOSIS — D0462 Carcinoma in situ of skin of left upper limb, including shoulder: Secondary | ICD-10-CM | POA: Diagnosis not present

## 2020-11-06 DIAGNOSIS — L570 Actinic keratosis: Secondary | ICD-10-CM | POA: Diagnosis not present

## 2020-11-06 DIAGNOSIS — C44612 Basal cell carcinoma of skin of right upper limb, including shoulder: Secondary | ICD-10-CM | POA: Diagnosis not present

## 2020-11-06 DIAGNOSIS — D2271 Melanocytic nevi of right lower limb, including hip: Secondary | ICD-10-CM | POA: Diagnosis not present

## 2020-11-06 DIAGNOSIS — D225 Melanocytic nevi of trunk: Secondary | ICD-10-CM | POA: Diagnosis not present

## 2020-11-06 DIAGNOSIS — D22121 Melanocytic nevi of left upper eyelid, including canthus: Secondary | ICD-10-CM | POA: Diagnosis not present

## 2020-11-06 DIAGNOSIS — L814 Other melanin hyperpigmentation: Secondary | ICD-10-CM | POA: Diagnosis not present

## 2020-11-26 ENCOUNTER — Encounter: Payer: Self-pay | Admitting: Physician Assistant

## 2020-11-26 ENCOUNTER — Ambulatory Visit: Payer: Medicare HMO | Admitting: Physician Assistant

## 2020-11-26 VITALS — BP 138/76 | HR 83 | Ht 68.0 in | Wt 231.2 lb

## 2020-11-26 DIAGNOSIS — R197 Diarrhea, unspecified: Secondary | ICD-10-CM

## 2020-11-26 DIAGNOSIS — R112 Nausea with vomiting, unspecified: Secondary | ICD-10-CM

## 2020-11-26 NOTE — Patient Instructions (Addendum)
Continue Zofran as needed for nausea/vomiting.  You have been scheduled for an endoscopy. Please follow written instructions given to you at your visit today. If you use inhalers (even only as needed), please bring them with you on the day of your procedure.  If you are age 71 or older, your body mass index should be between 23-30. Your Body mass index is 35.15 kg/m. If this is out of the aforementioned range listed, please consider follow up with your Primary Care Provider.  If you are age 64 or younger, your body mass index should be between 19-25. Your Body mass index is 35.15 kg/m. If this is out of the aformentioned range listed, please consider follow up with your Primary Care Provider.   __________________________________________________________  The [facility] providers would like to encourage you to use [facility] to communicate with providers for non-urgent requests or questions.  Due to long hold times on the telephone, sending your provider a message by [facility] may be a faster and more efficient way to get a response.  Please allow 48 business hours for a response.  Please remember that this is for non-urgent requests.

## 2020-11-26 NOTE — Progress Notes (Signed)
Chief Complaint: Nausea  HPI:    Mr. Rack is a 70 year old male with a past medical history of CKD (08/24/2017 echo with an LVEF 60-65% no aortic stenosis), GERD, OSA on CPAP and others listed below, known to Dr. Carlean Purl, who was referred to me by Pleas Koch, NP for a complaint of nausea.    04/08/2017 colonoscopy Dr. Carlean Purl with 1 8 mm polyp in the transverse colon and otherwise normal.  Pathology showed an 8 mm tubular adenoma.  Recall placed for 2024.    08/16/2020 right upper quadrant ultrasound with hepatic steatosis.  Gallbladder sludge with no findings of acute cholecystitis.    08/16/2020 CT of the head without contrast with stable trace left-sided subdural hematoma and trace occipital subarachnoid hemorrhage, stable posterior midline scalp hematoma and/or laceration.    10/17/2020 office visit with PCP to discuss nausea, vomiting and loose stools.  Symptoms had started 10 days prior.  Describes some lower extremity edema at the same time as well [place] bathroom for diarrhea and "blacked out".  That time could not eat or drink or he would vomit up food.  Describes 10+ episodes a day of vomiting and 4-5 episodes of diarrhea.  Apparently admitted to the hospital in May for similar symptoms and was presumed to have gastritis.  CT negative.  He was given Zofran ODT 4 mg.    10/21/2020 BMP with a potassium of 3.4, creatinine normal.  GI pathogen panel negative.  10/17/2020 CBC with platelets minimally decreased at 117.    10/20/2020 patient reported to his PCP he was feeling better, but then on 10/28/2020 he had recurrence of nausea.    Today, the patient describes that back in May he started with his initial symptoms, it started with nausea and then violent vomiting and diarrhea.  He tells me he would vomit or have diarrhea every hour.  He noticed that around that time his legs were swollen as well and his hands would shake and he could not keep any food  down at all.  He became very dizzy and would shuffle when he walked and eventually fell and hit his head which landed him in the hospital as above with work-up as above.  They kept him in the hospital for 4 days on IV fluids and everything was better when he left.  He had no further nausea and vomiting.  Then about a month later he started with the same symptoms again.  He saw his PCP who started him on Zofran 4 mg taking every 8 hours and after a couple of days his symptoms resolved.  At that time his labs and stool studies were all negative.  Again about a month later he had all the symptoms return.  He restarted his Zofran which he had at home and his symptoms stopped within 2 days.  He tells me he has not had any problems within the past month but "we are coming up on a month again".  Patient is nervous that the symptoms will start back and very concerned as to why they are happening at all.      Denies fever, chills, any abdominal pain or changes in diet, no new medications.  Past Medical History:  Diagnosis Date   Cataract    Chronic gout    CKD (chronic kidney disease)    pt. denies   Erectile dysfunction    GERD (gastroesophageal reflux disease)  Herpes simplex    Hx of adenomatous polyp of colon 04/12/2017   Hyperlipidemia    pt. denies   Hypertension    pt. denies   OSA on CPAP    Sleep apnea    cpap   Testosterone deficiency     Past Surgical History:  Procedure Laterality Date   COLONOSCOPY     TONSILLECTOMY AND ADENOIDECTOMY  1960    Current Outpatient Medications  Medication Sig Dispense Refill   allopurinol (ZYLOPRIM) 300 MG tablet TAKE 1 TABLET (300 MG TOTAL) BY MOUTH DAILY. FOR GOUT PREVENTION. 90 tablet 2   amLODipine (NORVASC) 5 MG tablet Take 5 mg by mouth daily.     atorvastatin (LIPITOR) 20 MG tablet Take 1 tablet (20 mg total) by mouth daily. For cholesterol. 90 tablet 2   citalopram (CELEXA) 20 MG tablet TAKE 1 TABLET (20 MG TOTAL) BY MOUTH DAILY. FOR  ANXIETY AND DEPRESSION. 90 tablet 2   furosemide (LASIX) 20 MG tablet Take 20 mg by mouth 3 (three) times a week.     levothyroxine (SYNTHROID) 50 MCG tablet Take 1 tablet by mouth every morning on an empty stomach with water only.  No food or other medications for 30 minutes. (Patient taking differently: Take 50 mcg by mouth See admin instructions. Take 1 tablet by mouth every morning on an empty stomach with water only.  No food or other medications for 30 minutes.) 90 tablet 2   metoprolol succinate (TOPROL-XL) 25 MG 24 hr tablet Take 25 mg by mouth daily.     ondansetron (ZOFRAN ODT) 4 MG disintegrating tablet Take 1 tablet (4 mg total) by mouth every 8 (eight) hours as needed for nausea or vomiting. 20 tablet 0   tamsulosin (FLOMAX) 0.4 MG CAPS capsule Take 0.4 mg by mouth at bedtime.     testosterone cypionate (DEPOTESTOSTERONE CYPIONATE) 200 MG/ML injection Inject 100 mg into the muscle every 28 (twenty-eight) days.     No current facility-administered medications for this visit.    Allergies as of 11/26/2020   (No Known Allergies)    Family History  Problem Relation Age of Onset   Rectal cancer Mother    Colon polyps Neg Hx    Esophageal cancer Neg Hx    Colon cancer Neg Hx    Stomach cancer Neg Hx     Social History   Socioeconomic History   Marital status: Married    Spouse name: Not on file   Number of children: Not on file   Years of education: Not on file   Highest education level: Not on file  Occupational History   Not on file  Tobacco Use   Smoking status: Never    Passive exposure: Never   Smokeless tobacco: Never  Vaping Use   Vaping Use: Never used  Substance and Sexual Activity   Alcohol use: Yes    Alcohol/week: 2.0 standard drinks    Types: 2 Glasses of wine per week   Drug use: No   Sexual activity: Not Currently  Other Topics Concern   Not on file  Social History Narrative   Not on file   Social Determinants of Health   Financial Resource  Strain: Low Risk    Difficulty of Paying Living Expenses: Not hard at all  Food Insecurity: No Food Insecurity   Worried About Charity fundraiser in the Last Year: Never true   [place] in the Last Year: Never true  Transportation Needs: No  Transportation Needs   Lack of Transportation (Medical): No   Lack of Transportation (Non-Medical): No  Physical Activity: Sufficiently Active   Days of Exercise per Week: 4 days   Minutes of Exercise per Session: 60 min  Stress: No Stress Concern Present   Feeling of Stress : Not at all  Social Connections: Not on file  Intimate Partner Violence: Not At Risk   Fear of Current or Ex-Partner: No   Emotionally Abused: No   Physically Abused: No   Sexually Abused: No    Review of Systems:    Constitutional: No weight loss, fever or chills Skin: No rash  Cardiovascular: No chest pain Respiratory: No SOB  Gastrointestinal: See HPI and otherwise negative Genitourinary: No dysuria  Neurological: No headache, dizziness or syncope Musculoskeletal: No new muscle or joint pain Hematologic: No bleeding  Psychiatric: No history of depression or anxiety   Physical Exam:  Vital signs: BP 138/76   Pulse 83   Ht '5\' 8"'$  (1.727 m)   Wt 231 lb 3.2 oz (104.9 kg)   SpO2 99%   BMI 35.15 kg/m    Constitutional:   Pleasant Caucasian male appears to be in NAD, Well developed, Well nourished, alert and cooperative Head:  Normocephalic and atraumatic. Eyes:   PEERL, EOMI. No icterus. Conjunctiva pink. Ears:  Normal auditory acuity. Neck:  Supple Throat: Oral cavity and pharynx without inflammation, swelling or lesion.  Respiratory: Respirations even and unlabored. Lungs clear to auscultation bilaterally.   No wheezes, crackles, or rhonchi.  Cardiovascular: Normal S1, S2. No MRG. Regular rate and rhythm. No peripheral edema, cyanosis or pallor.  Gastrointestinal:  Soft, nondistended, nontender. No rebound or guarding. Normal bowel sounds. No  appreciable masses or hepatomegaly. Rectal:  Not performed.  Msk:  Symmetrical without gross deformities. Without edema, no deformity or joint abnormality.  Neurologic:  Alert and  oriented x4;  grossly normal neurologically.  Skin:   Dry and intact without significant lesions or rashes. Psychiatric: Demonstrates good judgement and reason without abnormal affect or behaviors.  RELEVANT LABS AND IMAGING: CBC    Component Value Date/Time   WBC 5.7 10/17/2020 1156   RBC 3.96 (L) 10/17/2020 1156   HGB 14.1 10/17/2020 1156   HCT 42.7 10/17/2020 1156   PLT 117.0 (L) 10/17/2020 1156   MCV 107.9 (H) 10/17/2020 1156   MCH 35.3 (H) 09/15/2020 1124   MCHC 33.0 10/17/2020 1156   RDW 16.0 (H) 10/17/2020 1156   LYMPHSABS 0.8 10/17/2020 1156   MONOABS 0.5 10/17/2020 1156   EOSABS 0.0 10/17/2020 1156   BASOSABS 0.0 10/17/2020 1156    CMP     Component Value Date/Time   NA 139 10/21/2020 1028   NA 143 09/13/2017 1248   K 3.4 (L) 10/21/2020 1028   CL 97 10/21/2020 1028   CO2 29 10/21/2020 1028   GLUCOSE 88 10/21/2020 1028   BUN 17 10/21/2020 1028   BUN 15 09/13/2017 1248   CREATININE 1.37 10/21/2020 1028   CREATININE 1.39 (H) 09/15/2020 1124   CREATININE 2.94 (H) 01/20/2018 1451   CALCIUM 9.2 10/21/2020 1028   PROT 7.1 10/17/2020 1156   ALBUMIN 4.1 10/17/2020 1156   AST 41 (H) 10/17/2020 1156   AST 26 09/15/2020 1124   ALT 41 10/17/2020 1156   ALT 25 09/15/2020 1124   ALKPHOS 79 10/17/2020 1156   BILITOT 1.6 (H) 10/17/2020 1156   BILITOT 1.1 09/15/2020 1124   GFRNONAA 54 (L) 09/15/2020 1124   GFRAA 52 (L) 09/13/2017  1248    Assessment: 1.  Nausea and vomiting: Once a month over the past 3 to 4 months, other than dehydration work-up so far has been negative, currently when symptoms restart they are controlled with Zofran, no heartburn or reflux; consider gastroparesis versus gastric outlet obstruction versus gastritis versus H. pylori versus gallbladder malfunction 2.  Diarrhea:  With above  Plan: 1.  Scheduled patient for an EGD with Dr. Tarri Glenn as she had availability sooner than Dr. Carlean Purl.  Patient is very much wanting to get this worked up and make sure everything is okay.  Patient was provided with a detailed list of risks for the procedure and he agrees to proceed.  Patient is appropriate for endoscopic procedure(s) in the ambulatory ([facility]) setting.  He will continue to follow with Dr. Carlean Purl is his primary GI physician after time procedure. 2.  For now recommend the patient uses Zofran 4 mg as prescribed.  He can use this every 6 hours if needed if symptoms start back.  Did ask him to try and keep a food journal/see if there are any certain things triggering symptoms. 3.  Discussed with patient he did have an ultrasound showing some sludge in his gallbladder, if EGD is negative would recommend further work-up with a HIDA scan versus CT of the abdomen pelvis. 4.  Patient to follow in clinic per recommendations from Dr. Tarri Glenn after time of procedure  Ellouise Newer, PA-C [facility] 11/26/2020, 9:49 AM  Cc: Pleas Koch, NP

## 2020-11-26 NOTE — Progress Notes (Signed)
Reviewed.  Janyla Biscoe L. Niah Heinle, MD, MPH  

## 2020-11-27 ENCOUNTER — Other Ambulatory Visit: Payer: Self-pay

## 2020-11-27 ENCOUNTER — Ambulatory Visit (AMBULATORY_SURGERY_CENTER): Payer: Medicare HMO | Admitting: Gastroenterology

## 2020-11-27 ENCOUNTER — Encounter: Payer: Self-pay | Admitting: Gastroenterology

## 2020-11-27 VITALS — BP 114/60 | HR 64 | Temp 97.1°F | Resp 16 | Ht 68.0 in | Wt 231.0 lb

## 2020-11-27 DIAGNOSIS — K319 Disease of stomach and duodenum, unspecified: Secondary | ICD-10-CM | POA: Diagnosis not present

## 2020-11-27 DIAGNOSIS — K259 Gastric ulcer, unspecified as acute or chronic, without hemorrhage or perforation: Secondary | ICD-10-CM

## 2020-11-27 DIAGNOSIS — R112 Nausea with vomiting, unspecified: Secondary | ICD-10-CM

## 2020-11-27 DIAGNOSIS — R197 Diarrhea, unspecified: Secondary | ICD-10-CM

## 2020-11-27 DIAGNOSIS — K2980 Duodenitis without bleeding: Secondary | ICD-10-CM | POA: Diagnosis not present

## 2020-11-27 DIAGNOSIS — K219 Gastro-esophageal reflux disease without esophagitis: Secondary | ICD-10-CM | POA: Diagnosis not present

## 2020-11-27 DIAGNOSIS — K3189 Other diseases of stomach and duodenum: Secondary | ICD-10-CM

## 2020-11-27 MED ORDER — PANTOPRAZOLE SODIUM 40 MG PO TBEC
40.0000 mg | DELAYED_RELEASE_TABLET | Freq: Two times a day (BID) | ORAL | 1 refills | Status: DC
Start: 2020-11-27 — End: 2021-02-11

## 2020-11-27 MED ORDER — SODIUM CHLORIDE 0.9 % IV SOLN: 500.0000 mL | Freq: Once | INTRAVENOUS | Status: DC

## 2020-11-27 NOTE — Op Note (Signed)
[facility] Patient Name: Devin Howell Procedure Date: 11/27/2020 3:10 PM MRN: [id number] Endoscopist: Thornton Park MD, MD Age: 70 Referring MD:  Date of Birth: [birth year] Gender: Male Account #: [id number] Procedure:                Upper GI endoscopy Indications:              Diarrhea, Nausea with vomiting Medicines:                Monitored Anesthesia Care Procedure:                Pre-Anesthesia Assessment:                           - Prior to the procedure, a History and Physical                            was performed, and patient medications and                            allergies were reviewed. The patient's tolerance of                            previous anesthesia was also reviewed. The risks                            and benefits of the procedure and the sedation                            options and risks were discussed with the patient.                            All questions were answered, and informed consent                            was obtained. Prior Anticoagulants: The patient has                            taken no previous anticoagulant or antiplatelet                            agents. ASA Grade Assessment: III - A patient with                            severe systemic disease. After reviewing the risks                            and benefits, the patient was deemed in                            satisfactory condition to undergo the procedure.                           After obtaining informed consent, the endoscope was  passed under direct vision. Throughout the                            procedure, the patient's blood pressure, pulse, and                            oxygen saturations were monitored continuously. The                            Endoscope was introduced through the mouth, and                            advanced to the third part of duodenum. The upper                            GI endoscopy was  accomplished without difficulty.                            The patient tolerated the procedure well. Scope In: Scope Out: Findings:                 The Z-line was irregular and was found 40 cm from                            the incisors. There is congestion, inflammation,                            and at least one small island. Biopsies were taken                            with a cold forceps for histology. Estimated blood                            loss was minimal.                           There is focal erythema on the great curve, linear                            erythema in the gastric body, and three                            non-bleeding cratered gastric ulcers with no                            stigmata of bleeding were found at the pylorus. The                            mucosa in the atrum has a slightly verrucous                            appearance. No heaped margins to the ulcers. The  largest lesion was 6 mm in largest dimension.                            Biopsies were taken from the ulcer margins, antrum,                            body, and fundus with a cold forceps for histology.                            Estimated blood loss was minimal.                           Patchy mildly erythematous mucosa was found in the                            duodenal bulb. Complications:            No immediate complications. Estimated blood loss:                            Minimal. Estimated Blood Loss:     Estimated blood loss was minimal. Impression:               - Z-line irregular, 40 cm from the incisors.                            Biopsied.                           - Non-bleeding gastric ulcers with no stigmata of                            bleeding. Biopsied.                           - Duodenitis. Recommendation:           - Patient has a contact number available for                            emergencies. The signs and symptoms of potential                             delayed complications were discussed with the                            patient. Return to normal activities tomorrow.                            Written discharge instructions were provided to the                            patient.                           - Resume previous diet.                           -  Continue present medications.                           - No aspirin, ibuprofen, naproxen, or other                            non-steroidal anti-inflammatory drugs.                           - Use Protonix (pantoprazole) 40 mg PO BID for 12                            weeks.                           - Await pathology results. I will review those                            results with Dr. Carlean Purl when they are available. Thornton Park MD, MD 11/27/2020 4:00:53 PM This report has been signed electronically.

## 2020-11-27 NOTE — Progress Notes (Signed)
To pacu, VSS. Report to [email or web address] 

## 2020-11-27 NOTE — Progress Notes (Signed)
Pt's states no medical or surgical changes since previsit or office visit. 

## 2020-11-27 NOTE — Patient Instructions (Addendum)
Resume previous medications.  Start Protonix twice a day for 12 weeks.    Avoid any NSAIDS (ie: advil, ibuprofren, aleve) Aspirin products.  Await pathology for final recommendations.  Handouts on findings given to patient.     YOU HAD AN ENDOSCOPIC PROCEDURE TODAY AT [facility]:   Refer to the procedure report that was given to you for any specific questions about what was found during the examination.  If the procedure report does not answer your questions, please call your gastroenterologist to clarify.  If you requested that your care partner not be given the details of your procedure findings, then the procedure report has been included in a sealed envelope for you to review at your convenience later.  YOU SHOULD EXPECT: Some feelings of bloating in the abdomen. Passage of more gas than usual.  Walking can help get rid of the air that was put into your GI tract during the procedure and reduce the bloating. If you had a lower endoscopy (such as a colonoscopy or flexible sigmoidoscopy) you may notice spotting of blood in your stool or on the toilet paper. If you underwent a bowel prep for your procedure, you may not have a normal bowel movement for a few days.  Please Note:  You might notice some irritation and congestion in your nose or some drainage.  This is from the oxygen used during your procedure.  There is no need for concern and it should clear up in a day or so.  SYMPTOMS TO REPORT IMMEDIATELY:   Following upper endoscopy (EGD)  Vomiting of blood or coffee ground material  New chest pain or pain under the shoulder blades  Painful or persistently difficult swallowing  New shortness of breath  Fever of 100F or higher  Black, tarry-looking stools  For urgent or emergent issues, a gastroenterologist can be reached at any hour by calling [phone]. Do not use MyChart messaging for urgent concerns.    DIET:  We do recommend a small meal at first, but then you may  proceed to your regular diet.  Drink plenty of fluids but you should avoid alcoholic beverages for 24 hours.  ACTIVITY:  You should plan to take it easy for the rest of today and you should NOT DRIVE or use heavy machinery until tomorrow (because of the sedation medicines used during the test).    FOLLOW UP: Our staff will call the number listed on your records 48-72 hours following your procedure to check on you and address any questions or concerns that you may have regarding the information given to you following your procedure. If we do not reach you, we will leave a message.  We will attempt to reach you two times.  During this call, we will ask if you have developed any symptoms of COVID 19. If you develop any symptoms (ie: fever, flu-like symptoms, shortness of breath, cough etc.) before then, please call [phone].  If you test positive for Covid 19 in the 2 weeks post procedure, please call and report this information to [place].    If any biopsies were taken you will be contacted by phone or by letter within the next 1-3 weeks.  Please call us at [phone] if you have not heard about the biopsies in 3 weeks.    SIGNATURES/CONFIDENTIALITY: You and/or your care partner have signed paperwork which will be entered into your electronic medical record.  These signatures attest to the fact that that the information above  on your After Visit Summary has been reviewed and is understood.  Full responsibility of the confidentiality of this discharge information lies with you and/or your care-partner.

## 2020-11-27 NOTE — Progress Notes (Signed)
Called to room to assist during endoscopic procedure.  Patient ID and intended procedure confirmed with present staff. Received instructions for my participation in the procedure from the performing physician.  

## 2020-11-27 NOTE — Progress Notes (Signed)
Referring Provider: Pleas Koch, NP Primary Care Physician:  Devin Koch, NP  Reason for Procedure:  Nausea, vomiting, diarrhea   IMPRESSION:  Nausea, vomiting, and diarrhea not explained by non-contrast CT abd/pelvis, ultrasound, or GI pathogen panel Gallbladder sludge on ultrasound   PLAN: EGD in the [facility] today   HPI: Devin Howell is a 70 y.o. male presents for diagnostic EGD. Previously known to Dr. Carlean Howell for colonoscopy 04/08/17. An 8 mm tubular adenoma was removed from the transverse colon and surveillance colonoscopy recommended in 2024.  Recently seen in the office by Devin Howell for nausea, vomiting, and diarrhea x 3-4 months.  Please see her 11/26/20 note for complete details. Three discrete episodes relieved only by Zofran. No identified exacerbation or relieving features. Symptoms not explained by CT abd/pelvis without contrast or abdominal ultrasound. Head CT showed a stable subdural hematoma and trace occipital subarachnoid hemorrhage. GI pathogen panel negative.    Past Medical History:  Diagnosis Date   Cataract    Chronic gout    CKD (chronic kidney disease)    pt. denies   Erectile dysfunction    GERD (gastroesophageal reflux disease)    Herpes simplex    Hx of adenomatous polyp of colon 04/12/2017   Hyperlipidemia    pt. denies   Hypertension    pt. denies   OSA on CPAP    Sleep apnea    cpap   Testosterone deficiency     Past Surgical History:  Procedure Laterality Date   COLONOSCOPY     TONSILLECTOMY AND ADENOIDECTOMY  1960    Current Outpatient Medications  Medication Sig Dispense Refill   allopurinol (ZYLOPRIM) 300 MG tablet TAKE 1 TABLET (300 MG TOTAL) BY MOUTH DAILY. FOR GOUT PREVENTION. 90 tablet 2   amLODipine (NORVASC) 5 MG tablet Take 5 mg by mouth daily.     atorvastatin (LIPITOR) 20 MG tablet Take 1 tablet (20 mg total) by mouth daily. For cholesterol. 90 tablet 2   citalopram (CELEXA) 20 MG tablet TAKE 1 TABLET  (20 MG TOTAL) BY MOUTH DAILY. FOR ANXIETY AND DEPRESSION. 90 tablet 2   levothyroxine (SYNTHROID) 50 MCG tablet Take 1 tablet by mouth every morning on an empty stomach with water only.  No food or other medications for 30 minutes. (Patient taking differently: Take 50 mcg by mouth See admin instructions. Take 1 tablet by mouth every morning on an empty stomach with water only.  No food or other medications for 30 minutes.) 90 tablet 2   metoprolol succinate (TOPROL-XL) 25 MG 24 hr tablet Take 25 mg by mouth daily.     tamsulosin (FLOMAX) 0.4 MG CAPS capsule Take 0.4 mg by mouth at bedtime.     testosterone cypionate (DEPOTESTOSTERONE CYPIONATE) 200 MG/ML injection Inject 100 mg into the muscle every 28 (twenty-eight) days.     furosemide (LASIX) 20 MG tablet Take 20 mg by mouth 3 (three) times a week.     ondansetron (ZOFRAN ODT) 4 MG disintegrating tablet Take 1 tablet (4 mg total) by mouth every 8 (eight) hours as needed for nausea or vomiting. 20 tablet 0   Current Facility-Administered Medications  Medication Dose Route Frequency Provider Last Rate Last Admin   0.9 %  sodium chloride infusion  500 mL Intravenous Once Thornton Park, MD        Allergies as of 11/27/2020   (No Known Allergies)    Family History  Problem Relation Age of Onset   Rectal cancer Mother  Colon polyps Neg Hx    Esophageal cancer Neg Hx    Colon cancer Neg Hx    Stomach cancer Neg Hx      Physical Exam: General:   Alert,  well-nourished, pleasant and cooperative in NAD Head:  Normocephalic and atraumatic. Eyes:  Sclera clear, no icterus.   Conjunctiva pink. Mouth:  No deformity or lesions.   Neck:  Supple; no masses or thyromegaly. Lungs:  Clear throughout to auscultation.   No wheezes. Heart:  Regular rate and rhythm; no murmurs. Abdomen:  Soft, non-tender, nondistended, normal bowel sounds, no rebound or guarding.  Msk:  Symmetrical. No boney deformities LAD: No inguinal or umbilical  LAD Extremities:  No clubbing or edema. Neurologic:  Alert and  oriented x4;  grossly nonfocal Skin:  No obvious rash or bruise. Psych:  Alert and cooperative. Normal mood and affect.      Devin Howell L. Tarri Glenn, MD, MPH 11/27/2020, 3:07 PM

## 2020-12-02 ENCOUNTER — Telehealth: Payer: Self-pay

## 2020-12-02 NOTE — Telephone Encounter (Signed)
First attempt follow up call to pt, no answer. 

## 2020-12-02 NOTE — Telephone Encounter (Signed)
  Follow up Call-  Call back number 11/27/2020  Post procedure Call Back phone  # [phone]  Permission to leave phone message Yes  Some recent data might be hidden     Patient questions:  Do you have a fever, pain , or abdominal swelling? No. Pain Score  0 *  Have you tolerated food without any problems? Yes.    Have you been able to return to your normal activities? Yes.    Do you have any questions about your discharge instructions: Diet   No. Medications  No. Follow up visit  No.  Do you have questions or concerns about your Care? No.  Actions: * If pain score is 4 or above: No action needed, pain <4.

## 2020-12-10 ENCOUNTER — Encounter: Payer: Self-pay | Admitting: Gastroenterology

## 2020-12-10 NOTE — Progress Notes (Signed)
Called and spoke to patient. Follow up appointment scheduled for 02-11-21 at 2:10pm.

## 2020-12-18 ENCOUNTER — Telehealth: Payer: Self-pay | Admitting: Gastroenterology

## 2020-12-18 DIAGNOSIS — R197 Diarrhea, unspecified: Secondary | ICD-10-CM

## 2020-12-18 DIAGNOSIS — R112 Nausea with vomiting, unspecified: Secondary | ICD-10-CM

## 2020-12-18 NOTE — Telephone Encounter (Signed)
Pt stated that he has three different bouts of vomiting, diarrhea, ankles swelling along with feeling tired and fatigue. Pt states the bouts will last about a week and then go away and will happen again several weeks later. States that he is taking the protonix as prescribed. Please advise

## 2020-12-18 NOTE — Telephone Encounter (Signed)
Patient called seeking help from a nurse states he is having issues with nausea and vomiting.

## 2020-12-19 ENCOUNTER — Telehealth: Payer: Self-pay

## 2020-12-19 ENCOUNTER — Other Ambulatory Visit: Payer: Self-pay | Admitting: Primary Care

## 2020-12-19 DIAGNOSIS — F419 Anxiety disorder, unspecified: Secondary | ICD-10-CM

## 2020-12-19 DIAGNOSIS — F32A Depression, unspecified: Secondary | ICD-10-CM

## 2020-12-19 NOTE — Telephone Encounter (Signed)
Imaging orders placed.   He and his wife are aware to come for x-ray if during business hours.   They are asked to keep the upcoming appointments with Anderson Malta and Dr. Carlean Purl

## 2020-12-19 NOTE — Telephone Encounter (Signed)
Noted. Agree with ED visit given severity of symptoms.

## 2020-12-19 NOTE — Telephone Encounter (Signed)
Patient denies visual changes, HA, or inner ear symptoms.  He states he has had 3 separate episodes of vomiting and diarrhea that proceeds to pedal edema and severe fatigue.  Currently he does not have nausea or vomiting, but has fatigue and ankles are swollen. He wants an office visit to return and discuss.  He is advised he needs to see his PCP about severe fatigue and his swollen ankles.  I scheduled him the next available follow up with Ellouise Newer, PA on 01/14/21 1:30

## 2020-12-19 NOTE — Telephone Encounter (Signed)
I am aware of visit with [facility] and EGD by Dr. Tarri Glenn.  It seems to me that this may have started after he fell and struck his head.  I can't say for sure but it is possible - ask if he is having any visual changes, inner ear type sxs or headaches during these spells.  At this point we have not turned up anything bad.

## 2020-12-19 NOTE — Telephone Encounter (Signed)
OK he had a Nov appt me also which I would leave on books  Please tell him to come in for abd xray if he has another attack - order DG abd 2 view dx nausea and vomiting - obviously if severely ill and needs ED go there  Anderson Malta - this is mysterious - I do not know how to correlate the edema with this  Next step may be CT enterography

## 2020-12-19 NOTE — Telephone Encounter (Signed)
Devin Howell nurse with access nurse calling pt was advised by access nurse to go to ED by EMS and pt is refusing. Devin Howell transferred Gore pts wife (DPR signed) to me; pts wife said pt sleeps all the time, not difficult to wake pt; pt is confused and disorientated when first wakes up. Now pt knows who he is and where he is; pt having difficulty walking, swollen legs, hands shaking uncontrollably, not eating but is drinking, no vomiting or diarrhea. This has happed x 4 and last time happened about one mth ago and in May 2022 was admitted to hospital but pts wife said was not given reason for pt to have these symptoms. No UTI symptoms or abd or back pain. Pt dx with uler 2 wks ago. Pt swife said pt did not want to go to ED. She let me talk with pt and I advised that Anda Kraft was out of office until 12/23/20 and pt said he would go to [facility] ED. I offered to call 911 but pt said no they would get to ED. Sending note to Gentry Fitz NP, Dr Diona Browner who is in office and [facility] CMA. Sendiing teams to [place].

## 2020-12-21 ENCOUNTER — Emergency Department (HOSPITAL_COMMUNITY): Payer: Medicare HMO

## 2020-12-21 ENCOUNTER — Other Ambulatory Visit: Payer: Self-pay

## 2020-12-21 ENCOUNTER — Encounter (HOSPITAL_COMMUNITY): Payer: Self-pay | Admitting: Emergency Medicine

## 2020-12-21 ENCOUNTER — Emergency Department (HOSPITAL_COMMUNITY)
Admission: EM | Admit: 2020-12-21 | Discharge: 2020-12-21 | Disposition: A | Discharge status: Home / Self Care | Payer: Medicare HMO | Attending: Emergency Medicine | Admitting: Emergency Medicine

## 2020-12-21 DIAGNOSIS — I129 Hypertensive chronic kidney disease with stage 1 through stage 4 chronic kidney disease, or unspecified chronic kidney disease: Secondary | ICD-10-CM | POA: Diagnosis not present

## 2020-12-21 DIAGNOSIS — Y907 Blood alcohol level of 200-239 mg/100 ml: Secondary | ICD-10-CM | POA: Diagnosis not present

## 2020-12-21 DIAGNOSIS — R600 Localized edema: Secondary | ICD-10-CM | POA: Insufficient documentation

## 2020-12-21 DIAGNOSIS — E039 Hypothyroidism, unspecified: Secondary | ICD-10-CM | POA: Diagnosis not present

## 2020-12-21 DIAGNOSIS — I700 Atherosclerosis of aorta: Secondary | ICD-10-CM | POA: Diagnosis not present

## 2020-12-21 DIAGNOSIS — F1010 Alcohol abuse, uncomplicated: Secondary | ICD-10-CM | POA: Insufficient documentation

## 2020-12-21 DIAGNOSIS — R251 Tremor, unspecified: Secondary | ICD-10-CM | POA: Insufficient documentation

## 2020-12-21 DIAGNOSIS — Z20822 Contact with and (suspected) exposure to covid-19: Secondary | ICD-10-CM | POA: Insufficient documentation

## 2020-12-21 DIAGNOSIS — N179 Acute kidney failure, unspecified: Secondary | ICD-10-CM | POA: Diagnosis not present

## 2020-12-21 DIAGNOSIS — I10 Essential (primary) hypertension: Secondary | ICD-10-CM | POA: Diagnosis not present

## 2020-12-21 DIAGNOSIS — J189 Pneumonia, unspecified organism: Secondary | ICD-10-CM | POA: Insufficient documentation

## 2020-12-21 DIAGNOSIS — N189 Chronic kidney disease, unspecified: Secondary | ICD-10-CM | POA: Diagnosis not present

## 2020-12-21 DIAGNOSIS — M7989 Other specified soft tissue disorders: Secondary | ICD-10-CM | POA: Diagnosis not present

## 2020-12-21 DIAGNOSIS — R69 Illness, unspecified: Secondary | ICD-10-CM | POA: Diagnosis not present

## 2020-12-21 DIAGNOSIS — G319 Degenerative disease of nervous system, unspecified: Secondary | ICD-10-CM | POA: Diagnosis not present

## 2020-12-21 DIAGNOSIS — Z79899 Other long term (current) drug therapy: Secondary | ICD-10-CM | POA: Diagnosis not present

## 2020-12-21 DIAGNOSIS — R2689 Other abnormalities of gait and mobility: Secondary | ICD-10-CM | POA: Diagnosis not present

## 2020-12-21 LAB — URINALYSIS, ROUTINE W REFLEX MICROSCOPIC
Bacteria, UA: NONE SEEN
Bilirubin Urine: NEGATIVE
Glucose, UA: NEGATIVE mg/dL
Ketones, ur: NEGATIVE mg/dL
Leukocytes,Ua: NEGATIVE
Nitrite: NEGATIVE
Protein, ur: 30 mg/dL — AB
Specific Gravity, Urine: 1.011 (ref 1.005–1.030)
pH: 5 (ref 5.0–8.0)

## 2020-12-21 LAB — COMPREHENSIVE METABOLIC PANEL
ALT: 33 U/L (ref 0–44)
AST: 33 U/L (ref 15–41)
Albumin: 3.8 g/dL (ref 3.5–5.0)
Alkaline Phosphatase: 86 U/L (ref 38–126)
Anion gap: 16 — ABNORMAL HIGH (ref 5–15)
BUN: 37 mg/dL — ABNORMAL HIGH (ref 8–23)
CO2: 26 mmol/L (ref 22–32)
Calcium: 9.1 mg/dL (ref 8.9–10.3)
Chloride: 97 mmol/L — ABNORMAL LOW (ref 98–111)
Creatinine, Ser: 2.26 mg/dL — ABNORMAL HIGH (ref 0.61–1.24)
GFR, Estimated: 30 mL/min — ABNORMAL LOW (ref >60)
Glucose, Bld: 98 mg/dL (ref 70–99)
Potassium: 3.6 mmol/L (ref 3.5–5.1)
Sodium: 139 mmol/L (ref 135–145)
Total Bilirubin: 0.5 mg/dL (ref 0.3–1.2)
Total Protein: 7 g/dL (ref 6.5–8.1)

## 2020-12-21 LAB — BASIC METABOLIC PANEL
Anion gap: 13 (ref 5–15)
BUN: 33 mg/dL — ABNORMAL HIGH (ref 8–23)
CO2: 26 mmol/L (ref 22–32)
Calcium: 8.6 mg/dL — ABNORMAL LOW (ref 8.9–10.3)
Chloride: 101 mmol/L (ref 98–111)
Creatinine, Ser: 1.84 mg/dL — ABNORMAL HIGH (ref 0.61–1.24)
GFR, Estimated: 39 mL/min — ABNORMAL LOW (ref >60)
Glucose, Bld: 79 mg/dL (ref 70–99)
Potassium: 3.7 mmol/L (ref 3.5–5.1)
Sodium: 140 mmol/L (ref 135–145)

## 2020-12-21 LAB — CBC WITH DIFFERENTIAL/PLATELET
Abs Immature Granulocytes: 0.13 10*3/uL — ABNORMAL HIGH (ref 0.00–0.07)
Basophils Absolute: 0.1 10*3/uL (ref 0.0–0.1)
Basophils Relative: 2 %
Eosinophils Absolute: 0.2 10*3/uL (ref 0.0–0.5)
Eosinophils Relative: 3 %
HCT: 39.9 % (ref 39.0–52.0)
Hemoglobin: 13.6 g/dL (ref 13.0–17.0)
Immature Granulocytes: 2 %
Lymphocytes Relative: 30 %
Lymphs Abs: 1.8 10*3/uL (ref 0.7–4.0)
MCH: 35.1 pg — ABNORMAL HIGH (ref 26.0–34.0)
MCHC: 34.1 g/dL (ref 30.0–36.0)
MCV: 103.1 fL — ABNORMAL HIGH (ref 80.0–100.0)
Monocytes Absolute: 0.4 10*3/uL (ref 0.1–1.0)
Monocytes Relative: 6 %
Neutro Abs: 3.4 10*3/uL (ref 1.7–7.7)
Neutrophils Relative %: 57 %
Platelets: 181 10*3/uL (ref 150–400)
RBC: 3.87 MIL/uL — ABNORMAL LOW (ref 4.22–5.81)
RDW: 14.1 % (ref 11.5–15.5)
WBC: 5.9 10*3/uL (ref 4.0–10.5)
nRBC: 0 % (ref 0.0–0.2)

## 2020-12-21 LAB — RESP PANEL BY RT-PCR (FLU A&B, COVID) ARPGX2
Influenza A by PCR: NEGATIVE
Influenza B by PCR: NEGATIVE
SARS Coronavirus 2 by RT PCR: NEGATIVE

## 2020-12-21 LAB — PROTIME-INR
INR: 0.9 (ref 0.8–1.2)
Prothrombin Time: 12.3 seconds (ref 11.4–15.2)

## 2020-12-21 LAB — ETHANOL: Alcohol, Ethyl (B): 212 mg/dL — ABNORMAL HIGH (ref <10)

## 2020-12-21 LAB — APTT: aPTT: 29 seconds (ref 24–36)

## 2020-12-21 LAB — RAPID URINE DRUG SCREEN, HOSP PERFORMED
Amphetamines: NOT DETECTED
Barbiturates: NOT DETECTED
Benzodiazepines: NOT DETECTED
Cocaine: NOT DETECTED
Opiates: NOT DETECTED
Tetrahydrocannabinol: NOT DETECTED

## 2020-12-21 LAB — LIPASE, BLOOD: Lipase: 59 U/L — ABNORMAL HIGH (ref 11–51)

## 2020-12-21 LAB — BRAIN NATRIURETIC PEPTIDE: B Natriuretic Peptide: 171.4 pg/mL — ABNORMAL HIGH (ref 0.0–100.0)

## 2020-12-21 MED ORDER — THIAMINE HCL 100 MG/ML IJ SOLN
100.0000 mg | Freq: Every day | INTRAMUSCULAR | Status: DC
  Administered 2020-12-21: 100 mg via INTRAVENOUS
  Filled 2020-12-21: qty 2

## 2020-12-21 MED ORDER — THIAMINE HCL 100 MG PO TABS: 100.0000 mg | ORAL_TABLET | Freq: Every day | ORAL | Status: DC

## 2020-12-21 MED ORDER — SODIUM CHLORIDE 0.9 % IV BOLUS
500.0000 mL | Freq: Once | INTRAVENOUS | Status: AC
  Administered 2020-12-21: 500 mL via INTRAVENOUS

## 2020-12-21 MED ORDER — LORAZEPAM 2 MG/ML IJ SOLN
0.0000 mg | Freq: Four times a day (QID) | INTRAMUSCULAR | Status: DC
  Administered 2020-12-21: 2 mg via INTRAVENOUS
  Filled 2020-12-21: qty 1

## 2020-12-21 MED ORDER — SODIUM CHLORIDE 0.9 % IV SOLN: INTRAVENOUS | Status: DC

## 2020-12-21 MED ORDER — LORAZEPAM 1 MG PO TABS: 0.0000 mg | ORAL_TABLET | Freq: Two times a day (BID) | ORAL | Status: DC

## 2020-12-21 MED ORDER — LORAZEPAM 2 MG/ML IJ SOLN: 0.0000 mg | Freq: Two times a day (BID) | INTRAMUSCULAR | Status: DC

## 2020-12-21 MED ORDER — LORAZEPAM 1 MG PO TABS: 1.0000 mg | ORAL_TABLET | Freq: Three times a day (TID) | ORAL | 0 refills | Status: DC | PRN

## 2020-12-21 MED ORDER — LORAZEPAM 1 MG PO TABS: 0.0000 mg | ORAL_TABLET | Freq: Four times a day (QID) | ORAL | Status: DC

## 2020-12-21 NOTE — ED Notes (Signed)
Pt noted to be flush and tremors. Tells this RN he drinks "3 glasses of liquor a day" reports last drink was yesterday evening This RN notified EDP Zackowski MD.

## 2020-12-21 NOTE — Telephone Encounter (Signed)
Chart review completed and he is currently at [facility] now. He didn't go until today (12/21/20). Await additional notes.

## 2020-12-21 NOTE — ED Triage Notes (Signed)
Pt and family member reports swelling to bilateral legs that started on Wednesday/Thursday.  Reports increased sleeping, shuffled gait, R hand shaking, difficulty articulating, and "white tissue-like particles" in stool.  No arm drift.

## 2020-12-21 NOTE — ED Provider Notes (Signed)
Emergency Medicine Provider Triage Evaluation Note  Devin Howell , a 70 y.o. male  was evaluated in triage.  Pt complains of 1 week history of gradually worsening hand shaking, leg swelling, trouble articulating his words, shuffling gait.  Yesterday noticed some "white flecks" in his feces.  Wife states that this is all happened to him in the past, about 4 times within the past year.  They have never been able to tell him why his symptoms have occurred.  Had an endoscopy 2 weeks ago and was told that he had "small ulcers."  Denies any bloody stools or vomiting, chest pain.  Review of Systems  Positive: Shuffling gait, leg swelling, tremors Negative: Chest pain, shortness of breath  Physical Exam  BP 115/60   Pulse 71   Temp 98.7 F (37.1 C) (Oral)   Resp 18   Ht 5\' 8"  (1.727 m)   Wt 104.3 kg   SpO2 95%   BMI 34.97 kg/m  Gen:   Awake, no distress   Resp:  Normal effort  MSK:   Moves extremities without difficulty  Other:  Edema noted to bilateral lower extremities.  Strength 5/5 in bilateral upper and lower extremities.  Symmetric tongue protrusion  Medical Decision Making  Medically screening exam initiated at 10:48 AM.  Appropriate orders placed.  Devin Howell was informed that the remainder of the evaluation will be completed by another provider, this initial triage assessment does not replace that evaluation, and the importance of remaining in the ED until their evaluation is complete.  Work-up initiated   Delia Heady, PA-C 12/21/20 1049    Isla Pence, MD 12/21/20 1252

## 2020-12-21 NOTE — Discharge Instructions (Addendum)
Repeat kidney function showed significant improvement with the fluids.  Continue to hydrate yourself.  Contact your primary care provider for recheck or either your nephrologist.  Also follow-up with cardiology regarding the lower extremity swelling.  Will give referral information to cardiology above.  Return for any new or worse symptoms.  Take the Ativan as needed for the tremors.

## 2020-12-21 NOTE — ED Notes (Signed)
Pt transported to CT ?

## 2020-12-21 NOTE — ED Notes (Signed)
Pt transported to MRI 

## 2020-12-21 NOTE — ED Provider Notes (Addendum)
[facility] EMERGENCY DEPARTMENT Provider Note   CSN: [id number] Arrival date & time: 12/21/20  1031     History Chief Complaint  Patient presents with   Leg Swelling   abnormal gait   Tremors    Devin Howell is a 70 y.o. male.  Patient brought in by POV.  Accompanied by his wife.  Main concerns are been things that had started 4 days ago.  Patient had some shuffling with his walking.  On Friday he had speech and bilateral hand tremors.  Patient's speech was almost like stuttering.  Patient's had leg swelling on and off long-term.  Last seen by cardiology about 5 years ago.  Patient is on Lasix.  Patient's primary care provider is [place].  Patient denies any chest pain or any shortness of breath no abdominal pain no nausea vomiting or diarrhea there was 1 episode of vomiting about 7 days ago but but none since.  Patient's wife says leg swellings long-term but what is definitely new or the events that started 4 days ago and particularly the speech and the hand shaking that started on Friday.  It has continued.  Patient was admitted back in May 2022 following a fall resulting in a subdural hematoma and trace occipital subarachnoid hemorrhage.  Patient's last head CT in May showed stable left-sided subdural hematoma and the trace occipital subarachnoid hemorrhage.  Patient has not had an MRI of the brain.      Past Medical History:  Diagnosis Date   Cataract    Chronic gout    CKD (chronic kidney disease)    pt. denies   Erectile dysfunction    GERD (gastroesophageal reflux disease)    Herpes simplex    Hx of adenomatous polyp of colon 04/12/2017   Hyperlipidemia    pt. denies   Hypertension    pt. denies   OSA on CPAP    Sleep apnea    cpap   Testosterone deficiency     Patient Active Problem List   Diagnosis Date Noted   Thrombocytopenia ([place]) 09/08/2020   Nausea vomiting and diarrhea 08/15/2020   AKI (acute kidney injury) ([place]) 08/15/2020    Subdural hematoma ([facility])    Subarachnoid hematoma (H[facility])    LFT elevation    Hypothyroidism 02/13/2019   Dizziness 11/16/2017   Hx of adenomatous polyp of colon 04/12/2017   Anxiety and depression 03/01/2017   Erectile dysfunction 12/13/2016   Preventative health care 12/13/2016   Major depressive disorder 12/13/2016   Hiccups 06/10/2016   Chronic gout without tophus 04/20/2016   Genital herpes simplex 04/20/2016   Testosterone deficiency 04/20/2016   CKD (chronic kidney disease) 04/20/2016   Essential hypertension 04/20/2016   Hyperlipidemia 04/20/2016    Past Surgical History:  Procedure Laterality Date   COLONOSCOPY     TONSILLECTOMY AND ADENOIDECTOMY  1960       Family History  Problem Relation Age of Onset   Rectal cancer Mother    Colon polyps Neg Hx    Esophageal cancer Neg Hx    Colon cancer Neg Hx    Stomach cancer Neg Hx     Social History   Tobacco Use   Smoking status: Never    Passive exposure: Never   Smokeless tobacco: Never  Vaping Use   Vaping Use: Never used  Substance Use Topics   Alcohol use: Yes    Alcohol/week: 2.0 standard drinks    Types: 2 Glasses of wine per week  Drug use: No    Home Medications Prior to Admission medications   Medication Sig Start Date End Date Taking? Authorizing Provider  allopurinol (ZYLOPRIM) 300 MG tablet TAKE 1 TABLET (300 MG TOTAL) BY MOUTH DAILY. FOR GOUT PREVENTION. 06/15/20   Pleas Koch, NP  amLODipine (NORVASC) 5 MG tablet Take 5 mg by mouth daily.    [provider]  atorvastatin (LIPITOR) 20 MG tablet Take 1 tablet (20 mg total) by mouth daily. For cholesterol. 06/10/20   Pleas Koch, NP  citalopram (CELEXA) 20 MG tablet TAKE 1 TABLET (20 MG TOTAL) BY MOUTH DAILY. FOR ANXIETY AND DEPRESSION. 12/21/20   Pleas Koch, NP  furosemide (LASIX) 20 MG tablet Take 20 mg by mouth 3 (three) times a week. 09/22/20   [provider]  levothyroxine (SYNTHROID) 50 MCG tablet  Take 1 tablet by mouth every morning on an empty stomach with water only.  No food or other medications for 30 minutes. Patient taking differently: Take 50 mcg by mouth See admin instructions. Take 1 tablet by mouth every morning on an empty stomach with water only.  No food or other medications for 30 minutes. 05/08/20   Pleas Koch, NP  metoprolol succinate (TOPROL-XL) 25 MG 24 hr tablet Take 25 mg by mouth daily.    [provider]  ondansetron (ZOFRAN ODT) 4 MG disintegrating tablet Take 1 tablet (4 mg total) by mouth every 8 (eight) hours as needed for nausea or vomiting. 10/17/20   Pleas Koch, NP  pantoprazole (PROTONIX) 40 MG tablet Take 1 tablet (40 mg total) by mouth 2 (two) times daily. 11/27/20   Thornton Park, MD  tamsulosin (FLOMAX) 0.4 MG CAPS capsule Take 0.4 mg by mouth at bedtime. 12/24/19   [provider]  testosterone cypionate (DEPOTESTOSTERONE CYPIONATE) 200 MG/ML injection Inject 100 mg into the muscle every 28 (twenty-eight) days. 01/10/19   [provider]    Allergies    Patient has no known allergies.  Review of Systems   Review of Systems  Constitutional:  Positive for fatigue. Negative for chills and fever.  HENT:  Negative for ear pain and sore throat.   Eyes:  Negative for pain and visual disturbance.  Respiratory:  Negative for cough and shortness of breath.   Cardiovascular:  Positive for leg swelling. Negative for chest pain and palpitations.  Gastrointestinal:  Negative for abdominal pain and vomiting.  Genitourinary:  Negative for dysuria and hematuria.  Musculoskeletal:  Negative for arthralgias and back pain.  Skin:  Negative for color change and rash.  Neurological:  Positive for tremors and speech difficulty. Negative for seizures, syncope, facial asymmetry, numbness and headaches.  All other systems reviewed and are negative.  Physical Exam Updated Vital Signs BP 136/71   Pulse 80   Temp 98.7 F (37.1 C)  (Oral)   Resp 15   Ht 1.727 m (5\' 8" )   Wt 104.3 kg   SpO2 92%   BMI 34.97 kg/m   Physical Exam Vitals and nursing note reviewed.  Constitutional:      Appearance: Normal appearance. He is well-developed.  HENT:     Head: Normocephalic and atraumatic.     Mouth/Throat:     Mouth: Mucous membranes are dry.     Comments: Mucous membranes slightly dry. Eyes:     Extraocular Movements: Extraocular movements intact.     Conjunctiva/sclera: Conjunctivae normal.     Pupils: Pupils are equal, round, and reactive to light.  Cardiovascular:     Rate and Rhythm: Normal rate and regular rhythm.     Heart sounds: No murmur heard. Pulmonary:     Effort: Pulmonary effort is normal. No respiratory distress.     Breath sounds: Normal breath sounds.  Abdominal:     General: There is no distension.     Palpations: Abdomen is soft.     Tenderness: There is no abdominal tenderness.  Musculoskeletal:     Cervical back: Normal range of motion and neck supple.     Right lower leg: Edema present.     Left lower leg: Edema present.     Comments: Lower extremity edema not involving the thighs.  No chronic skin changes.  Skin:    General: Skin is warm and dry.     Capillary Refill: Capillary refill takes less than 2 seconds.  Neurological:     Mental Status: He is alert.     Cranial Nerves: Cranial nerve deficit present.     Sensory: No sensory deficit.     Motor: No weakness.     Coordination: Coordination normal.     Comments: Patient with a stuttering type speech pattern.  Had bilateral tremors to both hands.    ED Results / Procedures / Treatments   Labs (all labs ordered are listed, but only abnormal results are displayed) Labs Reviewed  COMPREHENSIVE METABOLIC PANEL - Abnormal; Notable for the following components:      Result Value   Chloride 97 (*)    BUN 37 (*)    Creatinine, Ser 2.26 (*)    GFR, Estimated 30 (*)    Anion gap 16 (*)    All other components within normal limits   LIPASE, BLOOD - Abnormal; Notable for the following components:   Lipase 59 (*)    All other components within normal limits  CBC WITH DIFFERENTIAL/PLATELET - Abnormal; Notable for the following components:   RBC 3.87 (*)    MCV 103.1 (*)    MCH 35.1 (*)    Abs Immature Granulocytes 0.13 (*)    All other components within normal limits  URINALYSIS, ROUTINE W REFLEX MICROSCOPIC - Abnormal; Notable for the following components:   Hgb urine dipstick SMALL (*)    Protein, ur 30 (*)    All other components within normal limits  BRAIN NATRIURETIC PEPTIDE - Abnormal; Notable for the following components:   B Natriuretic Peptide 171.4 (*)    All other components within normal limits  ETHANOL - Abnormal; Notable for the following components:   Alcohol, Ethyl (B) 212 (*)    All other components within normal limits  RESP PANEL BY RT-PCR (FLU A&B, COVID) ARPGX2  PROTIME-INR  APTT  RAPID URINE DRUG SCREEN, HOSP PERFORMED  BASIC METABOLIC PANEL    EKG EKG Interpretation  Date/Time:  Sunday December 21 2020 13:07:23 EDT Ventricular Rate:  69 PR Interval:  156 QRS Duration: 97 QT Interval:  436 QTC Calculation: 468 R Axis:   4 Text Interpretation: Sinus rhythm Low voltage, precordial leads Consider anterior infarct Borderline T abnormalities, inferior leads Confirmed by Fredia Sorrow [phone]) on 12/21/2020 1:41:31 PM  Radiology DG Chest 2 View  Result Date: 12/21/2020 CLINICAL DATA:  Leg swelling.  Abnormal gait and tremors for 1 day. EXAM: CHEST - 2 VIEW COMPARISON:  08/15/2020 and older exams. FINDINGS: Cardiac silhouette is normal in size. No mediastinal or hilar masses. No evidence of adenopathy. Clear lungs.  No pleural effusion or pneumothorax. Skeletal structures are intact.  IMPRESSION: No active cardiopulmonary disease. Electronically Signed   By: Lajean Manes M.D.   On: 12/21/2020 11:09   CT HEAD WO CONTRAST  Result Date: 12/21/2020 CLINICAL DATA:  Gait difficulty. EXAM:  CT HEAD WITHOUT CONTRAST TECHNIQUE: Contiguous axial images were obtained from the base of the skull through the vertex without intravenous contrast. COMPARISON:  Aug 16, 2020. FINDINGS: Brain: Minimal diffuse cortical atrophy is noted. No mass effect or midline shift is noted. Ventricular size is within normal limits. There is no evidence of mass lesion, hemorrhage or acute infarction. Vascular: No hyperdense vessel or unexpected calcification. Skull: Normal. Negative for fracture or focal lesion. Sinuses/Orbits: No acute finding. Other: None. IMPRESSION: No acute intracranial abnormality seen. Electronically Signed   By: Marijo Conception M.D.   On: 12/21/2020 14:49   CT Chest Wo Contrast  Result Date: 12/21/2020 CLINICAL DATA:  Pneumonia, leg swelling EXAM: CT CHEST WITHOUT CONTRAST TECHNIQUE: Multidetector CT imaging of the chest was performed following the standard protocol without IV contrast. COMPARISON:  CT abdomen pelvis, 05/29/2004 FINDINGS: Cardiovascular: Aortic atherosclerosis. Normal heart size. Three-vessel coronary artery calcifications. No pericardial effusion. Mediastinum/Nodes: No enlarged mediastinal, hilar, or axillary lymph nodes. Thyroid gland, trachea, and esophagus demonstrate no significant findings. Lungs/Pleura: Dependent bibasilar scarring. No pleural effusion or pneumothorax. Upper Abdomen: No acute abnormality. Hepatic steatosis. Hyperdense lesion of the left lobe of the liver, not significantly changed compared to prior examinations dating back to 2016, and most likely a benign, incidental hemangioma or focal nodular hyperplasia (series 2, image 111). Musculoskeletal: No chest wall mass or suspicious bone lesions identified. IMPRESSION: 1. No acute noncontrast CT findings of the chest. 2. Dependent bibasilar scarring. 3. Hepatic steatosis. 4. Hyperdense lesion of the left lobe of the liver, not significantly changed compared to prior examinations dating back to 2016, and most likely  a benign, incidental hemangioma or focal nodular hyperplasia. No further follow-up or characterization is required. 5. Coronary artery disease. Aortic Atherosclerosis (ICD10-I70.0). Electronically Signed   By: Eddie Candle M.D.   On: 12/21/2020 15:01   MR Brain Wo Contrast (neuro protocol)  Result Date: 12/21/2020 CLINICAL DATA:  Neuro deficit, acute, stroke suspected. EXAM: MRI HEAD WITHOUT CONTRAST TECHNIQUE: Multiplanar, multiecho pulse sequences of the brain and surrounding structures were obtained without intravenous contrast. COMPARISON:  Prior head CT examinations 12/21/2020 and earlier. FINDINGS: Brain: Mild intermittent motion degradation. Mild-to-moderate generalized cerebral atrophy. Comparatively mild cerebellar atrophy. Minimal periventricular T2/FLAIR hyperintense signal abnormality within the cerebral white matter, nonspecific but compatible with chronic small vessel ischemic disease. Chronic microhemorrhages within the left parietal lobe and corpus callosum genu. SWI signal loss overlying portions of the left, and possibly right, frontal lobes suggesting chronic hemosiderin deposition from remote subarachnoid hemorrhage. There is no acute infarct. No evidence of an intracranial mass. No extra-axial fluid collection. No midline shift. Partially empty sella turcica, a nonspecific finding. Vascular: Maintained flow voids within the proximal large arterial vessels. Skull and upper cervical spine: No focal suspicious marrow lesion. Sinuses/Orbits: Visualized orbits show no acute finding. Bilateral lens replacements. Trace bilateral ethmoid sinus mucosal thickening. Other: Trace fluid within the bilateral mastoid air cells. IMPRESSION: Mildly motion degraded exam. No evidence of acute intracranial abnormality. Minimal chronic small-vessel ischemic changes within the periventricular white matter. Mild-to-moderate generalized cerebral atrophy. Comparatively mild cerebellar atrophy. SWI signal loss  overlying portions of the left, and possibly right, frontal lobes suggesting chronic hemosiderin deposition from remote subarachnoid hemorrhage. Minimal bilateral ethmoid sinus mucosal thickening. Trace fluid within the bilateral  mastoid air cells. Electronically Signed   By: Kellie Simmering D.O.   On: 12/21/2020 17:03    Procedures Procedures   Medications Ordered in ED Medications  LORazepam (ATIVAN) injection 0-4 mg (2 mg Intravenous Given 12/21/20 1705)    Or  LORazepam (ATIVAN) tablet 0-4 mg ( Oral See Alternative 12/21/20 1705)  LORazepam (ATIVAN) injection 0-4 mg (has no administration in time range)    Or  LORazepam (ATIVAN) tablet 0-4 mg (has no administration in time range)  thiamine tablet 100 mg ( Oral See Alternative 12/21/20 1704)    Or  thiamine (B-1) injection 100 mg (100 mg Intravenous Given 12/21/20 1704)  0.9 %  sodium chloride infusion (has no administration in time range)  sodium chloride 0.9 % bolus 500 mL (500 mLs Intravenous New Bag/Given 12/21/20 1703)    ED Course  I have reviewed the triage vital signs and the nursing notes.  Pertinent labs & imaging results that were available during my care of the patient were reviewed by me and considered in my medical decision making (see chart for details).    MDM Rules/Calculators/A&P                         CRITICAL CARE Performed by: Fredia Sorrow Total critical care time: 35 minutes Critical care time was exclusive of separately billable procedures and treating other patients. Critical care was necessary to treat or prevent imminent or life-threatening deterioration. Critical care was time spent personally by me on the following activities: development of treatment plan with patient and/or surrogate as well as nursing, discussions with consultants, evaluation of patient's response to treatment, examination of patient, obtaining history from patient or surrogate, ordering and performing treatments and interventions,  ordering and review of laboratory studies, ordering and review of radiographic studies, pulse oximetry and re-evaluation of patient's condition.   Early work-up here showed that he had a significant change in his BUN and creatinine and GFR.  BUN was 37 creatinine 2.26 for a GFR 30.  But his electrolytes sodium and potassium normal chloride slightly down at 97.  Blood sugar good.  Liver function tests normal.  Lipase slightly elevated at 59.  Patient has no abdominal tenderness.  CBC no leukocytosis hemoglobin 13.6.  BNP elevated 171.  Chest x-ray without any acute findings.  CT chest without contrast. No evidence of any acute findings and edema.  Urinalysis negative for urinary tract infection.  CT head without evidence of any bleed or any residual findings from the subdural and subarachnoid hemorrhage from back in May.  MRI was done because of symptoms.  MRI had no acute findings.  Patient's blood alcohol level came back significantly elevated at 212.  INR was normal.  Urine drug screen negative.  Based on the high blood alcohol level and the tremors patient was given Ativan.  And patient started on CIWA protocol.  That resolved all of the stuttering and the hand tremors.  Patient upon arrival was not tachycardic currently not tachycardic.  Still has evidence of acute kidney injury.  We will go ahead and repeat BMP to see if creatinine is improving.  Because it may be after the IV fluids.  Patient is not having any significant fluid loss and that he has no vomiting or diarrhea.  If it showing signs of improvement he may be be discharged home.  Feel that a lot of his symptoms are secondary to alcohol abuse.  Patient is stable for discharge home  will discharge with Ativan.  And have him follow-up with primary care provider to have his kidney function rechecked or follow-up with his nephrologist.   Final Clinical Impression(s) / ED Diagnoses Final diagnoses:  AKI (acute kidney injury) ([place])  Alcohol abuse     Rx / DC Orders ED Discharge Orders     None        Fredia Sorrow, MD 12/21/20 1927   Repeat BMP after some IV fluids showed a creatinine with an improvement to 1.84.  Brain the GFR up to 39.  Since patient is having no fluid loss I think he can be discharged home hydrate follow-up with his doctor and/or his nephrologist.  We will give patient referral to cardiology because of the bilateral leg swelling for further evaluation.   Fredia Sorrow, MD 12/21/20 2030

## 2020-12-21 NOTE — ED Notes (Signed)
Pt d/c home per MD order. Discharge summary reviewed with pt, pt verbalizes understanding. Pt wife is discharge ride home. No s/s of acute distress noted at discharge,

## 2020-12-21 NOTE — ED Notes (Signed)
Blue top save tube sent-DR

## 2020-12-24 ENCOUNTER — Other Ambulatory Visit: Payer: Self-pay

## 2020-12-24 ENCOUNTER — Ambulatory Visit: Payer: Medicare HMO | Admitting: Cardiology

## 2020-12-24 ENCOUNTER — Ambulatory Visit (INDEPENDENT_AMBULATORY_CARE_PROVIDER_SITE_OTHER): Payer: Medicare HMO | Admitting: Primary Care

## 2020-12-24 ENCOUNTER — Ambulatory Visit: Payer: Medicare HMO | Admitting: Primary Care

## 2020-12-24 ENCOUNTER — Encounter: Payer: Self-pay | Admitting: Primary Care

## 2020-12-24 VITALS — BP 130/70 | HR 72 | Temp 97.3°F | Ht 68.0 in | Wt 231.6 lb

## 2020-12-24 DIAGNOSIS — R251 Tremor, unspecified: Secondary | ICD-10-CM

## 2020-12-24 DIAGNOSIS — I10 Essential (primary) hypertension: Secondary | ICD-10-CM | POA: Diagnosis not present

## 2020-12-24 DIAGNOSIS — R197 Diarrhea, unspecified: Secondary | ICD-10-CM | POA: Diagnosis not present

## 2020-12-24 DIAGNOSIS — R269 Unspecified abnormalities of gait and mobility: Secondary | ICD-10-CM | POA: Insufficient documentation

## 2020-12-24 DIAGNOSIS — N1831 Chronic kidney disease, stage 3a: Secondary | ICD-10-CM

## 2020-12-24 DIAGNOSIS — R600 Localized edema: Secondary | ICD-10-CM | POA: Diagnosis not present

## 2020-12-24 DIAGNOSIS — R112 Nausea with vomiting, unspecified: Secondary | ICD-10-CM

## 2020-12-24 DIAGNOSIS — R066 Hiccough: Secondary | ICD-10-CM

## 2020-12-24 HISTORY — DX: Unspecified abnormalities of gait and mobility: R26.9

## 2020-12-24 LAB — BASIC METABOLIC PANEL
BUN: 24 mg/dL — ABNORMAL HIGH (ref 6–23)
CO2: 35 mEq/L — ABNORMAL HIGH (ref 19–32)
Calcium: 9.9 mg/dL (ref 8.4–10.5)
Chloride: 92 mEq/L — ABNORMAL LOW (ref 96–112)
Creatinine, Ser: 1.73 mg/dL — ABNORMAL HIGH (ref 0.40–1.50)
GFR: 39.17 mL/min — ABNORMAL LOW (ref >60.00)
Glucose, Bld: 102 mg/dL — ABNORMAL HIGH (ref 70–99)
Potassium: 3.6 mEq/L (ref 3.5–5.1)
Sodium: 139 mEq/L (ref 135–145)

## 2020-12-24 MED ORDER — ONDANSETRON 4 MG PO TBDP: 4.0000 mg | ORAL_TABLET | Freq: Three times a day (TID) | ORAL | 0 refills | Status: DC | PRN

## 2020-12-24 MED ORDER — BACLOFEN 10 MG PO TABS: 10.0000 mg | ORAL_TABLET | Freq: Three times a day (TID) | ORAL | 0 refills | Status: DC

## 2020-12-24 NOTE — Patient Instructions (Signed)
You will be contacted regarding your referral to Dr. Jaynee Eagles.  Please let us know if you have not been contacted within two weeks.   Reduce your consumption of alcohol.   Stop by the lab prior to leaving today. I will notify you of your results once received.   Hold your amlodipine blood pressure medication as it could be contributing to your ankle/leg swelling.  Monitor your blood pressure. It should run less than 135 on top and less than 90 on bottom.   It was a pleasure to see you today!

## 2020-12-24 NOTE — Progress Notes (Signed)
Subjective:    Patient ID: [name], male    DOB: 1950/09/24, 70 y.o.   MRN: [id number]  HPI  Devin Howell is a very pleasant 70 y.o. male with a history of CKD, chronic gout, anxiety and depression, nausea/vomiting/diarrhea, hypertension, depression, subdural hematoma who presents today for ED follow up. He is also needing refills of Baclofen for hiccups and Zofran for nausea.   He presented to [facility] on 12/21/20 for a four day history of shuffling gait, altered speech, bilateral hand tremors, lower extremity swelling.   Work up in the ED showed AKI with increase in creatinine to 2.26 from 1.37 in July 2022. Chest xray and CT chest negative for acute findings or edema. UA negative. CT head without bleeding or acute findings. MRI brain completed which was negative. He was found to have a high blood alcohol level of 212 with negative urine drug screen.   He was treated with Ativan and placed on CIWA protocol, symptoms of stuttering and tremors resolved. He was provided with IV fluids which improved renal function to creatinine of 1.84. he was discharged home later that evening with a prescription for Ativan.  Since his ED visit his symptoms have improved gradually. According tho his wife, he is more like his usual self today than he has been over the last few days. Yesterday he slept all day, some shuffling gait, little appetite.  He has three mixed alcohol drinks everyday and has done so for numerous years.  His wife doesn't feel that he is intoxicated at home. He is eating some, mostly decreased appetite. He is drinking water.  He is following with GI, underwent endoscopy recently, two ulcers, otherwise negative work.     Review of Systems  Cardiovascular:  Positive for leg swelling.  Neurological:  Positive for tremors and speech difficulty.       See HPI  Psychiatric/Behavioral:  Positive for confusion.         Past Medical History:  Diagnosis Date   Cataract     Chronic gout    CKD (chronic kidney disease)    pt. denies   Erectile dysfunction    GERD (gastroesophageal reflux disease)    Herpes simplex    Hx of adenomatous polyp of colon 04/12/2017   Hyperlipidemia    pt. denies   Hypertension    pt. denies   OSA on CPAP    Sleep apnea    cpap   Testosterone deficiency     Social History   Socioeconomic History   Marital status: Married    Spouse name: Not on file   Number of children: Not on file   Years of education: Not on file   Highest education level: Not on file  Occupational History   Not on file  Tobacco Use   Smoking status: Never    Passive exposure: Never   Smokeless tobacco: Never  Vaping Use   Vaping Use: Never used  Substance and Sexual Activity   Alcohol use: Yes    Alcohol/week: 2.0 standard drinks    Types: 2 Glasses of wine per week   Drug use: No   Sexual activity: Not Currently  Other Topics Concern   Not on file  Social History Narrative   Not on file   Social Determinants of Health   Financial Resource Strain: Low Risk    Difficulty of Paying Living Expenses: Not hard at all  Food Insecurity: No Food Insecurity   Worried About Running  Out of Food in the Last Year: Never true   Ran Out of Food in the Last Year: Never true  Transportation Needs: No Transportation Needs   Lack of Transportation (Medical): No   Lack of Transportation (Non-Medical): No  Physical Activity: Sufficiently Active   Days of Exercise per Week: 4 days   Minutes of Exercise per Session: 60 min  Stress: No Stress Concern Present   Feeling of Stress : Not at all  Social Connections: Not on file  Intimate Partner Violence: Not At Risk   Fear of Current or Ex-Partner: No   Emotionally Abused: No   Physically Abused: No   Sexually Abused: No    Past Surgical History:  Procedure Laterality Date   COLONOSCOPY     TONSILLECTOMY AND ADENOIDECTOMY  1960    Family History  Problem Relation Age of Onset   Rectal cancer  Mother    Colon polyps Neg Hx    Esophageal cancer Neg Hx    Colon cancer Neg Hx    Stomach cancer Neg Hx     No Known Allergies  Current Outpatient Medications on File Prior to Visit  Medication Sig Dispense Refill   allopurinol (ZYLOPRIM) 300 MG tablet TAKE 1 TABLET (300 MG TOTAL) BY MOUTH DAILY. FOR GOUT PREVENTION. 90 tablet 2   amLODipine (NORVASC) 5 MG tablet Take 5 mg by mouth daily.     atorvastatin (LIPITOR) 20 MG tablet Take 1 tablet (20 mg total) by mouth daily. For cholesterol. 90 tablet 2   citalopram (CELEXA) 20 MG tablet TAKE 1 TABLET (20 MG TOTAL) BY MOUTH DAILY. FOR ANXIETY AND DEPRESSION. 90 tablet 0   furosemide (LASIX) 20 MG tablet Take 20 mg by mouth 3 (three) times a week.     levothyroxine (SYNTHROID) 50 MCG tablet Take 1 tablet by mouth every morning on an empty stomach with water only.  No food or other medications for 30 minutes. (Patient taking differently: Take 50 mcg by mouth See admin instructions. Take 1 tablet by mouth every morning on an empty stomach with water only.  No food or other medications for 30 minutes.) 90 tablet 2   LORazepam (ATIVAN) 1 MG tablet Take 1 tablet (1 mg total) by mouth 3 (three) times daily as needed for anxiety (And tremors). 15 tablet 0   metoprolol succinate (TOPROL-XL) 25 MG 24 hr tablet Take 25 mg by mouth daily.     ondansetron (ZOFRAN ODT) 4 MG disintegrating tablet Take 1 tablet (4 mg total) by mouth every 8 (eight) hours as needed for nausea or vomiting. 20 tablet 0   pantoprazole (PROTONIX) 40 MG tablet Take 1 tablet (40 mg total) by mouth 2 (two) times daily. 180 tablet 1   tamsulosin (FLOMAX) 0.4 MG CAPS capsule Take 0.4 mg by mouth at bedtime.     testosterone cypionate (DEPOTESTOSTERONE CYPIONATE) 200 MG/ML injection Inject 100 mg into the muscle every 28 (twenty-eight) days.     No current facility-administered medications on file prior to visit.    BP 130/70   Pulse 72   Temp (!) 97.3 F (36.3 C) (Temporal)   Ht  5\' 8"  (1.727 m)   Wt 231 lb 9.6 oz (105.1 kg)   SpO2 94%   BMI 35.21 kg/m  Objective:   Physical Exam Cardiovascular:     Rate and Rhythm: Normal rate and regular rhythm.  Pulmonary:     Effort: Pulmonary effort is normal.     Breath sounds: Normal breath sounds.  No wheezing or rales.  Musculoskeletal:     Cervical back: Neck supple.  Skin:    General: Skin is warm and dry.  Neurological:     Mental Status: He is alert and oriented to person, place, and time.  Psychiatric:        Mood and Affect: Mood normal.           Assessment & Plan:      This visit occurred during the SARS-CoV-2 public health emergency.  Safety protocols were in place, including screening questions prior to the visit, additional usage of staff PPE, and extensive cleaning of exam room while observing appropriate contact time as indicated for disinfecting solutions.

## 2020-12-24 NOTE — Assessment & Plan Note (Signed)
AKI during ED visit last week.  Repeat BMP pending Advised he reduce alcohol consumption.  Following with nephrology.

## 2020-12-24 NOTE — Assessment & Plan Note (Addendum)
With tremor and other symptoms.  Work up in ED grossly negative. Symptoms could be secondary to alcohol dependence.  He is on SSRI which should not be mixed with alcohol.   Discussed blood alcohol level as it was 212. Recommended he either refrain from or significantly reduce alcohol consumption.   Will refer to Neurology to rule out parkinson's or other neurological disorders.

## 2020-12-24 NOTE — Assessment & Plan Note (Signed)
Underwent work up per GI, no significant findings for recurrent symptoms.  Do suspect his alcohol intake coupled with CKD may be contributing.  Refill provided for Zofran to use PRN. Follow up with GI as scheduled.

## 2020-12-24 NOTE — Assessment & Plan Note (Signed)
Localized to bilateral ankles today. Holding amlodipine 5 mg.  Patient and wife will update.   Echocardiogram from 2019 evaluated, grade 1 DD.  Normal LV function.

## 2020-12-24 NOTE — Assessment & Plan Note (Signed)
Controlled.  Discussed that amlodipine may be contributing to ankle edema. Will have him hold amlodipine and update.  Consider alternative BP lowering agent if warranted. Will need to consult with nephrology.

## 2020-12-24 NOTE — Assessment & Plan Note (Signed)
Refill provided for Baclofen 10 mg to use TID PRN.

## 2020-12-25 ENCOUNTER — Ambulatory Visit: Payer: Medicare HMO | Admitting: Primary Care

## 2021-01-01 DIAGNOSIS — R3911 Hesitancy of micturition: Secondary | ICD-10-CM | POA: Diagnosis not present

## 2021-01-01 DIAGNOSIS — I129 Hypertensive chronic kidney disease with stage 1 through stage 4 chronic kidney disease, or unspecified chronic kidney disease: Secondary | ICD-10-CM | POA: Diagnosis not present

## 2021-01-01 DIAGNOSIS — N179 Acute kidney failure, unspecified: Secondary | ICD-10-CM | POA: Diagnosis not present

## 2021-01-01 DIAGNOSIS — N200 Calculus of kidney: Secondary | ICD-10-CM | POA: Diagnosis not present

## 2021-01-01 DIAGNOSIS — R809 Proteinuria, unspecified: Secondary | ICD-10-CM | POA: Diagnosis not present

## 2021-01-01 DIAGNOSIS — N1831 Chronic kidney disease, stage 3a: Secondary | ICD-10-CM | POA: Diagnosis not present

## 2021-01-14 ENCOUNTER — Ambulatory Visit (INDEPENDENT_AMBULATORY_CARE_PROVIDER_SITE_OTHER): Payer: Medicare HMO | Admitting: Physician Assistant

## 2021-01-14 ENCOUNTER — Encounter: Payer: Self-pay | Admitting: Physician Assistant

## 2021-01-14 VITALS — BP 120/78 | HR 76 | Ht 65.0 in | Wt 231.2 lb

## 2021-01-14 DIAGNOSIS — K253 Acute gastric ulcer without hemorrhage or perforation: Secondary | ICD-10-CM | POA: Diagnosis not present

## 2021-01-14 DIAGNOSIS — R112 Nausea with vomiting, unspecified: Secondary | ICD-10-CM | POA: Diagnosis not present

## 2021-01-14 DIAGNOSIS — R197 Diarrhea, unspecified: Secondary | ICD-10-CM

## 2021-01-14 NOTE — Patient Instructions (Signed)
Continue Pantoprazole 40 mg twice daily.   Keep follow up with Dr. Carlean Purl.   If you are age 71 or older, your body mass index should be between 23-30. Your Body mass index is 38.48 kg/m. If this is out of the aforementioned range listed, please consider follow up with your Primary Care Provider.  If you are age 36 or younger, your body mass index should be between 19-25. Your Body mass index is 38.48 kg/m. If this is out of the aformentioned range listed, please consider follow up with your Primary Care Provider.   ________________________________________________________  The [facility] providers would like to encourage you to use [facility] to communicate with providers for non-urgent requests or questions.  Due to long hold times on the telephone, sending your provider a message by [facility] may be a faster and more efficient way to get a response.  Please allow 48 business hours for a response.  Please remember that this is for non-urgent requests.  _______________________________________________________

## 2021-01-14 NOTE — Progress Notes (Signed)
Chief Complaint: Follow-up nausea vomiting and diarrhea  HPI:    Devin Howell is a 70 year old male with a past medical history of CKD (08/24/2017 echo with an LVEF 60-65% no aortic stenosis), GERD, OSA on CPAP and others listed below, known to Dr. Carlean Purl, who presents to clinic today for follow-up of his nausea, vomiting and diarrhea.    04/08/2017 colonoscopy Dr. Carlean Purl with 1 8 mm polyp in the transverse colon and otherwise normal.  Pathology showed an 8 mm tubular adenoma.  Recall placed for 2024.    08/16/2020 right upper quadrant ultrasound with hepatic steatosis.  Gallbladder sludge with no findings of acute cholecystitis.    08/16/2020 CT of the head without contrast with stable trace left-sided subdural hematoma and trace occipital subarachnoid hemorrhage, stable posterior midline scalp hematoma and/or laceration.    10/17/2020 office visit with PCP to discuss nausea, vomiting and loose stools.  Symptoms had started 10 days prior.  Describes some lower extremity edema at the same time as well as [place] to [place] bathroom for diarrhea and "blacked out".  That time could not eat or drink or he would vomit up food.  Describes 10+ episodes a day of vomiting and 4-5 episodes of diarrhea.  Apparently admitted to the hospital in May for similar symptoms and was presumed to have gastritis.  CT negative.  He was given Zofran ODT 4 mg.    10/21/2020 BMP with a potassium of 3.4, creatinine normal.  GI pathogen panel negative.  10/17/2020 CBC with platelets minimally decreased at 117.    10/20/2020 patient reported to his PCP he was feeling better, but then on 10/28/2020 he had recurrence of nausea.    11/26/2020 patient seen in clinic by me and described that he had started with his initial symptoms back in May with nausea and violent vomiting and diarrhea.  He had had about 3 episodes of the severe symptoms.  They seem to resolve with Zofran but he was nervous that they were going to  occur again as they seem to occur every month.  He was scheduled for an EGD with Dr. Tarri Glenn as she had sooner availability than Dr. Carlean Purl.  Also recommend he uses Zofran 4 mg as prescribed.  Did ask him to try and keep a food journal to see if there are any triggers.  Discussed that he did have an ultrasound showing some sludge in his gallbladder.  If EGD was negative would recommend further work-up with a HIDA scan versus CT of the abdomen pelvis.    11/27/2020 EGD with a Z-line irregular 40 cm from the incisors, nonbleeding gastric ulcers with no stigmata of bleeding and duodenitis.  He was told to use his Pantoprazole 40 mg twice daily x12 weeks.  Biopsy showed mild reflux from the esophagus and inflammation and ulceration from the stomach.  That time was discussed he may need a repeat EGD after 12 weeks of pantoprazole to ensure that ulcers were healed.    12/18/2020 patient called to describe continued nausea and vomiting.  He was told to have an abdominal x-ray two-view if he had another attack.  Also discussed a possible CT enterography.    12/21/2020 patient presented to the ED with a 4-day history of shuffling gait, altered speech and bilateral hand tremors.  Work-up showed AKI with a increase in creatinine to 2.26 from his baseline of 1.37.  Chest x-ray, CT chest and CT head/MRI head negative.  He  was found to have a high blood alcohol level of 212 with negative urine drug screen.  Treated with Ativan.  At that time he described 3 mixed alcoholic drinks every day and had done some for numerous years.  He was referred to neurology.    Today, the patient tells me that he has not had an episode in about 3 months.  He thinks maybe the Pantoprazole 40 mg twice a day is helping.  Also tells me he is aware of his increased alcohol intake and has "really backed down".  Denies any current symptoms.    Denies fever, chills, weight loss, change in bowel habits or abdominal pain.  Past Medical History:   Diagnosis Date   Cataract    Chronic gout    CKD (chronic kidney disease)    pt. denies   Erectile dysfunction    GERD (gastroesophageal reflux disease)    Herpes simplex    Hx of adenomatous polyp of colon 04/12/2017   Hyperlipidemia    pt. denies   Hypertension    pt. denies   OSA on CPAP    Sleep apnea    cpap   Subarachnoid hematoma (HCC)    Subdural hematoma (HCC)    Testosterone deficiency     Past Surgical History:  Procedure Laterality Date   COLONOSCOPY     TONSILLECTOMY AND ADENOIDECTOMY  1960    Current Outpatient Medications  Medication Sig Dispense Refill   allopurinol (ZYLOPRIM) 300 MG tablet TAKE 1 TABLET (300 MG TOTAL) BY MOUTH DAILY. FOR GOUT PREVENTION. 90 tablet 2   amLODipine (NORVASC) 5 MG tablet Take 5 mg by mouth daily.     atorvastatin (LIPITOR) 20 MG tablet Take 1 tablet (20 mg total) by mouth daily. For cholesterol. 90 tablet 2   baclofen (LIORESAL) 10 MG tablet Take 1 tablet (10 mg total) by mouth 3 (three) times daily. As needed for hiccups 90 tablet 0   citalopram (CELEXA) 20 MG tablet TAKE 1 TABLET (20 MG TOTAL) BY MOUTH DAILY. FOR ANXIETY AND DEPRESSION. 90 tablet 0   furosemide (LASIX) 20 MG tablet Take 20 mg by mouth 3 (three) times a week.     levothyroxine (SYNTHROID) 50 MCG tablet Take 1 tablet by mouth every morning on an empty stomach with water only.  No food or other medications for 30 minutes. (Patient taking differently: Take 50 mcg by mouth See admin instructions. Take 1 tablet by mouth every morning on an empty stomach with water only.  No food or other medications for 30 minutes.) 90 tablet 2   LORazepam (ATIVAN) 1 MG tablet Take 1 tablet (1 mg total) by mouth 3 (three) times daily as needed for anxiety (And tremors). 15 tablet 0   metoprolol succinate (TOPROL-XL) 25 MG 24 hr tablet Take 25 mg by mouth daily.     ondansetron (ZOFRAN ODT) 4 MG disintegrating tablet Take 1 tablet (4 mg total) by mouth every 8 (eight) hours as needed  for nausea or vomiting. 20 tablet 0   pantoprazole (PROTONIX) 40 MG tablet Take 1 tablet (40 mg total) by mouth 2 (two) times daily. 180 tablet 1   tamsulosin (FLOMAX) 0.4 MG CAPS capsule Take 0.4 mg by mouth at bedtime.     testosterone cypionate (DEPOTESTOSTERONE CYPIONATE) 200 MG/ML injection Inject 100 mg into the muscle every 28 (twenty-eight) days.     No current facility-administered medications for this visit.    Allergies as of 01/14/2021   (No Known Allergies)  Family History  Problem Relation Age of Onset   Rectal cancer Mother    Colon polyps Neg Hx    Esophageal cancer Neg Hx    Colon cancer Neg Hx    Stomach cancer Neg Hx     Social History   Socioeconomic History   Marital status: Married    Spouse name: Not on file   Number of children: Not on file   Years of education: Not on file   Highest education level: Not on file  Occupational History   Not on file  Tobacco Use   Smoking status: Never    Passive exposure: Never   Smokeless tobacco: Never  Vaping Use   Vaping Use: Never used  Substance and Sexual Activity   Alcohol use: Yes    Alcohol/week: 2.0 standard drinks    Types: 2 Glasses of wine per week   Drug use: No   Sexual activity: Not Currently  Other Topics Concern   Not on file  Social History Narrative   Not on file   Social Determinants of Health   Financial Resource Strain: Low Risk    Difficulty of Paying Living Expenses: Not hard at all  Food Insecurity: No Food Insecurity   Worried About Charity fundraiser in the Last Year: Never true   [place] in the Last Year: Never true  Transportation Needs: No Transportation Needs   Lack of Transportation (Medical): No   Lack of Transportation (Non-Medical): No  Physical Activity: Sufficiently Active   Days of Exercise per Week: 4 days   Minutes of Exercise per Session: 60 min  Stress: No Stress Concern Present   Feeling of Stress : Not at all  Social Connections: Not on file   Intimate Partner Violence: Not At Risk   Fear of Current or Ex-Partner: No   Emotionally Abused: No   Physically Abused: No   Sexually Abused: No    Review of Systems:    Constitutional: No weight loss, fever or chills Cardiovascular: No chest pain  Respiratory: No SOB Gastrointestinal: See HPI and otherwise negative   Physical Exam:  Vital signs: BP 120/78 (BP Location: Left Arm, Patient Position: Sitting, Cuff Size: Normal)   Pulse 76   Ht 5\' 5"  (1.651 m) Comment: height measured without shoes  Wt 231 lb 4 oz (104.9 kg)   BMI 38.48 kg/m    Constitutional:   Pleasant overweight Caucasian male appears to be in NAD, Well developed, Well nourished, alert and cooperative Respiratory: Respirations even and unlabored. Lungs clear to auscultation bilaterally.   No wheezes, crackles, or rhonchi.  Cardiovascular: Normal S1, S2. No MRG. Regular rate and rhythm. No peripheral edema, cyanosis or pallor.  Gastrointestinal:  Soft, nondistended, nontender. No rebound or guarding. Normal bowel sounds. No appreciable masses or hepatomegaly. Psychiatric: Demonstrates good judgement and reason without abnormal affect or behaviors.  RELEVANT LABS AND IMAGING: CBC    Component Value Date/Time   WBC 5.9 12/21/2020 1053   RBC 3.87 (L) 12/21/2020 1053   HGB 13.6 12/21/2020 1053   HCT 39.9 12/21/2020 1053   PLT 181 12/21/2020 1053   MCV 103.1 (H) 12/21/2020 1053   MCH 35.1 (H) 12/21/2020 1053   MCHC 34.1 12/21/2020 1053   RDW 14.1 12/21/2020 1053   LYMPHSABS 1.8 12/21/2020 1053   MONOABS 0.4 12/21/2020 1053   EOSABS 0.2 12/21/2020 1053   BASOSABS 0.1 12/21/2020 1053    CMP     Component Value Date/Time  NA 139 12/24/2020 1431   NA 143 09/13/2017 1248   K 3.6 12/24/2020 1431   CL 92 (L) 12/24/2020 1431   CO2 35 (H) 12/24/2020 1431   GLUCOSE 102 (H) 12/24/2020 1431   BUN 24 (H) 12/24/2020 1431   BUN 15 09/13/2017 1248   CREATININE 1.73 (H) 12/24/2020 1431   CREATININE 1.39 (H)  09/15/2020 1124   CREATININE 2.94 (H) 01/20/2018 1451   CALCIUM 9.9 12/24/2020 1431   PROT 7.0 12/21/2020 1053   ALBUMIN 3.8 12/21/2020 1053   AST 33 12/21/2020 1053   AST 26 09/15/2020 1124   ALT 33 12/21/2020 1053   ALT 25 09/15/2020 1124   ALKPHOS 86 12/21/2020 1053   BILITOT 0.5 12/21/2020 1053   BILITOT 1.1 09/15/2020 1124   GFRNONAA 39 (L) 12/21/2020 1940   GFRNONAA 54 (L) 09/15/2020 1124   GFRAA 52 (L) 09/13/2017 1248    Assessment: 1.  Nausea and vomiting with diarrhea: 1 episode a month for 3 to 4 months, recent EGD with gastric ulcers, no episodes since being started on Pantoprazole 40 mg twice a day over the past month or so 2.  Hand tremors: Following with neurology in regards to this 3.  Alcoholism: Previously with elevated alcohol level in the ER and reports 3 mixed drinks a day, he has "cut back" over the past month; likely contributing to all of the above  Plan: 1.  Discussed that patient's alcohol drinking is likely contributing to symptoms.  He has cut back and is actually not had any episodes over the past 2 to 3 months for him since being started on Pantoprazole 40 mg twice a day.  Ulcers could also have contributed. 2.  At this time patient has follow-up with Dr. Carlean Purl in another month.  He would likely benefit from repeat EGD but would like to wait to see Dr. Carlean Purl before scheduling this.  If he has another episode could consider small bowel enterography as per recommendations from Dr. Carlean Purl. 3.  Continue Pantoprazole 40 mg twice daily for now.  He tells me he has enough refills. 4.  Follow-up with neurology as scheduled. 5.  Patient to follow in clinic with Dr. Carlean Purl as scheduled.  Ellouise Newer, PA-C [facility] 01/14/2021, 1:17 PM  Cc: Pleas Koch, NP

## 2021-01-16 ENCOUNTER — Other Ambulatory Visit: Payer: Self-pay | Admitting: Primary Care

## 2021-01-16 DIAGNOSIS — R066 Hiccough: Secondary | ICD-10-CM

## 2021-01-19 ENCOUNTER — Other Ambulatory Visit: Payer: Self-pay

## 2021-01-19 ENCOUNTER — Ambulatory Visit: Payer: Medicare HMO | Admitting: Cardiology

## 2021-01-19 ENCOUNTER — Ambulatory Visit (INDEPENDENT_AMBULATORY_CARE_PROVIDER_SITE_OTHER): Payer: Medicare HMO

## 2021-01-19 VITALS — BP 140/72 | HR 76 | Ht 68.0 in | Wt 229.8 lb

## 2021-01-19 DIAGNOSIS — E669 Obesity, unspecified: Secondary | ICD-10-CM

## 2021-01-19 DIAGNOSIS — R42 Dizziness and giddiness: Secondary | ICD-10-CM

## 2021-01-19 DIAGNOSIS — E782 Mixed hyperlipidemia: Secondary | ICD-10-CM | POA: Diagnosis not present

## 2021-01-19 DIAGNOSIS — R030 Elevated blood-pressure reading, without diagnosis of hypertension: Secondary | ICD-10-CM | POA: Insufficient documentation

## 2021-01-19 NOTE — Progress Notes (Signed)
Cardiology Office Note:    Date:  01/19/2021   ID:  Devin Howell, DOB 1950-06-11, MRN [id number]  PCP:  Pleas Koch, NP  Cardiologist:  Fransico Him, MD  Electrophysiologist:  None   Referring MD: Pleas Koch, NP   " I have had some issue recently"  History of Present Illness:    Devin Howell is a 70 y.o. male with a hx of CKD, chronic gout, anxiety, depression is here today to be evaluated.  The patient tells me that back in May he experienced an episode where he had significant diarrhea and vomiting along with malaise.  He went to the emergency department he was hospitalized for a few days for dehydration and discharged to home. Then he presented to on 12/21/20 for a four day history of shuffling gait, altered speech, bilateral hand tremors, lower extremity swelling.  During that time his work-up  showed AKI with increase in creatinine to 2.26 from 1.37 in July 2022. Chest xray and CT chest negative for acute findings or edema. UA negative. CT head without bleeding or acute findings. MRI brain completed which was negative. He was found to have a high blood alcohol level of 212 with negative urine drug screen.    He was treated with Ativan and placed on CIWA protocol, symptoms of stuttering and tremors resolved. He was provided with IV fluids which improved renal function to creatinine of 1.84. he was discharged home later that evening with a prescription for Ativan.  Since that time he has followed with his PCP and has been asked to also see cardiology. He describes his episodes as intermittent and tells me that most time is abrupt he gets diarrhea and vomiting.  But he also reports that he is got some palpitations with lightheadedness with this.  He also has seen GI and underwent endoscopy which showed 2 ulcers and has not been started on Protonix.  No chest pain, no shortness of breath.  Of note the patient was seen in our practice back in 2020 at which time he  had some orthostasis.  He also had an echocardiogram which was normal.   Past Medical History:  Diagnosis Date   Cataract    Chronic gout    CKD (chronic kidney disease)    pt. denies   Erectile dysfunction    GERD (gastroesophageal reflux disease)    Herpes simplex    Hx of adenomatous polyp of colon 04/12/2017   Hyperlipidemia    pt. denies   Hypertension    pt. denies   OSA on CPAP    Sleep apnea    cpap   Subarachnoid hematoma (HCC)    Subdural hematoma (HCC)    Testosterone deficiency     Past Surgical History:  Procedure Laterality Date   COLONOSCOPY     TONSILLECTOMY AND ADENOIDECTOMY  1960    Current Medications: Current Meds  Medication Sig   allopurinol (ZYLOPRIM) 300 MG tablet TAKE 1 TABLET (300 MG TOTAL) BY MOUTH DAILY. FOR GOUT PREVENTION.   atorvastatin (LIPITOR) 20 MG tablet Take 1 tablet (20 mg total) by mouth daily. For cholesterol.   baclofen (LIORESAL) 10 MG tablet Take 1 tablet (10 mg total) by mouth 3 (three) times daily. As needed for hiccups   citalopram (CELEXA) 20 MG tablet TAKE 1 TABLET (20 MG TOTAL) BY MOUTH DAILY. FOR ANXIETY AND DEPRESSION.   levothyroxine (SYNTHROID) 50 MCG tablet Take 1 tablet by mouth every morning on an empty stomach with  water only.  No food or other medications for 30 minutes. (Patient taking differently: Take 50 mcg by mouth See admin instructions. Take 1 tablet by mouth every morning on an empty stomach with water only.  No food or other medications for 30 minutes.)   metoprolol succinate (TOPROL-XL) 25 MG 24 hr tablet Take 25 mg by mouth daily.   ondansetron (ZOFRAN ODT) 4 MG disintegrating tablet Take 1 tablet (4 mg total) by mouth every 8 (eight) hours as needed for nausea or vomiting.   pantoprazole (PROTONIX) 40 MG tablet Take 1 tablet (40 mg total) by mouth 2 (two) times daily.   tamsulosin (FLOMAX) 0.4 MG CAPS capsule Take 0.4 mg by mouth at bedtime.   testosterone cypionate (DEPOTESTOSTERONE CYPIONATE) 200 MG/ML  injection Inject 100 mg into the muscle every 28 (twenty-eight) days.     Allergies:   Patient has no known allergies.   Social History   Socioeconomic History   Marital status: Married    Spouse name: Not on file   Number of children: Not on file   Years of education: Not on file   Highest education level: Not on file  Occupational History   Not on file  Tobacco Use   Smoking status: Never    Passive exposure: Never   Smokeless tobacco: Never  Vaping Use   Vaping Use: Never used  Substance and Sexual Activity   Alcohol use: Yes    Alcohol/week: 2.0 standard drinks    Types: 2 Glasses of wine per week   Drug use: No   Sexual activity: Not Currently  Other Topics Concern   Not on file  Social History Narrative   Not on file   Social Determinants of Health   Financial Resource Strain: Low Risk    Difficulty of Paying Living Expenses: Not hard at all  Food Insecurity: No Food Insecurity   Worried About Charity fundraiser in the Last Year: Never true   [place] in the Last Year: Never true  Transportation Needs: No Transportation Needs   Lack of Transportation (Medical): No   Lack of Transportation (Non-Medical): No  Physical Activity: Sufficiently Active   Days of Exercise per Week: 4 days   Minutes of Exercise per Session: 60 min  Stress: No Stress Concern Present   Feeling of Stress : Not at all  Social Connections: Not on file     Family History: The patient's family history includes Rectal cancer in his mother. There is no history of Colon polyps, Esophageal cancer, Colon cancer, or Stomach cancer.  ROS:   Review of Systems  Constitution: Negative for decreased appetite, fever and weight gain.  HENT: Negative for congestion, ear discharge, hoarse voice and sore throat.   Eyes: Negative for discharge, redness, vision loss in right eye and visual halos.  Cardiovascular: Negative for chest pain, dyspnea on exertion, leg swelling, orthopnea and  palpitations.  Respiratory: Negative for cough, hemoptysis, shortness of breath and snoring.   Endocrine: Negative for heat intolerance and polyphagia.  Hematologic/Lymphatic: Negative for bleeding problem. Does not bruise/bleed easily.  Skin: Negative for flushing, nail changes, rash and suspicious lesions.  Musculoskeletal: Negative for arthritis, joint pain, muscle cramps, myalgias, neck pain and stiffness.  Gastrointestinal: Negative for abdominal pain, bowel incontinence, diarrhea and excessive appetite.  Genitourinary: Negative for decreased libido, genital sores and incomplete emptying.  Neurological: Negative for brief paralysis, focal weakness, headaches and loss of balance.  Psychiatric/Behavioral: Negative for altered mental status, depression  and suicidal ideas.  Allergic/Immunologic: Negative for HIV exposure and persistent infections.    EKGs/Labs/Other Studies Reviewed:    The following studies were reviewed today:   EKG:  The ekg ordered today demonstrates sinus rhythm, heart rate 76 bpm.  Echo 08/25/2018 Study Conclusions   - Left ventricle: The cavity size was normal. Systolic function was    normal. The estimated ejection fraction was in the range of 60%    to 65%. Doppler parameters are consistent with abnormal left    ventricular relaxation (grade 1 diastolic dysfunction).  - Aortic valve: Transvalvular velocity was within the normal range.    There was no stenosis. There was no regurgitation.  - Mitral valve: Transvalvular velocity was within the normal range.    There was no evidence for stenosis. There was trivial    regurgitation.  - Right ventricle: The cavity size was normal. Wall thickness was    normal. Systolic function was normal.  - Atrial septum: No defect or patent foramen ovale was identified.  - Tricuspid valve: There was trivial regurgitation.  - Pulmonary arteries: Systolic pressure was within the normal    range. PA peak pressure: 27 mm Hg  (S).   -------------------------------------------------------------------  Study data:  No prior study was available for comparison.  Study  status:  Routine.  Procedure:  The patient reported no pain pre or  post test. Transthoracic echocardiography. Image quality was  adequate.  Study completion:  There were no complications.  Transthoracic echocardiography.  M-mode, complete 2D, spectral  Doppler, and color Doppler.  Birthdate:  Patient birthdate:  10-Mar-1951.  Age:  Patient is 70 yr old.  Sex:  Gender: male.  BMI: 33.2 kg/m^2.  Blood pressure:     122/70  Patient status:  Outpatient.  Study date:  Study date: 08/24/2017. Study time: 01:50  PM.  Location:  [facility]   -------------------------------------------------------------------   -------------------------------------------------------------------  Left ventricle:  The cavity size was normal. Systolic function was  normal. The estimated ejection fraction was in the range of 60% to  65%. The transmitral flow pattern was normal. The deceleration time  of the early transmitral flow velocity was normal. The pulmonary  vein flow pattern was normal. The tissue Doppler parameters were  normal. Doppler parameters are consistent with abnormal left  ventricular relaxation (grade 1 diastolic dysfunction). There was  no evidence of elevated ventricular filling pressure by Doppler  parameters.   -------------------------------------------------------------------  Aortic valve:   Mildly thickened, mildly calcified leaflets. Cusp  separation was normal.  Doppler:  Transvalvular velocity was within  the normal range. There was no stenosis. There was no  regurgitation.   -------------------------------------------------------------------  Aorta:  The aorta was normal, not dilated, and non-diseased.   -------------------------------------------------------------------  Mitral valve:   Structurally normal valve.   Leaflet  separation was  normal.  Doppler:  Transvalvular velocity was within the normal  range. There was no evidence for stenosis. There was trivial  regurgitation.    Valve area by pressure half-time: 5 cm^2. Indexed  valve area by pressure half-time: 2.26 cm^2/m^2.   -------------------------------------------------------------------  Left atrium:  The atrium was normal in size.   -------------------------------------------------------------------  Atrial septum:  No defect or patent foramen ovale was identified.     -------------------------------------------------------------------  Right ventricle:  The cavity size was normal. Wall thickness was  normal. Systolic function was normal.   -------------------------------------------------------------------  Pulmonic valve:    Structurally normal valve.   Cusp separation was  normal.  Doppler:  Transvalvular velocity was within the normal  range. There was no regurgitation.   -------------------------------------------------------------------  Tricuspid valve:   Structurally normal valve.   Leaflet separation  was normal.  Doppler:  Transvalvular velocity was within the normal  range. There was trivial regurgitation.   -------------------------------------------------------------------  Pulmonary artery:   The main pulmonary artery was normal-sized.  Systolic pressure was within the normal range.   -------------------------------------------------------------------  Right atrium:  The atrium was normal in size.   -------------------------------------------------------------------  Pericardium:  The pericardium was normal in appearance.   -------------------------------------------------------------------  Systemic veins:  Inferior vena cava: The vessel was dilated. The respirophasic  diameter changes were blunted (< 50%), consistent with elevated  central venous pressure.    -------------------------------------------------------------------  Post procedure conclusions  Ascending Aorta:   - The aorta was normal, not dilated, and non-diseased.   -------------------------------------------------------------------  Recent Labs: 08/15/2020: TSH 5.724 08/18/2020: Magnesium 1.6 12/21/2020: ALT 33; B Natriuretic Peptide 171.4; Hemoglobin 13.6; Platelets 181 12/24/2020: BUN 24; Creatinine, Ser 1.73; Potassium 3.6; Sodium 139  Recent Lipid Panel    Component Value Date/Time   CHOL 281 (H) 02/19/2020 1137   TRIG 54.0 02/19/2020 1137   HDL 162.50 02/19/2020 1137   CHOLHDL 2 02/19/2020 1137   VLDL 10.8 02/19/2020 1137   LDLCALC 107 (H) 02/19/2020 1137   LDLCALC 87 01/20/2018 1451    Physical Exam:    VS:  BP 140/72   Pulse 76   Ht 5\' 8"  (1.727 m)   Wt 229 lb 12.8 oz (104.2 kg)   SpO2 94%   BMI 34.94 kg/m     Wt Readings from Last 3 Encounters:  01/19/21 229 lb 12.8 oz (104.2 kg)  01/14/21 231 lb 4 oz (104.9 kg)  12/24/20 231 lb 9.6 oz (105.1 kg)     GEN: Well nourished, well developed in no acute distress HEENT: Normal NECK: No JVD; No carotid bruits LYMPHATICS: No lymphadenopathy CARDIAC: S1S2 noted,RRR, no murmurs, rubs, gallops RESPIRATORY:  Clear to auscultation without rales, wheezing or rhonchi  ABDOMEN: Soft, non-tender, non-distended, +bowel sounds, no guarding. EXTREMITIES: No edema, No cyanosis, no clubbing MUSCULOSKELETAL:  No deformity  SKIN: Warm and dry NEUROLOGIC:  Alert and oriented x 3, non-focal PSYCHIATRIC:  Normal affect, good insight  ASSESSMENT:    1. Dizziness   2. Elevated blood pressure reading   3. Obesity (BMI 30-39.9)   4. Mixed hyperlipidemia    PLAN:     1.  I would like to rule out a cardiovascular etiology of this dizziness/lightheadedness with his episodes, therefore at this time I would like to placed a zio patch for  14  days.  I reviewed his echocardiogram which was done in 2020 at this time there is  no need for any repeat study. Once these testing have been performed amd reviewed further reccomendations will be made. For now, I do reccomend that the patient goes to the nearest ED if  symptoms recur.  His blood pressure is also slightly elevated in the office this is an isolated reading because his blood pressure from January 14, 2021 was 120/78.  K PN.  Advised the patient take his blood pressure daily and notify my office if his systolic blood pressure is above 150 mmHg for 3 readings. Hyperlipidemia - continue with current statin medication.  The patient understands the need to lose weight with diet and exercise. We have discussed specific strategies for this.  The patient is in agreement with the above plan. The patient  left the office in stable condition.  The patient will follow up as needed.   Medication Adjustments/Labs and Tests Ordered: Current medicines are reviewed at length with the patient today.  Concerns regarding medicines are outlined above.  Orders Placed This Encounter  Procedures   LONG TERM MONITOR (3-14 DAYS)   EKG 12-Lead   No orders of the defined types were placed in this encounter.   Patient Instructions  Medication Instructions:  Your physician recommends that you continue on your current medications as directed. Please refer to the Current Medication list given to you today.  *If you need a refill on your cardiac medications before your next appointment, please call your pharmacy*   Lab Work: None If you have labs (blood work) drawn today and your tests are completely normal, you will receive your results only by: [place] (if you have MyChart) OR A paper copy in the mail If you have any lab test that is abnormal or we need to change your treatment, we will call you to review the results.   Testing/Procedures:  ZIO XT- Long Term Monitor Instructions  Dr. Harriet Masson has requested you wear a ZIO patch monitor for 14 days.  This is a single patch  monitor. [employer] supplies one patch monitor per enrollment. Additional stickers are not available. Please do not apply patch if you will be having a Nuclear Stress Test,  Echocardiogram, Cardiac CT, MRI, or Chest Xray during the period you would be wearing the  monitor. The patch cannot be worn during these tests. You cannot remove and re-apply the  ZIO XT patch monitor.  Your ZIO patch monitor will be mailed 3 day USPS to your address on file. It may take 3-5 days  to receive your monitor after you have been enrolled.  Once you have received your monitor, please review the enclosed instructions. Your monitor  has already been registered assigning a specific monitor serial # to you.  Billing and Patient Assistance Program Information  We have supplied [employer] with any of your insurance information on file for billing purposes. [employer] offers a sliding scale Patient Assistance Program for patients that do not have  insurance, or whose insurance does not completely cover the cost of the ZIO monitor.  You must apply for the Patient Assistance Program to qualify for this discounted rate.  To apply, please call [employer] at [phone], select option 4, select option 2, ask to apply for  Patient Assistance Program. Theodore Demark will ask your household income, and how many people  are in your household. They will quote your out-of-pocket cost based on that information.  [employer] will also be able to set up a 22-month, interest-free payment plan if needed.  Applying the monitor   Shave hair from upper left chest.  Hold abrader disc by orange tab. Rub abrader in 40 strokes over the upper left chest as  indicated in your monitor instructions.  Clean area with 4 enclosed alcohol pads. Let dry.  Apply patch as indicated in monitor instructions. Patch will be placed under collarbone on left  side of chest with arrow pointing upward.  Rub patch adhesive wings for 2 minutes. Remove white label marked "1".  Remove the white  label marked "2". Rub patch adhesive wings for 2 additional minutes.  While looking in a mirror, press and release button in center of patch. A small green light will  flash 3-4 times. This will be your only indicator that the monitor has been turned on.  Do not shower for the first 24 hours. You may shower after the first 24 hours.  Press the button if you feel a symptom. You will hear a small click. Record Date, Time and  Symptom in the Patient Logbook.  When you are ready to remove the patch, follow instructions on the last 2 pages of Patient  Logbook. Stick patch monitor onto the last page of Patient Logbook.  Place Patient Logbook in the blue and white box. Use locking tab on box and tape box closed  securely. The blue and white box has prepaid postage on it. Please place it in the mailbox as  soon as possible. Your physician should have your test results approximately 7 days after the  monitor has been mailed back to [facility].  Call [place] at [phone] if you have questions regarding  your ZIO XT patch monitor. Call them immediately if you see an orange light blinking on your  monitor.  If your monitor falls off in less than 4 days, contact our Monitor department at [phone].  If your monitor becomes loose or falls off after 4 days call [employer] at [phone] for  suggestions on securing your monitor    Follow-Up: At [facility], you and your health needs are our priority.  As part of our continuing mission to provide you with exceptional heart care, we have created designated Provider Care Teams.  These Care Teams include your primary Cardiologist (physician) and Advanced Practice Providers (APPs -  Physician Assistants and Nurse Practitioners) who all work together to provide you with the care you need, when you need it.  We recommend signing up for the patient portal called "MyChart".  Sign up information is provided on  this After Visit Summary.  MyChart is used to connect with patients for Virtual Visits (Telemedicine).  Patients are able to view lab/test results, encounter notes, upcoming appointments, etc.  Non-urgent messages can be sent to your provider as well.   To learn more about what you can do with MyChart, go to [email or web address].    Your next appointment:    As needed  The format for your next appointment:   In Person  Provider:   Berniece Salines, DO [address], [place], [place] [address]    Other Instructions     Adopting a Healthy Lifestyle.  Know what a healthy weight is for you (roughly BMI <25) and aim to maintain this   Aim for 7+ servings of fruits and vegetables daily   65-80+ fluid ounces of water or unsweet tea for healthy kidneys   Limit to max 1 drink of alcohol per day; avoid smoking/tobacco   Limit animal fats in diet for cholesterol and heart health - choose grass fed whenever available   Avoid highly processed foods, and foods high in saturated/trans fats   Aim for low stress - take time to unwind and care for your mental health   Aim for 150 min of moderate intensity exercise weekly for heart health, and weights twice weekly for bone health   Aim for 7-9 hours of sleep daily   When it comes to diets, agreement about the perfect plan isnt easy to find, even among the experts. Experts at the [employer] developed an idea known as the Healthy Eating Plate. Just imagine a plate divided into logical, healthy portions.   The emphasis is on diet quality:   Load up on vegetables and fruits - one-half of your plate:  Aim for color and variety, and remember that potatoes dont count.   Go for whole grains - one-quarter of your plate: Whole wheat, barley, wheat berries, quinoa, oats, brown rice, and foods made with them. If you want pasta, go with whole wheat pasta.   Protein power - one-quarter of your plate: Fish, chicken, beans, and  nuts are all healthy, versatile protein sources. Limit red meat.   The diet, however, does go beyond the plate, offering a few other suggestions.   Use healthy plant oils, such as olive, canola, soy, corn, sunflower and peanut. Check the labels, and avoid partially hydrogenated oil, which have unhealthy trans fats.   If youre thirsty, drink water. Coffee and tea are good in moderation, but skip sugary drinks and limit milk and dairy products to one or two daily servings.   The type of carbohydrate in the diet is more important than the amount. Some sources of carbohydrates, such as vegetables, fruits, whole grains, and beans-are healthier than others.   Finally, stay active  Signed, Berniece Salines, DO  01/19/2021 9:43 AM    [place]

## 2021-01-19 NOTE — Patient Instructions (Addendum)
Medication Instructions:  Your physician recommends that you continue on your current medications as directed. Please refer to the Current Medication list given to you today.  *If you need a refill on your cardiac medications before your next appointment, please call your pharmacy*   Lab Work: None If you have labs (blood work) drawn today and your tests are completely normal, you will receive your results only by: [place] (if you have MyChart) OR A paper copy in the mail If you have any lab test that is abnormal or we need to change your treatment, we will call you to review the results.   Testing/Procedures:  ZIO XT- Long Term Monitor Instructions  Dr. Harriet Masson has requested you wear a ZIO patch monitor for 14 days.  This is a single patch monitor. [employer] supplies one patch monitor per enrollment. Additional stickers are not available. Please do not apply patch if you will be having a Nuclear Stress Test,  Echocardiogram, Cardiac CT, MRI, or Chest Xray during the period you would be wearing the  monitor. The patch cannot be worn during these tests. You cannot remove and re-apply the  ZIO XT patch monitor.  Your ZIO patch monitor will be mailed 3 day USPS to your address on file. It may take 3-5 days  to receive your monitor after you have been enrolled.  Once you have received your monitor, please review the enclosed instructions. Your monitor  has already been registered assigning a specific monitor serial # to you.  Billing and Patient Assistance Program Information  We have supplied [employer] with any of your insurance information on file for billing purposes. [employer] offers a sliding scale Patient Assistance Program for patients that do not have  insurance, or whose insurance does not completely cover the cost of the ZIO monitor.  You must apply for the Patient Assistance Program to qualify for this discounted rate.  To apply, please call [employer] at [phone], select  option 4, select option 2, ask to apply for  Patient Assistance Program. Theodore Demark will ask your household income, and how many people  are in your household. They will quote your out-of-pocket cost based on that information.  [employer] will also be able to set up a 40-month, interest-free payment plan if needed.  Applying the monitor   Shave hair from upper left chest.  Hold abrader disc by orange tab. Rub abrader in 40 strokes over the upper left chest as  indicated in your monitor instructions.  Clean area with 4 enclosed alcohol pads. Let dry.  Apply patch as indicated in monitor instructions. Patch will be placed under collarbone on left  side of chest with arrow pointing upward.  Rub patch adhesive wings for 2 minutes. Remove white label marked "1". Remove the white  label marked "2". Rub patch adhesive wings for 2 additional minutes.  While looking in a mirror, press and release button in center of patch. A small green light will  flash 3-4 times. This will be your only indicator that the monitor has been turned on.  Do not shower for the first 24 hours. You may shower after the first 24 hours.  Press the button if you feel a symptom. You will hear a small click. Record Date, Time and  Symptom in the Patient Logbook.  When you are ready to remove the patch, follow instructions on the last 2 pages of Patient  Logbook. Stick patch monitor onto the last page of Patient Logbook.  Place Patient Logbook  in the blue and white box. Use locking tab on box and tape box closed  securely. The blue and white box has prepaid postage on it. Please place it in the mailbox as  soon as possible. Your physician should have your test results approximately 7 days after the  monitor has been mailed back to [facility].  Call [place] at [phone] if you have questions regarding  your ZIO XT patch monitor. Call them immediately if you see an orange light blinking on your  monitor.   If your monitor falls off in less than 4 days, contact our Monitor department at [phone].  If your monitor becomes loose or falls off after 4 days call [employer] at [phone] for  suggestions on securing your monitor    Follow-Up: At [facility], you and your health needs are our priority.  As part of our continuing mission to provide you with exceptional heart care, we have created designated Provider Care Teams.  These Care Teams include your primary Cardiologist (physician) and Advanced Practice Providers (APPs -  Physician Assistants and Nurse Practitioners) who all work together to provide you with the care you need, when you need it.  We recommend signing up for the patient portal called "MyChart".  Sign up information is provided on this After Visit Summary.  MyChart is used to connect with patients for Virtual Visits (Telemedicine).  Patients are able to view lab/test results, encounter notes, upcoming appointments, etc.  Non-urgent messages can be sent to your provider as well.   To learn more about what you can do with MyChart, go to [email or web address].    Your next appointment:    As needed  The format for your next appointment:   In Person  Provider:   Berniece Salines, DO [address], [place], [place] [address]    Other Instructions

## 2021-01-19 NOTE — Progress Notes (Unsigned)
Patient enrolled for [employer] to mail a 14 day ZIO XT monitor to his address on file.

## 2021-01-20 ENCOUNTER — Other Ambulatory Visit: Payer: Self-pay | Admitting: Primary Care

## 2021-01-20 DIAGNOSIS — R066 Hiccough: Secondary | ICD-10-CM

## 2021-01-20 NOTE — Telephone Encounter (Signed)
You declined 4 days ago.

## 2021-01-22 DIAGNOSIS — R42 Dizziness and giddiness: Secondary | ICD-10-CM | POA: Diagnosis not present

## 2021-02-02 NOTE — Telephone Encounter (Signed)
Please call patient

## 2021-02-02 NOTE — Telephone Encounter (Signed)
Spoke to patient by telephone and was advised that the neck pain has been going on for several months and it is nothing new. Patient stated that he fell several months ago and Allie Bossier NP is aware of what is going on with him. Patient stated that he was just actually sending Anda Kraft a message to keep her in the loop. Patient stated that he just wanted to know from Anda Kraft if there is anything else that he should do prior to his appointment with the neurosurgeon. Patient appreciated the call to check on him.

## 2021-02-04 ENCOUNTER — Ambulatory Visit (INDEPENDENT_AMBULATORY_CARE_PROVIDER_SITE_OTHER): Payer: Medicare HMO | Admitting: Primary Care

## 2021-02-04 ENCOUNTER — Ambulatory Visit (INDEPENDENT_AMBULATORY_CARE_PROVIDER_SITE_OTHER)
Admission: RE | Admit: 2021-02-04 | Discharge: 2021-02-04 | Disposition: A | Discharge status: Home / Self Care | Payer: Medicare HMO | Source: Ambulatory Visit | Attending: Primary Care | Admitting: Primary Care

## 2021-02-04 ENCOUNTER — Other Ambulatory Visit: Payer: Self-pay

## 2021-02-04 ENCOUNTER — Encounter: Payer: Self-pay | Admitting: Primary Care

## 2021-02-04 VITALS — BP 142/70 | HR 70 | Temp 97.9°F | Ht 68.0 in | Wt 235.0 lb

## 2021-02-04 DIAGNOSIS — M4312 Spondylolisthesis, cervical region: Secondary | ICD-10-CM | POA: Diagnosis not present

## 2021-02-04 DIAGNOSIS — E039 Hypothyroidism, unspecified: Secondary | ICD-10-CM | POA: Diagnosis not present

## 2021-02-04 DIAGNOSIS — M542 Cervicalgia: Secondary | ICD-10-CM

## 2021-02-04 DIAGNOSIS — R112 Nausea with vomiting, unspecified: Secondary | ICD-10-CM

## 2021-02-04 DIAGNOSIS — F1020 Alcohol dependence, uncomplicated: Secondary | ICD-10-CM | POA: Insufficient documentation

## 2021-02-04 DIAGNOSIS — R197 Diarrhea, unspecified: Secondary | ICD-10-CM | POA: Diagnosis not present

## 2021-02-04 DIAGNOSIS — R600 Localized edema: Secondary | ICD-10-CM | POA: Diagnosis not present

## 2021-02-04 DIAGNOSIS — G8929 Other chronic pain: Secondary | ICD-10-CM

## 2021-02-04 DIAGNOSIS — M1A9XX0 Chronic gout, unspecified, without tophus (tophi): Secondary | ICD-10-CM

## 2021-02-04 DIAGNOSIS — M47812 Spondylosis without myelopathy or radiculopathy, cervical region: Secondary | ICD-10-CM | POA: Diagnosis not present

## 2021-02-04 DIAGNOSIS — R69 Illness, unspecified: Secondary | ICD-10-CM | POA: Diagnosis not present

## 2021-02-04 DIAGNOSIS — I6529 Occlusion and stenosis of unspecified carotid artery: Secondary | ICD-10-CM | POA: Diagnosis not present

## 2021-02-04 DIAGNOSIS — R269 Unspecified abnormalities of gait and mobility: Secondary | ICD-10-CM

## 2021-02-04 LAB — URIC ACID: Uric Acid, Serum: 4.1 mg/dL (ref 4.0–7.8)

## 2021-02-04 LAB — BASIC METABOLIC PANEL
BUN: 28 mg/dL — ABNORMAL HIGH (ref 6–23)
CO2: 32 mEq/L (ref 19–32)
Calcium: 9.4 mg/dL (ref 8.4–10.5)
Chloride: 100 mEq/L (ref 96–112)
Creatinine, Ser: 1.84 mg/dL — ABNORMAL HIGH (ref 0.40–1.50)
GFR: 36.35 mL/min — ABNORMAL LOW (ref >60.00)
Glucose, Bld: 89 mg/dL (ref 70–99)
Potassium: 4.3 mEq/L (ref 3.5–5.1)
Sodium: 143 mEq/L (ref 135–145)

## 2021-02-04 LAB — CBC WITH DIFFERENTIAL/PLATELET
Basophils Absolute: 0 10*3/uL (ref 0.0–0.1)
Basophils Relative: 1.2 % (ref 0.0–3.0)
Eosinophils Absolute: 0.1 10*3/uL (ref 0.0–0.7)
Eosinophils Relative: 2.9 % (ref 0.0–5.0)
HCT: 40.5 % (ref 39.0–52.0)
Hemoglobin: 13.4 g/dL (ref 13.0–17.0)
Lymphocytes Relative: 30.2 % (ref 12.0–46.0)
Lymphs Abs: 1.2 10*3/uL (ref 0.7–4.0)
MCHC: 33 g/dL (ref 30.0–36.0)
MCV: 106.3 fl — ABNORMAL HIGH (ref 78.0–100.0)
Monocytes Absolute: 0.3 10*3/uL (ref 0.1–1.0)
Monocytes Relative: 6.3 % (ref 3.0–12.0)
Neutro Abs: 2.4 10*3/uL (ref 1.4–7.7)
Neutrophils Relative %: 59.4 % (ref 43.0–77.0)
Platelets: 134 10*3/uL — ABNORMAL LOW (ref 150.0–400.0)
RBC: 3.81 Mil/uL — ABNORMAL LOW (ref 4.22–5.81)
RDW: 16.1 % — ABNORMAL HIGH (ref 11.5–15.5)
WBC: 4.1 10*3/uL (ref 4.0–10.5)

## 2021-02-04 LAB — TSH: TSH: 3.44 u[IU]/mL (ref 0.35–5.50)

## 2021-02-04 MED ORDER — PREDNISONE 20 MG PO TABS: ORAL_TABLET | ORAL | 0 refills | Status: DC

## 2021-02-04 NOTE — Assessment & Plan Note (Signed)
S/P trauma in May 2022, however, there was no imaging of the neck completed during his hospital stay.  Xray of the cervical spine ordered and pending.  Discussed use of baclofen for which he has at home.  Rx for prednisone course sent to pharmacy. Await results.

## 2021-02-04 NOTE — Assessment & Plan Note (Signed)
Repeat uric acid level due and pending. Continue allopurinol 300 mg.

## 2021-02-04 NOTE — Assessment & Plan Note (Signed)
Recently began again with N/V/D symptoms. Stable today in office.  Follow up with neurology as scheduled.  Question parkinson's disease? Await notes.

## 2021-02-04 NOTE — Progress Notes (Signed)
Subjective:    Patient ID: Devin Howell, male    DOB: Oct 02, 1950, 70 y.o.   MRN: [id number]  HPI  Devin Howell is a very pleasant 70 y.o. male with a history of who presents today   1) Neck Pain: Chronic to the bilateral upper to mid neck for which he describes as a stiffness. Pain began after his traumatic fall in May 2022 which caused a trace subdural and subarachnoid hemorrhage. He's also noticed right lateral upper extremity numbness that begins to the right upper neck through right lateal elbow. This began around the same time. He was admitted in May 2022, underwent numerous scans of which did not include cervical spine.   He's not taken anything OTC for symptoms. The patient endorses his neck pain is about the same as it was in May, his wife says his pain appears worse.   2) Nausea/Vomiting/Diarrhea: Chronic and intermittent since May 2022 with one hospitalization, and now three episodes since. He underwent endoscopy in early September 2022 which revealed non bleeding gastric ulcers.   His most recent episode of N/V/D began about one day ago with symptoms nausea, vomiting, diarrhea, speech articulation, ankle edema. These are the same symptoms that accompany his typical episodes. He's had four episodes of vomiting and 2 episodes of soft diarrhea over the last 24 hours. He has not taken any Zofran.  He is drinking two alcoholic (liquor) drinks per day which is a reduction from four previously.  He has an appointment scheduled with Neurology in December. He denies unilateral weakness, blurred vision, dizziness, abdominal pain, bloody stools. He also denies any medication changes, food changes, or new supplements prior to May 2022.  He is drinking some water, little to no appetite. His urine is dark yellow.    Review of Systems  Constitutional:  Negative for fever.  Eyes:  Negative for visual disturbance.  Gastrointestinal:  Positive for diarrhea, nausea and vomiting.  Negative for abdominal pain and blood in stool.  Genitourinary:  Negative for difficulty urinating.  Musculoskeletal:  Positive for arthralgias, joint swelling, myalgias, neck pain and neck stiffness.  Neurological:  Positive for speech difficulty and numbness. Negative for dizziness and headaches.        Past Medical History:  Diagnosis Date   Cataract    Chronic gout    CKD (chronic kidney disease)    pt. denies   Erectile dysfunction    GERD (gastroesophageal reflux disease)    Herpes simplex    Hx of adenomatous polyp of colon 04/12/2017   Hyperlipidemia    pt. denies   Hypertension    pt. denies   OSA on CPAP    Sleep apnea    cpap   Subarachnoid hematoma (HCC)    Subdural hematoma (HCC)    Testosterone deficiency     Social History   Socioeconomic History   Marital status: Married    Spouse name: Not on file   Number of children: Not on file   Years of education: Not on file   Highest education level: Not on file  Occupational History   Not on file  Tobacco Use   Smoking status: Never    Passive exposure: Never   Smokeless tobacco: Never  Vaping Use   Vaping Use: Never used  Substance and Sexual Activity   Alcohol use: Yes    Alcohol/week: 2.0 standard drinks    Types: 2 Glasses of wine per week   Drug use: No   Sexual  activity: Not Currently  Other Topics Concern   Not on file  Social History Narrative   Not on file   Social Determinants of Health   Financial Resource Strain: Low Risk    Difficulty of Paying Living Expenses: Not hard at all  Food Insecurity: No Food Insecurity   Worried About Charity fundraiser in the Last Year: Never true   [place] in the Last Year: Never true  Transportation Needs: No Transportation Needs   Lack of Transportation (Medical): No   Lack of Transportation (Non-Medical): No  Physical Activity: Sufficiently Active   Days of Exercise per Week: 4 days   Minutes of Exercise per Session: 60 min  Stress: No  Stress Concern Present   Feeling of Stress : Not at all  Social Connections: Not on file  Intimate Partner Violence: Not At Risk   Fear of Current or Ex-Partner: No   Emotionally Abused: No   Physically Abused: No   Sexually Abused: No    Past Surgical History:  Procedure Laterality Date   COLONOSCOPY     TONSILLECTOMY AND ADENOIDECTOMY  1960    Family History  Problem Relation Age of Onset   Rectal cancer Mother    Colon polyps Neg Hx    Esophageal cancer Neg Hx    Colon cancer Neg Hx    Stomach cancer Neg Hx     No Known Allergies  Current Outpatient Medications on File Prior to Visit  Medication Sig Dispense Refill   allopurinol (ZYLOPRIM) 300 MG tablet TAKE 1 TABLET (300 MG TOTAL) BY MOUTH DAILY. FOR GOUT PREVENTION. 90 tablet 2   atorvastatin (LIPITOR) 20 MG tablet Take 1 tablet (20 mg total) by mouth daily. For cholesterol. 90 tablet 2   baclofen (LIORESAL) 10 MG tablet Take 1 tablet (10 mg total) by mouth 3 (three) times daily. As needed for hiccups 90 tablet 0   citalopram (CELEXA) 20 MG tablet TAKE 1 TABLET (20 MG TOTAL) BY MOUTH DAILY. FOR ANXIETY AND DEPRESSION. 90 tablet 0   levothyroxine (SYNTHROID) 50 MCG tablet Take 1 tablet by mouth every morning on an empty stomach with water only.  No food or other medications for 30 minutes. (Patient taking differently: Take 50 mcg by mouth See admin instructions. Take 1 tablet by mouth every morning on an empty stomach with water only.  No food or other medications for 30 minutes.) 90 tablet 2   metoprolol succinate (TOPROL-XL) 25 MG 24 hr tablet Take 25 mg by mouth daily.     ondansetron (ZOFRAN ODT) 4 MG disintegrating tablet Take 1 tablet (4 mg total) by mouth every 8 (eight) hours as needed for nausea or vomiting. 20 tablet 0   pantoprazole (PROTONIX) 40 MG tablet Take 1 tablet (40 mg total) by mouth 2 (two) times daily. 180 tablet 1   tamsulosin (FLOMAX) 0.4 MG CAPS capsule Take 0.4 mg by mouth at bedtime.      testosterone cypionate (DEPOTESTOSTERONE CYPIONATE) 200 MG/ML injection Inject 100 mg into the muscle every 28 (twenty-eight) days.     No current facility-administered medications on file prior to visit.    BP (!) 142/70 (BP Location: Right Arm, Patient Position: Sitting, Cuff Size: Large)   Pulse 70   Temp 97.9 F (36.6 C) (Temporal)   Ht 5\' 8"  (1.727 m)   Wt 235 lb (106.6 kg)   SpO2 97%   BMI 35.73 kg/m  Objective:   Physical Exam Eyes:  Extraocular Movements: Extraocular movements intact.  Neck:     Comments: Decrease in ROM and pain with all planes of movement. 5/5 strength to bilateral upper extremities. Negative empty can test.  Cardiovascular:     Rate and Rhythm: Normal rate and regular rhythm.     Comments: Bilateral ankle edema noted Pulmonary:     Effort: Pulmonary effort is normal.     Breath sounds: Normal breath sounds. No wheezing or rales.  Abdominal:     General: Bowel sounds are normal.     Palpations: Abdomen is soft.     Tenderness: There is no abdominal tenderness.  Musculoskeletal:     Cervical back: Neck supple. Pain with movement present. No spinous process tenderness. Decreased range of motion.  Skin:    General: Skin is warm and dry.  Neurological:     Mental Status: He is alert and oriented to person, place, and time.     Cranial Nerves: No cranial nerve deficit.     Motor: No weakness.     Gait: Gait abnormal.     Comments: Mild shuffling gait          Assessment & Plan:      This visit occurred during the SARS-CoV-2 public health emergency.  Safety protocols were in place, including screening questions prior to the visit, additional usage of staff PPE, and extensive cleaning of exam room while observing appropriate contact time as indicated for disinfecting solutions.

## 2021-02-04 NOTE — Assessment & Plan Note (Signed)
Last TSH from May 2022 slightly elevated. Repeat TSH pending.

## 2021-02-04 NOTE — Patient Instructions (Signed)
Stop by the lab and xray prior to leaving today. I will notify you of your results once received.   Start prednisone for neck pain and numbness down the right arm. Take 3 tablets by mouth once daily for 2 days, then 2 tablets for 3 days, then 1 tablet for 3 days.  Try taking your baclofen for muscle stiffness and pain.  Take your ondansetron (Zofran) as needed for nausea.   I'll be in touch again soon!

## 2021-02-04 NOTE — Assessment & Plan Note (Signed)
Noted to ankles today. Checking uric acid level.  BNP from one month ago reviewed.

## 2021-02-04 NOTE — Assessment & Plan Note (Signed)
Recurring, no obvious cause for symptoms. Reviewed endoscopy from September 2022.  Today he appears stable. Will check BMP and CBC.  Discussed to start Zofran PRN. Also stressed the importance of hydration with water.  He will see GI next week.   Return precautions provided.

## 2021-02-04 NOTE — Assessment & Plan Note (Signed)
Commended him on reducing his alcoholic beverages.  Recommended he discontinue on several prior visits.

## 2021-02-08 ENCOUNTER — Other Ambulatory Visit: Payer: Self-pay | Admitting: Primary Care

## 2021-02-08 DIAGNOSIS — E039 Hypothyroidism, unspecified: Secondary | ICD-10-CM

## 2021-02-11 ENCOUNTER — Ambulatory Visit: Payer: Medicare HMO | Admitting: Internal Medicine

## 2021-02-11 ENCOUNTER — Encounter: Payer: Self-pay | Admitting: Internal Medicine

## 2021-02-11 VITALS — BP 142/80 | HR 68 | Ht 68.0 in | Wt 241.0 lb

## 2021-02-11 DIAGNOSIS — F1020 Alcohol dependence, uncomplicated: Secondary | ICD-10-CM | POA: Diagnosis not present

## 2021-02-11 DIAGNOSIS — K259 Gastric ulcer, unspecified as acute or chronic, without hemorrhage or perforation: Secondary | ICD-10-CM | POA: Diagnosis not present

## 2021-02-11 DIAGNOSIS — R197 Diarrhea, unspecified: Secondary | ICD-10-CM | POA: Diagnosis not present

## 2021-02-11 DIAGNOSIS — R112 Nausea with vomiting, unspecified: Secondary | ICD-10-CM

## 2021-02-11 DIAGNOSIS — G319 Degenerative disease of nervous system, unspecified: Secondary | ICD-10-CM

## 2021-02-11 DIAGNOSIS — R69 Illness, unspecified: Secondary | ICD-10-CM | POA: Diagnosis not present

## 2021-02-11 DIAGNOSIS — R42 Dizziness and giddiness: Secondary | ICD-10-CM | POA: Diagnosis not present

## 2021-02-11 MED ORDER — PANTOPRAZOLE SODIUM 20 MG PO TBEC: 20.0000 mg | DELAYED_RELEASE_TABLET | Freq: Every day | ORAL | 3 refills | Status: DC

## 2021-02-11 NOTE — Patient Instructions (Signed)
Decrease your pantoprazole to one a day and then when you finish your supply take the rx you have been given today and fill it for the pantoprazole 20mg  tablets.   Decrease you alcohol intake as discussed.   I appreciate the opportunity to care for you. Silvano Rusk, MD, [facility]

## 2021-02-11 NOTE — Progress Notes (Signed)
Devin Howell 70 y.o. 27-Oct-1950 [id number]  Assessment & Plan:   Encounter Diagnoses  Name Primary?   Multiple gastric ulcers Yes   Uncomplicated alcohol dependence (HCC)    Cerebral atrophy (HCC)    Cerebellar atrophy (HCC)    Nausea and vomiting,episodic    Regarding his multiple gastric ulcers question if this is related to alcohol.  I do not think repeat EGD is necessary.  My plan is for him to reduce to 40 mg daily on the pantoprazole now and when he runs out of that stay on 20 mg daily.  He has cut down on his alcohol consumption but it is my recommendation that he eliminate this.  He should do this over time as he has alcohol dependence.  He may need some professional help with this.  Defer to primary care or others.  Is episodic nausea and vomiting may be related to alcohol and brain damage.  He has both cerebral and cerebellar atrophy.  He has some dizziness but not vertigo.  Agree with neurology evaluation.  I do not think there is a GI etiology for these problems.    He will follow-up with GI as needed at this point.  CC: Pleas Koch, NP     Subjective:   Chief Complaint:  HPI Patient is a 70 year old white man here for a follow-up visit after he was found to have multiple gastric ulcers all with benign biopsies on EGD in September, performed by Dr. Tarri Glenn.  He had come to [place] with problems of episodic nausea and vomiting.  He was hospitalized in May with nausea vomiting and diarrhea.  I have reviewed that admission.  He had 1 or 2 more episodes of this that were self-limited treated with ondansetron prescribed through primary care.  He was set up for an EGD after he saw [place] Ellouise Newer, PA-C) and that is where the multiple gastric ulcers were seen in all small.  Mild reflux changes and biopsies of the Z-line.  Started on pantoprazole 40 mg twice daily and he remains on that.  However he has had multiple episodes of nausea and vomiting since then.   They seem to come at about every 6 weeks.  Not really having diarrhea.  When the episodes come on he is able to quell them and keep them at 1 or 2 days of symptoms by using antiemetics.  When he was hospitalized in May he had CT of the head CT of the orbits CT abdomen pelvis all of which were unrevealing though there was a trace subdural hematoma and subarachnoid hemorrhage noted and a midline scalp hematoma and or laceration.  He had fallen with that admission.  Main abnormality on abdominal imaging was fatty liver.  He had some abnormal transaminases.   He went back to the ER in September and had an alcohol level of 212.  Subsequently he has reduced alcohol and is now drinking "only 2 drinks with evening meal".  "I have cut way back".  MR of the brain in September on the 25th demonstrated both cerebral and cerebellar atrophy.  He saw [place] back in October on the 19th (Jennifer Lemmon, [place]).  At which point he was improved.  He went back to primary care after that with some dizziness and the MRI was ordered as above.  He went to cardiology for dizziness in October.  He has an appointment with neurology pending for the dizziness and some gait disturbances that he has had.  He tells me he has some dizziness when he gets these episodes but no vertigo.    Note that LFTs normalized on follow-up in September.   No Known Allergies Current Meds  Medication Sig   allopurinol (ZYLOPRIM) 300 MG tablet TAKE 1 TABLET (300 MG TOTAL) BY MOUTH DAILY. FOR GOUT PREVENTION.   atorvastatin (LIPITOR) 20 MG tablet Take 1 tablet (20 mg total) by mouth daily. For cholesterol.   baclofen (LIORESAL) 10 MG tablet Take 1 tablet (10 mg total) by mouth 3 (three) times daily. As needed for hiccups   citalopram (CELEXA) 20 MG tablet TAKE 1 TABLET (20 MG TOTAL) BY MOUTH DAILY. FOR ANXIETY AND DEPRESSION.   levothyroxine (SYNTHROID) 50 MCG tablet 1 TAB BY MOUTH EVERY AM ON EMPTY STOMACH WITH WATER ONLY NO FOOD/OTHER MEDS FOR 30  MINS   metoprolol succinate (TOPROL-XL) 25 MG 24 hr tablet Take 25 mg by mouth daily.   ondansetron (ZOFRAN ODT) 4 MG disintegrating tablet Take 1 tablet (4 mg total) by mouth every 8 (eight) hours as needed for nausea or vomiting.   pantoprazole (PROTONIX) 40 MG tablet Take 1 tablet (40 mg total) by mouth 2 (two) times daily.   tamsulosin (FLOMAX) 0.4 MG CAPS capsule Take 0.4 mg by mouth at bedtime.   testosterone cypionate (DEPOTESTOSTERONE CYPIONATE) 200 MG/ML injection Inject 100 mg into the muscle every 28 (twenty-eight) days.   Past Medical History:  Diagnosis Date   Cataract    Chronic gout    CKD (chronic kidney disease)    pt. denies   Erectile dysfunction    GERD (gastroesophageal reflux disease)    Herpes simplex    Hx of adenomatous polyp of colon 04/12/2017   Hyperlipidemia    pt. denies   Hypertension    pt. denies   OSA on CPAP    Sleep apnea    cpap   Subarachnoid hematoma (HCC)    Subdural hematoma (HCC)    Testosterone deficiency    Past Surgical History:  Procedure Laterality Date   COLONOSCOPY     ESOPHAGOGASTRODUODENOSCOPY     TONSILLECTOMY AND ADENOIDECTOMY  1960   Social History   Social History Narrative   Not on file   family history includes Rectal cancer in his mother.   Review of Systems As per HPI  Objective:   Physical Exam BP (!) 142/80   Pulse 68   Ht 5\' 8"  (1.727 m)   Wt 241 lb (109.3 kg)   BMI 36.64 kg/m  Lungs are clear Heart sounds are normal The abdomen is obese there is a small subcutaneous nodule in the right upper quadrant but I cannot detect any stigmata of liver disease or hepatosplenomegaly.

## 2021-02-12 ENCOUNTER — Other Ambulatory Visit: Payer: Self-pay | Admitting: Primary Care

## 2021-02-12 DIAGNOSIS — F32A Depression, unspecified: Secondary | ICD-10-CM

## 2021-02-12 DIAGNOSIS — F419 Anxiety disorder, unspecified: Secondary | ICD-10-CM

## 2021-02-12 NOTE — Telephone Encounter (Signed)
You discontinued this under "error", he is taking this then? Will you double check?

## 2021-02-18 MED ORDER — TRAZODONE HCL 50 MG PO TABS: 50.0000 mg | ORAL_TABLET | Freq: Every day | ORAL | 0 refills | Status: DC

## 2021-02-18 NOTE — Telephone Encounter (Signed)
Patient restarted about 2 weeks ago. Is needing refill. Patient states that he started having trouble falling asleep. He is taking every night. He has enough to last until Anda Kraft is back in the office next week. He has 7 pills left. Medication does help with symptoms with no side effects.

## 2021-02-18 NOTE — Telephone Encounter (Signed)
Requested Prescriptions   Signed Prescriptions Disp Refills   traZODone (DESYREL) 50 MG tablet 90 tablet 0    Sig: Take 1 tablet (50 mg total) by mouth at bedtime. For sleep.    Authorizing Provider: Pleas Koch   Refill(s) sent to pharmacy.

## 2021-02-26 ENCOUNTER — Other Ambulatory Visit: Payer: Self-pay

## 2021-02-26 ENCOUNTER — Encounter: Payer: Self-pay | Admitting: Primary Care

## 2021-02-26 ENCOUNTER — Ambulatory Visit (INDEPENDENT_AMBULATORY_CARE_PROVIDER_SITE_OTHER): Payer: Medicare HMO | Admitting: Primary Care

## 2021-02-26 VITALS — BP 150/80 | HR 72 | Temp 98.2°F | Ht 65.75 in | Wt 231.1 lb

## 2021-02-26 DIAGNOSIS — E349 Endocrine disorder, unspecified: Secondary | ICD-10-CM

## 2021-02-26 DIAGNOSIS — R112 Nausea with vomiting, unspecified: Secondary | ICD-10-CM | POA: Diagnosis not present

## 2021-02-26 DIAGNOSIS — Z860101 Personal history of adenomatous and serrated colon polyps: Secondary | ICD-10-CM

## 2021-02-26 DIAGNOSIS — F419 Anxiety disorder, unspecified: Secondary | ICD-10-CM

## 2021-02-26 DIAGNOSIS — E7800 Pure hypercholesterolemia, unspecified: Secondary | ICD-10-CM | POA: Diagnosis not present

## 2021-02-26 DIAGNOSIS — Z125 Encounter for screening for malignant neoplasm of prostate: Secondary | ICD-10-CM

## 2021-02-26 DIAGNOSIS — M1A9XX0 Chronic gout, unspecified, without tophus (tophi): Secondary | ICD-10-CM

## 2021-02-26 DIAGNOSIS — M542 Cervicalgia: Secondary | ICD-10-CM | POA: Diagnosis not present

## 2021-02-26 DIAGNOSIS — F32A Depression, unspecified: Secondary | ICD-10-CM

## 2021-02-26 DIAGNOSIS — Z0000 Encounter for general adult medical examination without abnormal findings: Secondary | ICD-10-CM

## 2021-02-26 DIAGNOSIS — I10 Essential (primary) hypertension: Secondary | ICD-10-CM | POA: Diagnosis not present

## 2021-02-26 DIAGNOSIS — E039 Hypothyroidism, unspecified: Secondary | ICD-10-CM | POA: Diagnosis not present

## 2021-02-26 DIAGNOSIS — G8929 Other chronic pain: Secondary | ICD-10-CM

## 2021-02-26 DIAGNOSIS — A6000 Herpesviral infection of urogenital system, unspecified: Secondary | ICD-10-CM

## 2021-02-26 DIAGNOSIS — R69 Illness, unspecified: Secondary | ICD-10-CM | POA: Diagnosis not present

## 2021-02-26 DIAGNOSIS — F3342 Major depressive disorder, recurrent, in full remission: Secondary | ICD-10-CM

## 2021-02-26 DIAGNOSIS — R066 Hiccough: Secondary | ICD-10-CM

## 2021-02-26 DIAGNOSIS — F1020 Alcohol dependence, uncomplicated: Secondary | ICD-10-CM

## 2021-02-26 DIAGNOSIS — R197 Diarrhea, unspecified: Secondary | ICD-10-CM

## 2021-02-26 DIAGNOSIS — Z86010 Personal history of colonic polyps: Secondary | ICD-10-CM

## 2021-02-26 DIAGNOSIS — R42 Dizziness and giddiness: Secondary | ICD-10-CM

## 2021-02-26 DIAGNOSIS — R030 Elevated blood-pressure reading, without diagnosis of hypertension: Secondary | ICD-10-CM

## 2021-02-26 LAB — LIPID PANEL
Cholesterol: 206 mg/dL — ABNORMAL HIGH (ref 0–200)
HDL: 100.6 mg/dL (ref >39.00)
LDL Cholesterol: 89 mg/dL (ref 0–99)
NonHDL: 105.61
Total CHOL/HDL Ratio: 2
Triglycerides: 83 mg/dL (ref 0.0–149.0)
VLDL: 16.6 mg/dL (ref 0.0–40.0)

## 2021-02-26 LAB — PSA, MEDICARE: PSA: 1 ng/ml (ref 0.10–4.00)

## 2021-02-26 MED ORDER — TIZANIDINE HCL 4 MG PO TABS: 4.0000 mg | ORAL_TABLET | Freq: Three times a day (TID) | ORAL | 0 refills | Status: DC | PRN

## 2021-02-26 NOTE — Assessment & Plan Note (Signed)
Immunizations up-to-date. PSA due and pending. Colonoscopy up-to-date, due 2024.  Discussed the importance of a healthy diet and regular exercise in order for weight loss, and to reduce the risk of further co-morbidity.  Exam today stable. Labs pending and also reviewed

## 2021-02-26 NOTE — Assessment & Plan Note (Signed)
Reviewed recent uric acid level which is well controlled on allopurinol 300 mg.  Continue same.

## 2021-02-26 NOTE — Assessment & Plan Note (Signed)
Doing well on citalopram 20 mg, continue same.

## 2021-02-26 NOTE — Progress Notes (Signed)
Subjective:    Patient ID: Devin Howell, male    DOB: 24-Oct-1950, 70 y.o.   MRN: [id number]  HPI  Devin Howell is a very pleasant 70 y.o. male who presents today for complete physical and follow up of chronic conditions.  He continues to notice bilateral lower neck pain with radiation down right lower humerus that began since his fall in May 2022. Decrease in ROM with pain to right upper extremity with abduction. Last visit prescribed prednisone which had no effect. Xray of cervical spine showed arthritis to lower C-spine. No fracture.   Immunizations: -Tetanus: 2018 -Influenza: Completed this season  -Covid-19: Completed 4 vaccines -Shingles: Completed Shingrix -Pneumonia: Prevnar 13 in 2016, Pneumovax in 2018  Diet: Guthrie.  Exercise: Walking 1 hour daily, five days weekly.   Eye exam: Completes annually  Dental exam: Completes semi-annually   Colonoscopy: Completed in 2019, due 2024 PSA: Due  BP Readings from Last 3 Encounters:  02/26/21 (!) 160/80  02/11/21 (!) 142/80  02/04/21 (!) 142/70   He is checking BP at home which is running 130's/80's.      Review of Systems  Constitutional:  Negative for unexpected weight change.  HENT:  Negative for rhinorrhea.   Respiratory:  Negative for cough and shortness of breath.   Cardiovascular:  Negative for chest pain.  Gastrointestinal:  Negative for constipation and diarrhea.  Genitourinary:  Negative for difficulty urinating.  Musculoskeletal:  Positive for arthralgias and neck pain.  Skin:  Negative for rash.  Allergic/Immunologic: Negative for environmental allergies.  Neurological:  Positive for headaches. Negative for dizziness and numbness.  Psychiatric/Behavioral:  The patient is not nervous/anxious.         Past Medical History:  Diagnosis Date   Cataract    Chronic gout    CKD (chronic kidney disease)    pt. denies   Erectile dysfunction    GERD (gastroesophageal reflux disease)    Herpes  simplex    Hx of adenomatous polyp of colon 04/12/2017   Hyperlipidemia    pt. denies   Hypertension    pt. denies   OSA on CPAP    Sleep apnea    cpap   Subarachnoid hematoma (HCC)    Subdural hematoma (HCC)    Testosterone deficiency     Social History   Socioeconomic History   Marital status: Married    Spouse name: Not on file   Number of children: Not on file   Years of education: Not on file   Highest education level: Not on file  Occupational History   Not on file  Tobacco Use   Smoking status: Never    Passive exposure: Never   Smokeless tobacco: Never  Vaping Use   Vaping Use: Never used  Substance and Sexual Activity   Alcohol use: Yes    Alcohol/week: 2.0 standard drinks    Types: 2 Glasses of wine per week   Drug use: No   Sexual activity: Not Currently  Other Topics Concern   Not on file  Social History Narrative   Not on file   Social Determinants of Health   Financial Resource Strain: Low Risk    Difficulty of Paying Living Expenses: Not hard at all  Food Insecurity: No Food Insecurity   Worried About Charity fundraiser in the Last Year: Never true   Ran Out of Food in the Last Year: Never true  Transportation Needs: No Transportation Needs   Lack of Transportation (  Medical): No   Lack of Transportation (Non-Medical): No  Physical Activity: Sufficiently Active   Days of Exercise per Week: 4 days   Minutes of Exercise per Session: 60 min  Stress: No Stress Concern Present   Feeling of Stress : Not at all  Social Connections: Not on file  Intimate Partner Violence: Not At Risk   Fear of Current or Ex-Partner: No   Emotionally Abused: No   Physically Abused: No   Sexually Abused: No    Past Surgical History:  Procedure Laterality Date   COLONOSCOPY     ESOPHAGOGASTRODUODENOSCOPY     TONSILLECTOMY AND ADENOIDECTOMY  1960    Family History  Problem Relation Age of Onset   Rectal cancer Mother    Colon polyps Neg Hx    Esophageal  cancer Neg Hx    Colon cancer Neg Hx    Stomach cancer Neg Hx     No Known Allergies  Current Outpatient Medications on File Prior to Visit  Medication Sig Dispense Refill   allopurinol (ZYLOPRIM) 300 MG tablet TAKE 1 TABLET (300 MG TOTAL) BY MOUTH DAILY. FOR GOUT PREVENTION. 90 tablet 2   atorvastatin (LIPITOR) 20 MG tablet Take 1 tablet (20 mg total) by mouth daily. For cholesterol. 90 tablet 2   baclofen (LIORESAL) 10 MG tablet Take 1 tablet (10 mg total) by mouth 3 (three) times daily. As needed for hiccups 90 tablet 0   citalopram (CELEXA) 20 MG tablet TAKE 1 TABLET (20 MG TOTAL) BY MOUTH DAILY. FOR ANXIETY AND DEPRESSION. 90 tablet 0   levothyroxine (SYNTHROID) 50 MCG tablet 1 TAB BY MOUTH EVERY AM ON EMPTY STOMACH WITH WATER ONLY NO FOOD/OTHER MEDS FOR 30 MINS 90 tablet 0   metoprolol succinate (TOPROL-XL) 25 MG 24 hr tablet Take 25 mg by mouth daily.     ondansetron (ZOFRAN ODT) 4 MG disintegrating tablet Take 1 tablet (4 mg total) by mouth every 8 (eight) hours as needed for nausea or vomiting. 20 tablet 0   pantoprazole (PROTONIX) 20 MG tablet Take 1 tablet (20 mg total) by mouth daily before breakfast. 90 tablet 3   pantoprazole (PROTONIX) 40 MG tablet Take 1 tablet (40 mg total) by mouth daily. 180 tablet 1   tamsulosin (FLOMAX) 0.4 MG CAPS capsule Take 0.4 mg by mouth at bedtime.     testosterone cypionate (DEPOTESTOSTERONE CYPIONATE) 200 MG/ML injection Inject 100 mg into the muscle every 28 (twenty-eight) days.     traZODone (DESYREL) 50 MG tablet Take 1 tablet (50 mg total) by mouth at bedtime. For sleep. 90 tablet 0   No current facility-administered medications on file prior to visit.    There were no vitals taken for this visit. Objective:   Physical Exam HENT:     Right Ear: Tympanic membrane and ear canal normal.     Left Ear: Tympanic membrane and ear canal normal.     Nose: Nose normal.     Right Sinus: No maxillary sinus tenderness or frontal sinus tenderness.      Left Sinus: No maxillary sinus tenderness or frontal sinus tenderness.  Eyes:     Conjunctiva/sclera: Conjunctivae normal.  Neck:     Thyroid: No thyromegaly.     Vascular: No carotid bruit.   Cardiovascular:     Rate and Rhythm: Normal rate and regular rhythm.     Heart sounds: Normal heart sounds.  Pulmonary:     Effort: Pulmonary effort is normal.     Breath sounds: Normal  breath sounds. No wheezing or rales.  Abdominal:     General: Bowel sounds are normal.     Palpations: Abdomen is soft.     Tenderness: There is no abdominal tenderness.  Musculoskeletal:     Left upper arm: No tenderness.       Arms:     Cervical back: Neck supple. Pain with movement present. No spinous process tenderness or muscular tenderness. Decreased range of motion.     Comments: Pain to right lateral humerus region of upper extremity with abduction.  Skin:    General: Skin is warm and dry.  Neurological:     Mental Status: He is alert and oriented to person, place, and time.     Cranial Nerves: No cranial nerve deficit.     Deep Tendon Reflexes: Reflexes are normal and symmetric.  Psychiatric:        Mood and Affect: Mood normal.          Assessment & Plan:      This visit occurred during the SARS-CoV-2 public health emergency.  Safety protocols were in place, including screening questions prior to the visit, additional usage of staff PPE, and extensive cleaning of exam room while observing appropriate contact time as indicated for disinfecting solutions.

## 2021-02-26 NOTE — Assessment & Plan Note (Signed)
Chronic and continued.  Evaluated by cardiology, myself, GI. Negative work-up thus far.  Neurology evaluation pending.

## 2021-02-26 NOTE — Assessment & Plan Note (Signed)
Compliant to atorvastatin 20 mg, continue same. Repeat lipid panel pending.

## 2021-02-26 NOTE — Assessment & Plan Note (Signed)
X-rays of cervical spine with moderate arthritis, no fracture.  Referral placed to orthopedic surgery for evaluation. Prescription for tizanidine to use as needed provided.  Drowsiness precautions.

## 2021-02-26 NOTE — Assessment & Plan Note (Signed)
Following with urology. Continue testosterone 100 mg monthly.  PSA pending.

## 2021-02-26 NOTE — Assessment & Plan Note (Signed)
Intermittent, stable. Continue baclofen 10 mg as needed

## 2021-02-26 NOTE — Assessment & Plan Note (Signed)
Continues to drink 2 drinks daily which is about half from what he was doing.  It has been recommended that he discontinue alcohol given his and ongoing dizziness recent symptoms.

## 2021-02-26 NOTE — Assessment & Plan Note (Signed)
Colonoscopy up-to-date, due in 2024.

## 2021-02-26 NOTE — Assessment & Plan Note (Signed)
He is taking levothyroxine correctly. Continue levothyroxine 50 mcg. Recent TSH reviewed.

## 2021-02-26 NOTE — Assessment & Plan Note (Signed)
No concerns mentioned today.

## 2021-02-26 NOTE — Assessment & Plan Note (Addendum)
Noted again today and on several office visits. Home blood pressure readings are much better.  Discussed to notify if readings at home are consistently at or above 140/90

## 2021-02-26 NOTE — Assessment & Plan Note (Signed)
Denies concerns today, endorses doing well on citalopram 20 mg.  Continue same.

## 2021-02-26 NOTE — Assessment & Plan Note (Signed)
Following with nephrology. Recent BMP reviewed.

## 2021-02-26 NOTE — Patient Instructions (Addendum)
Stop by the lab prior to leaving today. I will notify you of your results once received.   You may take the tizanidine muscle relaxer every 8 hours as needed for neck pain. This may cause drowsiness.  You will be contacted regarding your referral to the orthopedist.  Please let us know if you have not been contacted within two weeks.   It was a pleasure to see you today!  Preventive Care 13 Years and Older, Male Preventive care refers to lifestyle choices and visits with your health care provider that can promote health and wellness. Preventive care visits are also called wellness exams. What can I expect for my preventive care visit? Counseling During your preventive care visit, your health care provider may ask about your: Medical history, including: Past medical problems. Family medical history. History of falls. Current health, including: Emotional well-being. Home life and relationship well-being. Sexual activity. Memory and ability to understand (cognition). Lifestyle, including: Alcohol, nicotine or tobacco, and drug use. Access to firearms. Diet, exercise, and sleep habits. Work and work Statistician. Sunscreen use. Safety issues such as seatbelt and bike helmet use. Physical exam Your health care provider will check your: Height and weight. These may be used to calculate your BMI (body mass index). BMI is a measurement that tells if you are at a healthy weight. Waist circumference. This measures the distance around your waistline. This measurement also tells if you are at a healthy weight and may help predict your risk of certain diseases, such as type 2 diabetes and high blood pressure. Heart rate and blood pressure. Body temperature. Skin for abnormal spots. What immunizations do I need? Vaccines are usually given at various ages, according to a schedule. Your health care provider will recommend vaccines for you based on your age, medical history, and lifestyle or other  factors, such as travel or where you work. What tests do I need? Screening Your health care provider may recommend screening tests for certain conditions. This may include: Lipid and cholesterol levels. Diabetes screening. This is done by checking your blood sugar (glucose) after you have not eaten for a while (fasting). Hepatitis C test. Hepatitis B test. HIV (human immunodeficiency virus) test. STI (sexually transmitted infection) testing, if you are at risk. Lung cancer screening. Colorectal cancer screening. Prostate cancer screening. Abdominal aortic aneurysm (AAA) screening. You may need this if you are a current or former smoker. Talk with your health care provider about your test results, treatment options, and if necessary, the need for more tests. Follow these instructions at home: Eating and drinking  Eat a diet that includes fresh fruits and vegetables, whole grains, lean protein, and low-fat dairy products. Limit your intake of foods with high amounts of sugar, saturated fats, and salt. Take vitamin and mineral supplements as recommended by your health care provider. Do not drink alcohol if your health care provider tells you not to drink. If you drink alcohol: Limit how much you have to 0-2 drinks a day. Know how much alcohol is in your drink. In the [place] one drink equals one 12 oz bottle of beer (355 mL), one 5 oz glass of wine (148 mL), or one 1 oz glass of hard liquor (44 mL). Lifestyle Brush your teeth every morning and night with fluoride toothpaste. Floss one time each day. Exercise for at least 30 minutes 5 or more days each week. Do not use any products that contain nicotine or tobacco. These products include cigarettes, chewing tobacco, and vaping devices,  such as e-cigarettes. If you need help quitting, ask your health care provider. Do not use drugs. If you are sexually active, practice safe sex. Use a condom or other form of protection to prevent STIs. Take  aspirin only as told by your health care provider. Make sure that you understand how much to take and what form to take. Work with your health care provider to find out whether it is safe and beneficial for you to take aspirin daily. Ask your health care provider if you need to take a cholesterol-lowering medicine (statin). Find healthy ways to manage stress, such as: Meditation, yoga, or listening to music. Journaling. Talking to a trusted person. Spending time with friends and family. Safety Always wear your seat belt while driving or riding in a vehicle. Do not drive: If you have been drinking alcohol. Do not ride with someone who has been drinking. When you are tired or distracted. While texting. If you have been using any mind-altering substances or drugs. Wear a helmet and other protective equipment during sports activities. If you have firearms in your house, make sure you follow all gun safety procedures. Minimize exposure to UV radiation to reduce your risk of skin cancer. What's next? Visit your health care provider once a year for an annual wellness visit. Ask your health care provider how often you should have your eyes and teeth checked. Stay up to date on all vaccines. This information is not intended to replace advice given to you by your health care provider. Make sure you discuss any questions you have with your health care provider. Document Revised: 09/10/2020 Document Reviewed: 09/10/2020 Elsevier Patient Education  [place].

## 2021-02-26 NOTE — Assessment & Plan Note (Signed)
No recent episodes.  Evaluated by cardiology and GI, no obvious cause.  He will be reducing to pantoprazole 20 mg shortly.  Neurology evaluation pending.

## 2021-02-26 NOTE — Assessment & Plan Note (Signed)
Above goal in the office today, home readings are better and stable.  Continue metoprolol succinate 25 mg. evaluated by cardiology in October 2022.  Office notes reviewed.

## 2021-03-02 DIAGNOSIS — E291 Testicular hypofunction: Secondary | ICD-10-CM | POA: Diagnosis not present

## 2021-03-09 ENCOUNTER — Ambulatory Visit: Payer: Medicare HMO | Admitting: Diagnostic Neuroimaging

## 2021-03-09 ENCOUNTER — Encounter: Payer: Self-pay | Admitting: Diagnostic Neuroimaging

## 2021-03-09 VITALS — BP 162/88 | HR 62 | Ht 68.0 in | Wt 238.0 lb

## 2021-03-09 DIAGNOSIS — F1010 Alcohol abuse, uncomplicated: Secondary | ICD-10-CM | POA: Diagnosis not present

## 2021-03-09 DIAGNOSIS — E291 Testicular hypofunction: Secondary | ICD-10-CM | POA: Diagnosis not present

## 2021-03-09 DIAGNOSIS — R69 Illness, unspecified: Secondary | ICD-10-CM | POA: Diagnosis not present

## 2021-03-09 DIAGNOSIS — R3912 Poor urinary stream: Secondary | ICD-10-CM | POA: Diagnosis not present

## 2021-03-09 DIAGNOSIS — R251 Tremor, unspecified: Secondary | ICD-10-CM

## 2021-03-09 DIAGNOSIS — R112 Nausea with vomiting, unspecified: Secondary | ICD-10-CM

## 2021-03-09 DIAGNOSIS — N401 Enlarged prostate with lower urinary tract symptoms: Secondary | ICD-10-CM | POA: Diagnosis not present

## 2021-03-09 NOTE — Progress Notes (Signed)
[facility]  PATIENT: Devin Howell DOB: [birth year]  REFERRING CLINICIAN: Pleas Koch, NP HISTORY FROM: patient  REASON FOR VISIT: new consult    HISTORICAL  CHIEF COMPLAINT:  Chief Complaint  Patient presents with   New Patient (Initial Visit)    Rm 7 with wife- Reports he is here to discuss worsening balance. Reports he fell back in May and hit his head. Since reports episodes of h/a with associated N/V once a month. Since the fall neck/arm/shoulder pain has been more noticeable as well.     HISTORY OF PRESENT ILLNESS:   70 year old male here for evaluation of tremor, nausea, gait difficulty, dizziness.  May 2022 patient fell down backwards at home.  Apparently he had been having nausea, vomiting, diarrhea for several weeks.  Found to have traumatic subarachnoid and subdural hematoma.  Had pancytopenia, possibly related to chronic alcohol abuse.  Patient averages 3-4 drinks per day for many years.  September 2022 patient had worsening tremor, leg swelling, gait difficulty, word finding difficulty.  Patient went to the hospital for evaluation.  Alcohol level was elevated.  Patient was treated with Ativan and started on CIWA protocol.   Patient continues to have intermittent symptoms at least once a month.  Continues to drink 3-4 alcoholic drinks per day.    REVIEW OF SYSTEMS: Full 14 system review of systems performed and negative with exception of: as per HPI.  ALLERGIES: Allergies  Allergen Reactions   Tizanidine     Hallucinations     HOME MEDICATIONS: Outpatient Medications Prior to Visit  Medication Sig Dispense Refill   allopurinol (ZYLOPRIM) 300 MG tablet TAKE 1 TABLET (300 MG TOTAL) BY MOUTH DAILY. FOR GOUT PREVENTION. 90 tablet 2   atorvastatin (LIPITOR) 20 MG tablet Take 1 tablet (20 mg total) by mouth daily. For cholesterol. 90 tablet 2   baclofen (LIORESAL) 10 MG tablet Take 1 tablet (10 mg total) by mouth 3 (three) times  daily. As needed for hiccups 90 tablet 0   citalopram (CELEXA) 20 MG tablet TAKE 1 TABLET (20 MG TOTAL) BY MOUTH DAILY. FOR ANXIETY AND DEPRESSION. 90 tablet 0   levothyroxine (SYNTHROID) 50 MCG tablet 1 TAB BY MOUTH EVERY AM ON EMPTY STOMACH WITH WATER ONLY NO FOOD/OTHER MEDS FOR 30 MINS 90 tablet 0   metoprolol succinate (TOPROL-XL) 25 MG 24 hr tablet Take 25 mg by mouth daily.     ondansetron (ZOFRAN ODT) 4 MG disintegrating tablet Take 1 tablet (4 mg total) by mouth every 8 (eight) hours as needed for nausea or vomiting. 20 tablet 0   pantoprazole (PROTONIX) 20 MG tablet Take 1 tablet (20 mg total) by mouth daily before breakfast. 90 tablet 3   tamsulosin (FLOMAX) 0.4 MG CAPS capsule Take 0.4 mg by mouth at bedtime.     testosterone cypionate (DEPOTESTOSTERONE CYPIONATE) 200 MG/ML injection Inject 100 mg into the muscle every 28 (twenty-eight) days.     traZODone (DESYREL) 50 MG tablet Take 1 tablet (50 mg total) by mouth at bedtime. For sleep. 90 tablet 0   tiZANidine (ZANAFLEX) 4 MG tablet Take 1 tablet (4 mg total) by mouth every 8 (eight) hours as needed for muscle spasms. (Patient not taking: Reported on 03/09/2021) 15 tablet 0   No facility-administered medications prior to visit.    PAST MEDICAL HISTORY: Past Medical History:  Diagnosis Date   AKI (acute kidney injury) ([place]) 08/15/2020   Cataract    Chronic gout    CKD (chronic kidney  disease)    pt. denies   Erectile dysfunction    GERD (gastroesophageal reflux disease)    Herpes simplex    Hx of adenomatous polyp of colon 04/12/2017   Hyperlipidemia    pt. denies   Hypertension    pt. denies   OSA on CPAP    Sleep apnea    cpap   Subarachnoid hematoma (HCC)    Subdural hematoma (HCC)    Testosterone deficiency     PAST SURGICAL HISTORY: Past Surgical History:  Procedure Laterality Date   COLONOSCOPY     ESOPHAGOGASTRODUODENOSCOPY     TONSILLECTOMY AND ADENOIDECTOMY  1960    FAMILY HISTORY: Family History   Problem Relation Age of Onset   Rectal cancer Mother    Heart Problems Father    Colon polyps Neg Hx    Esophageal cancer Neg Hx    Colon cancer Neg Hx    Stomach cancer Neg Hx     SOCIAL HISTORY: Social History   Socioeconomic History   Marital status: Married    Spouse name: Not on file   Number of children: Not on file   Years of education: Not on file   Highest education level: Bachelor's degree (e.g., BA, AB, BS)  Occupational History   Not on file  Tobacco Use   Smoking status: Never    Passive exposure: Never   Smokeless tobacco: Never  Vaping Use   Vaping Use: Never used  Substance and Sexual Activity   Alcohol use: Yes    Alcohol/week: 2.0 standard drinks    Types: 2 Glasses of wine per week   Drug use: No   Sexual activity: Not Currently  Other Topics Concern   Not on file  Social History Narrative   Lives with wife at home    Right handed    Some caffeine intake    Social Determinants of Health   Financial Resource Strain: Low Risk    Difficulty of Paying Living Expenses: Not hard at all  Food Insecurity: No Food Insecurity   Worried About Charity fundraiser in the Last Year: Never true   Ran Out of Food in the Last Year: Never true  Transportation Needs: No Transportation Needs   Lack of Transportation (Medical): No   Lack of Transportation (Non-Medical): No  Physical Activity: Sufficiently Active   Days of Exercise per Week: 4 days   Minutes of Exercise per Session: 60 min  Stress: No Stress Concern Present   Feeling of Stress : Not at all  Social Connections: Not on file  Intimate Partner Violence: Not At Risk   Fear of Current or Ex-Partner: No   Emotionally Abused: No   Physically Abused: No   Sexually Abused: No     PHYSICAL EXAM  GENERAL EXAM/CONSTITUTIONAL: Vitals:  Vitals:   03/09/21 1139  BP: (!) 162/88  Pulse: 62  SpO2: 97%  Weight: 238 lb (108 kg)  Height: 5\' 8"  (1.727 m)   Body mass index is 36.19 kg/m. Wt  Readings from Last 3 Encounters:  03/09/21 238 lb (108 kg)  02/26/21 231 lb 1 oz (104.8 kg)  02/11/21 241 lb (109.3 kg)   Patient is in no distress; well developed, nourished and groomed; neck is supple  CARDIOVASCULAR: Examination of carotid arteries is normal; no carotid bruits Regular rate and rhythm, no murmurs Examination of peripheral vascular system by observation and palpation is normal  EYES: Ophthalmoscopic exam of optic discs and posterior segments is normal; no  papilledema or hemorrhages; MILD PROPTOSIS No results found.  MUSCULOSKELETAL: Gait, strength, tone, movements noted in Neurologic exam below  NEUROLOGIC: MENTAL STATUS:  MMSE - [place] Exam 09/15/2020 02/08/2019 12/16/2016  Orientation to time 5 5 5   Orientation to Place 5 5 5   Registration 3 3 3   Attention/ Calculation 5 5 0  Recall 3 3 3   Language- name 2 objects - - 0  Language- repeat 1 1 1   Language- follow 3 step command - - 3  Language- read & follow direction - - 0  Write a sentence - - 0  Copy design - - 0  Total score - - 20   awake, alert, oriented to person, place and time recent and remote memory intact normal attention and concentration language fluent, comprehension intact, naming intact fund of knowledge appropriate  CRANIAL NERVE:  2nd - no papilledema on fundoscopic exam 2nd, 3rd, 4th, 6th - pupils equal and reactive to light, visual fields full to confrontation, extraocular muscles intact, no nystagmus 5th - facial sensation symmetric 7th - facial strength symmetric 8th - hearing intact 9th - palate elevates symmetrically, uvula midline 11th - shoulder shrug symmetric 12th - tongue protrusion midline  MOTOR:  normal bulk and tone, full strength in the BUE, BLE  SENSORY:  normal and symmetric to light touch, temperature, vibration  COORDINATION:  finger-nose-finger, fine finger movements normal  REFLEXES:  deep tendon reflexes present and  symmetric  GAIT/STATION:  narrow based gait     DIAGNOSTIC DATA (LABS, IMAGING, TESTING) - I reviewed patient records, labs, notes, testing and imaging myself where available.  Lab Results  Component Value Date   WBC 4.1 02/04/2021   HGB 13.4 02/04/2021   HCT 40.5 02/04/2021   MCV 106.3 (H) 02/04/2021   PLT 134.0 (L) 02/04/2021      Component Value Date/Time   NA 143 02/04/2021 1131   NA 143 09/13/2017 1248   K 4.3 02/04/2021 1131   CL 100 02/04/2021 1131   CO2 32 02/04/2021 1131   GLUCOSE 89 02/04/2021 1131   BUN 28 (H) 02/04/2021 1131   BUN 15 09/13/2017 1248   CREATININE 1.84 (H) 02/04/2021 1131   CREATININE 1.39 (H) 09/15/2020 1124   CREATININE 2.94 (H) 01/20/2018 1451   CALCIUM 9.4 02/04/2021 1131   PROT 7.0 12/21/2020 1053   ALBUMIN 3.8 12/21/2020 1053   AST 33 12/21/2020 1053   AST 26 09/15/2020 1124   ALT 33 12/21/2020 1053   ALT 25 09/15/2020 1124   ALKPHOS 86 12/21/2020 1053   BILITOT 0.5 12/21/2020 1053   BILITOT 1.1 09/15/2020 1124   GFRNONAA 39 (L) 12/21/2020 1940   GFRNONAA 54 (L) 09/15/2020 1124   GFRAA 52 (L) 09/13/2017 1248   Lab Results  Component Value Date   CHOL 206 (H) 02/26/2021   HDL 100.60 02/26/2021   LDLCALC 89 02/26/2021   TRIG 83.0 02/26/2021   CHOLHDL 2 02/26/2021   Lab Results  Component Value Date   HGBA1C 5.1 02/19/2020   No results found for: VITAMINB12 Lab Results  Component Value Date   TSH 3.44 02/04/2021    12/21/20 MRI brain [I reviewed images myself and agree with interpretation. -VRP]  - Mildly motion degraded exam. - No evidence of acute intracranial abnormality. - Minimal chronic small-vessel ischemic changes within the periventricular white matter. - Mild-to-moderate generalized cerebral atrophy. Comparatively mild cerebellar atrophy. - SWI signal loss overlying portions of the left, and possibly right, frontal lobes suggesting chronic hemosiderin deposition  from remote subarachnoid hemorrhage. -  Minimal bilateral ethmoid sinus mucosal thickening. - Trace fluid within the bilateral mastoid air cells.     ASSESSMENT AND PLAN  70 y.o. year old male here with chronic alcohol abuse (4 drinks per day x many years) with neurologic and GI complications including tremors, gait difficulty, nausea, vomiting.   Dx:  1. Alcohol abuse   2. Tremors of nervous system   3. Nausea and vomiting, unspecified vomiting type      PLAN:  TREMORS / NAUSEA / STUTTERING (likely related to long term alcohol abuse) - start multivitamin daily - refer to psychiatry for alcohol dependence / abuse  Orders Placed This Encounter  Procedures   Ambulatory referral to Psychiatry   Return for pending if symptoms worsen or fail to improve.    Penni Bombard, MD 40/12/2723, 36:64 PM Certified in Neurology, Neurophysiology and Neuroimaging  [facility] [address], [place], [place] [address] [phone]

## 2021-03-09 NOTE — Patient Instructions (Addendum)
-   start multivitamin daily  - refer to psychiatry for alcohol dependence

## 2021-03-10 ENCOUNTER — Telehealth: Payer: Self-pay | Admitting: Primary Care

## 2021-03-10 DIAGNOSIS — D539 Nutritional anemia, unspecified: Secondary | ICD-10-CM

## 2021-03-10 NOTE — Telephone Encounter (Signed)
Patient is not taking supplement at this time but will start soon.  I have made lab appointment for Thursday. Request [facility] location.

## 2021-03-10 NOTE — Telephone Encounter (Signed)
Please call patient:  Dr. Jeffie Pollock updated me regarding his recent lab tests during his visit. I would like to get a B12 level on patient, lab only appt.  Is he taking supplemental B12 vitamins? Needs lab only appt. Non fasting.

## 2021-03-11 ENCOUNTER — Other Ambulatory Visit: Payer: Self-pay | Admitting: Primary Care

## 2021-03-11 ENCOUNTER — Telehealth (HOSPITAL_BASED_OUTPATIENT_CLINIC_OR_DEPARTMENT_OTHER): Payer: Self-pay | Admitting: Orthopaedic Surgery

## 2021-03-11 DIAGNOSIS — F419 Anxiety disorder, unspecified: Secondary | ICD-10-CM

## 2021-03-11 NOTE — Telephone Encounter (Signed)
LMOM for patient to reschedule 03/12/21 appt

## 2021-03-12 ENCOUNTER — Encounter (HOSPITAL_BASED_OUTPATIENT_CLINIC_OR_DEPARTMENT_OTHER): Payer: Self-pay

## 2021-03-12 ENCOUNTER — Other Ambulatory Visit: Payer: Self-pay

## 2021-03-12 ENCOUNTER — Ambulatory Visit (HOSPITAL_BASED_OUTPATIENT_CLINIC_OR_DEPARTMENT_OTHER): Payer: Medicare HMO | Admitting: Orthopaedic Surgery

## 2021-03-12 ENCOUNTER — Other Ambulatory Visit: Payer: Self-pay | Admitting: Primary Care

## 2021-03-12 ENCOUNTER — Other Ambulatory Visit (INDEPENDENT_AMBULATORY_CARE_PROVIDER_SITE_OTHER): Payer: Medicare HMO

## 2021-03-12 DIAGNOSIS — D539 Nutritional anemia, unspecified: Secondary | ICD-10-CM

## 2021-03-12 LAB — VITAMIN B12: Vitamin B-12: 228 pg/mL (ref 211–911)

## 2021-03-26 ENCOUNTER — Telehealth: Payer: Self-pay | Admitting: Diagnostic Neuroimaging

## 2021-03-26 NOTE — Telephone Encounter (Signed)
Dr. Gladstone Lighter referral note:  Referral to [facility] or [facility] for alcohol dependence/abuse  Patient can walk in to these locations for an appointment. I will notify patient.

## 2021-04-29 ENCOUNTER — Telehealth: Payer: Self-pay | Admitting: Primary Care

## 2021-04-29 NOTE — Chronic Care Management (AMB) (Signed)
°  Chronic Care Management   Note  04/29/2021 Name: Devin Howell MRN: [id number] DOB: April 12, 1950  Devin Howell is a 71 y.o. year old male who is a primary care patient of Pleas Koch, NP. I reached out to Devin Howell by phone today in response to a referral sent by [name]'s PCP, Pleas Koch, NP.   [name] was given information about Chronic Care Management services today including:  CCM service includes personalized support from designated clinical staff supervised by his physician, including individualized plan of care and coordination with other care providers 24/7 contact phone numbers for assistance for urgent and routine care needs. Service will only be billed when office clinical staff spend 20 minutes or more in a month to coordinate care. Only one practitioner may furnish and bill the service in a calendar month. The patient may stop CCM services at any time (effective at the end of the month) by phone call to the office staff.   Patient did not agree to enrollment in care management services and does not wish to consider at this time.  Follow up plan:   Tatjana Secretary/administrator

## 2021-05-09 ENCOUNTER — Other Ambulatory Visit: Payer: Self-pay | Admitting: Primary Care

## 2021-05-09 DIAGNOSIS — E039 Hypothyroidism, unspecified: Secondary | ICD-10-CM

## 2021-05-11 DIAGNOSIS — H353132 Nonexudative age-related macular degeneration, bilateral, intermediate dry stage: Secondary | ICD-10-CM | POA: Diagnosis not present

## 2021-05-14 DIAGNOSIS — N1831 Chronic kidney disease, stage 3a: Secondary | ICD-10-CM | POA: Diagnosis not present

## 2021-05-17 ENCOUNTER — Other Ambulatory Visit: Payer: Self-pay | Admitting: Gastroenterology

## 2021-05-17 DIAGNOSIS — R112 Nausea with vomiting, unspecified: Secondary | ICD-10-CM

## 2021-05-18 ENCOUNTER — Other Ambulatory Visit: Payer: Self-pay | Admitting: Primary Care

## 2021-05-18 DIAGNOSIS — I129 Hypertensive chronic kidney disease with stage 1 through stage 4 chronic kidney disease, or unspecified chronic kidney disease: Secondary | ICD-10-CM | POA: Diagnosis not present

## 2021-05-18 DIAGNOSIS — N200 Calculus of kidney: Secondary | ICD-10-CM | POA: Diagnosis not present

## 2021-05-18 DIAGNOSIS — R3911 Hesitancy of micturition: Secondary | ICD-10-CM | POA: Diagnosis not present

## 2021-05-18 DIAGNOSIS — N1831 Chronic kidney disease, stage 3a: Secondary | ICD-10-CM | POA: Diagnosis not present

## 2021-05-18 DIAGNOSIS — F419 Anxiety disorder, unspecified: Secondary | ICD-10-CM

## 2021-05-18 DIAGNOSIS — R809 Proteinuria, unspecified: Secondary | ICD-10-CM | POA: Diagnosis not present

## 2021-05-27 DIAGNOSIS — G4733 Obstructive sleep apnea (adult) (pediatric): Secondary | ICD-10-CM | POA: Diagnosis not present

## 2021-06-04 DIAGNOSIS — N1831 Chronic kidney disease, stage 3a: Secondary | ICD-10-CM | POA: Diagnosis not present

## 2021-06-11 ENCOUNTER — Other Ambulatory Visit (INDEPENDENT_AMBULATORY_CARE_PROVIDER_SITE_OTHER): Payer: Medicare HMO

## 2021-06-11 ENCOUNTER — Other Ambulatory Visit: Payer: Self-pay

## 2021-06-11 DIAGNOSIS — D539 Nutritional anemia, unspecified: Secondary | ICD-10-CM | POA: Diagnosis not present

## 2021-06-11 LAB — CBC
HCT: 43.4 % (ref 39.0–52.0)
Hemoglobin: 14.4 g/dL (ref 13.0–17.0)
MCHC: 33.2 g/dL (ref 30.0–36.0)
MCV: 104.5 fl — ABNORMAL HIGH (ref 78.0–100.0)
Platelets: 136 10*3/uL — ABNORMAL LOW (ref 150.0–400.0)
RBC: 4.15 Mil/uL — ABNORMAL LOW (ref 4.22–5.81)
RDW: 13.4 % (ref 11.5–15.5)
WBC: 4.6 10*3/uL (ref 4.0–10.5)

## 2021-06-11 LAB — VITAMIN B12: Vitamin B-12: 1177 pg/mL — ABNORMAL HIGH (ref 211–911)

## 2021-06-12 ENCOUNTER — Telehealth: Payer: Self-pay | Admitting: Primary Care

## 2021-06-12 DIAGNOSIS — G4733 Obstructive sleep apnea (adult) (pediatric): Secondary | ICD-10-CM | POA: Diagnosis not present

## 2021-06-12 NOTE — Chronic Care Management (AMB) (Signed)
?  Chronic Care Management  ? ?Note ? ?06/12/2021 ?Name: Devin Howell MRN: [id number] DOB: 1950/11/27 ? ?Devin Howell is a 71 y.o. year old male who is a primary care patient of Pleas Koch, NP. I reached out to Esau Grew by phone today in response to a referral sent by Devin Howell's PCP, Pleas Koch, NP.  ? ?Devin Howell was given information about Chronic Care Management services today including:  ?CCM service includes personalized support from designated clinical staff supervised by his physician, including individualized plan of care and coordination with other care providers ?24/7 contact phone numbers for assistance for urgent and routine care needs. ?Service will only be billed when office clinical staff spend 20 minutes or more in a month to coordinate care. ?Only one practitioner may furnish and bill the service in a calendar month. ?The patient may stop CCM services at any time (effective at the end of the month) by phone call to the office staff. ? ? ?Patient agreed to services and verbal consent obtained.  ? ?Follow up plan: ? ? ?Tatjana Dellinger ?Upstream Scheduler  ?

## 2021-06-24 ENCOUNTER — Other Ambulatory Visit: Payer: Self-pay | Admitting: Primary Care

## 2021-06-24 DIAGNOSIS — M1A9XX0 Chronic gout, unspecified, without tophus (tophi): Secondary | ICD-10-CM

## 2021-07-02 ENCOUNTER — Telehealth: Payer: Self-pay

## 2021-07-02 NOTE — Progress Notes (Signed)
? ? ?  Chronic Care Management ?Pharmacy Assistant  ? ?Name: Devin Howell  MRN: [id number] DOB: 12-26-1950 ? ?Reason for Encounter: CCM (Initial Questions) ?  ?Recent office visits:  ?03/10/21 Alma Friendly, NP Orders: Vitamin B12 ?02/26/21 Alma Friendly, NP Annual Exam. Start: Tizanidine 4 mg. Stop (not taking): Pantoprazole: 20 mg.  ?02/04/21 Alma Friendly, NP Neck Pain: Abnormal Labs "Gout levels look ok" Start: Prednisone 20 mg.  ? ?Recent consult visits:  ?03/26/21 Andrey Spearman, MD (Neurology): Referral: [facility] - Outpatient - Substance abuse ?03/09/21 Fredrik Rigger, MD (Neurology): Alcohol abuse: Stop (patient not taking): Tizanidine 4 mg. Start: Multivitamin daily. Referral to Psychiatry.  ?02/11/21 Silvano Rusk, MD (Gastroenterology): Multiple gastric ulcers: Change: Pantoprazole from 40 mg to 20 mg. Stop (completed course): Prednisone 20 mg.  ?01/19/21 Berniece Salines, DO (Cardiology): Dizziness: Ordered: EKG and Long Term Monitor. No med changes.  ?01/14/21 Ellouise Newer, PA (Gastroenterology): Nausea, vomiting and diarrhea. Stop (patient preference): amlodipine 5 mg and lorazepam 1 mg.  ? ?Hospital visits:  ?None in previous 6 months ? ?Medications: ?Outpatient Encounter Medications as of 07/02/2021  ?Medication Sig  ? allopurinol (ZYLOPRIM) 300 MG tablet TAKE 1 TABLET BY MOUTH DAILY FOR GOUT PREVENTION.  ? atorvastatin (LIPITOR) 20 MG tablet Take 1 tablet (20 mg total) by mouth daily. For cholesterol.  ? baclofen (LIORESAL) 10 MG tablet Take 1 tablet (10 mg total) by mouth 3 (three) times daily. As needed for hiccups  ? citalopram (CELEXA) 20 MG tablet TAKE 1 TABLET (20 MG TOTAL) BY MOUTH DAILY. FOR ANXIETY AND DEPRESSION.  ? levothyroxine (SYNTHROID) 50 MCG tablet TAKE 1 TAB BY MOUTH EVERY MORNING ON EMPTY STOMACH WITH WATER ONLY NO FOOD/OTHER MEDS FOR 30 MINS  ? metoprolol succinate (TOPROL-XL) 25 MG 24 hr tablet Take 25 mg by mouth daily.  ? ondansetron (ZOFRAN ODT) 4 MG disintegrating  tablet Take 1 tablet (4 mg total) by mouth every 8 (eight) hours as needed for nausea or vomiting.  ? pantoprazole (PROTONIX) 20 MG tablet Take 1 tablet (20 mg total) by mouth daily before breakfast.  ? pantoprazole (PROTONIX) 40 MG tablet TAKE 1 TABLET BY MOUTH TWICE A DAY  ? tamsulosin (FLOMAX) 0.4 MG CAPS capsule Take 0.4 mg by mouth at bedtime.  ? testosterone cypionate (DEPOTESTOSTERONE CYPIONATE) 200 MG/ML injection Inject 100 mg into the muscle every 28 (twenty-eight) days.  ? traZODone (DESYREL) 50 MG tablet TAKE 1 TABLET (50 MG TOTAL) BY MOUTH AT BEDTIME. FOR SLEEP.  ? ?No facility-administered encounter medications on file as of 07/02/2021.  ? ?Lab Results  ?Component Value Date/Time  ? HGBA1C 5.1 02/19/2020 11:37 AM  ? HGBA1C 5.3 01/20/2018 02:51 PM  ?  ?BP Readings from Last 3 Encounters:  ?03/09/21 (!) 162/88  ?02/26/21 (!) 150/80  ?02/11/21 (!) 142/80  ? ?Patient contacted to confirm telephone appointment with Charlene Brooke, Pharm D, on 07/07/2021 at 9:30. ? ?Do you have any problems getting your medications? No ? ?What is your top health concern you would like to discuss at your upcoming visit? Patient does not have any.  ? ?Have you seen any other providers since your last visit with PCP? Yes ? ?Star Rating Drugs:  ?Medication:  Last Fill: Day Supply ?Atorvastatin 20 mg 05/10/2021 90  ? ?Care Gaps: ?Annual wellness visit in last year? Yes 09/15/2020 ?Most Recent BP reading: 162/88 on 03/09/2021 ? ?Charlene Brooke, CPP notified ? ?Marijean Niemann, RMA ?Clinical Pharmacy Assistant ?[phone] ? ? ? ? ?

## 2021-07-02 NOTE — Progress Notes (Signed)
Entered in error

## 2021-07-07 ENCOUNTER — Ambulatory Visit (INDEPENDENT_AMBULATORY_CARE_PROVIDER_SITE_OTHER): Payer: Medicare HMO | Admitting: Pharmacist

## 2021-07-07 VITALS — BP 137/78

## 2021-07-07 DIAGNOSIS — F3342 Major depressive disorder, recurrent, in full remission: Secondary | ICD-10-CM

## 2021-07-07 DIAGNOSIS — E7800 Pure hypercholesterolemia, unspecified: Secondary | ICD-10-CM

## 2021-07-07 DIAGNOSIS — E039 Hypothyroidism, unspecified: Secondary | ICD-10-CM

## 2021-07-07 DIAGNOSIS — F419 Anxiety disorder, unspecified: Secondary | ICD-10-CM

## 2021-07-07 DIAGNOSIS — I10 Essential (primary) hypertension: Secondary | ICD-10-CM

## 2021-07-07 DIAGNOSIS — N1831 Chronic kidney disease, stage 3a: Secondary | ICD-10-CM

## 2021-07-07 NOTE — Patient Instructions (Signed)
Visit Information ? ?Phone number for Pharmacist: [phone] ? ?Thank you for meeting with me to discuss your medications! I look forward to working with you to achieve your health care goals. Below is a summary of what we talked about during the visit: ? ? Goals Addressed   ? ?  ?  ?  ?  ? This Visit's Progress  ?  Track and Manage My Blood Pressure-Hypertension     ?  Timeframe:  Long-Range Goal ?Priority:  Medium ?Start Date:      07/07/21                       ?Expected End Date:      07/08/22                ? ?Follow Up Date April 2024 ?  ?- check blood pressure daily ?- write blood pressure results in a log or diary  ?  ?Why is this important?   ?You won't feel high blood pressure, but it can still hurt your blood vessels.  ?High blood pressure can cause heart or kidney problems. It can also cause a stroke.  ?Making lifestyle changes like losing a little weight or eating less salt will help.  ?Checking your blood pressure at home and at different times of the day can help to control blood pressure.  ?If the doctor prescribes medicine remember to take it the way the doctor ordered.  ?Call the office if you cannot afford the medicine or if there are questions about it.   ?  ?Notes:  ?  ? ?  ? ? ?Care Plan : [place]  ?Updates made by Charlton Haws, RPH since 07/07/2021 12:00 AM  ?  ? ?Problem: Hypertension, Hyperlipidemia, Chronic Kidney Disease, Depression, Anxiety, BPH, and Gout   ?Priority: High  ?  ? ?Long-Range Goal: Disease mgmt   ?Start Date: 07/07/2021  ?Expected End Date: 07/08/2022  ?This Visit's Progress: On track  ?Priority: High  ?Note:   ? ?Current Barriers:  ?None identified ? ?Pharmacist Clinical Goal(s):  ?Patient will contact provider office for questions/concerns as evidenced notation of same in electronic health record through collaboration with PharmD and provider.  ? ?Interventions: ?1:1 collaboration with Pleas Koch, NP regarding development and update of  comprehensive plan of care as evidenced by provider attestation and co-signature ?Inter-disciplinary care team collaboration (see longitudinal plan of care) ?Comprehensive medication review performed; medication list updated in electronic medical record ? ?Hypertension / CKD stage 3a (BP goal <140/90) ?-Controlled - per home readings; pt reports nephrologist started losartan in Feb and he denies issues ?-Current home readings: 137/78 ?-Hx SVT (Zio patch 12/2020)  ?-Follows with Nephrology (Dr Royce Macadamia) - labs 04/2021: Cr 1.51, GFR 59 ?-Current treatment: ?Metoprolol succinate 50 mg daily - Appropriate, Effective, Safe, Accessible ?Losartan 25 mg daily (nephrology) - Appropriate, Effective, Safe, Accessible ?-Medications previously tried: amlodipine, valsartan ?-Educated on BP goals and benefits of medications for prevention of heart attack, stroke and kidney damage; Importance of home blood pressure monitoring; ?-Reviewed benefits of ARB for kidney disease ?-Counseled to monitor BP at home daily, document, and provide log at future appointments ?-Recommended to continue current medication ? ?Hyperlipidemia: (LDL goal < 100) ?-Controlled - LDL 89 (02/2021) ?-Current treatment: ?Atorvastatin 20 mg daily - Appropriate, Effective, Safe, Accessible ?-Medications previously tried: n/a  ?-Educated on Cholesterol goals; Benefits of statin for ASCVD risk reduction; ?-Recommended to continue current medication ? ?Depression/Anxiety (Goal: manage  symptoms) ?-Controlled - per pt report, his anxiety resolved within days of starting citalopram ?-Hx alcohol abuse. Has been referred to psych/[facility] outpatient program. ?-PHQ9: 0 (08/2020) - minimal depression ?-GAD7: 13 (11/2019) - moderate anxiety ?-Connected with PCP for mental health support ?-Current treatment: ?Citalopram 20 mg daily - Appropriate, Effective, Safe, Accessible ?Trazodone 50 mg HS - Appropriate, Effective, Safe, Accessible ?-Medications previously tried/failed:  duloxetine ?-Educated on Benefits of medication for symptom control ?-Recommended to continue current medication ? ?Hypothyroidism (Goal: maintain TSH in goal range) ?-Controlled - TSH is at goal ?-Current treatment  ?Levothyroxine 50 mcg daily - .Appropriate, Effective, Safe, Accessible ?-Medications previously tried: n/a  ?-Recommended to continue current medication ? ?GERD (Goal: manage symptoms) ?-Controlled - pt reports he stopped taking pantoprazole altogether and has not had any further issues ?-Hx multiple gastric ulcers (s/o alcohol abuse) ?-Current treatment  ?Pantoprazole 20 mg daily - not taking ?Pantoprazole 40 mg BID (04/2021) - not taking ?-Medications previously tried: n/a  ?-Removed pantoprazole from med list ? ?BPH (Goal: manage symptoms) ?-Controlled - per pt report ?-Current treatment  ?Tamsulosin 0.4 mg daily HS - Appropriate, Effective, Safe, Accessible ?-Medications previously tried: n/a  ?-Recommended to continue current medication ? ?Gout (Goal: prevent flares) ?-Controlled - last flare years ago per patient;  ?-Current treatment  ?Allopurinol 300 mg daily - Appropriate, Effective, Safe, Accessible ?-Medications previously tried: n/a  ?-Recommended to continue current medication ? ?Health Maintenance ?-Vaccine gaps: none ?-Current therapy:  ?Baclofen 10 mg PRN (hiccups) - not using ?Ondansetron ODT 4 mg PRN  ?Testosterone 100 mg (1/2 mL) injection weekly ?Vitamin B12 1000 mcg once a week ?Magnesium 250 mg once a week ?-Patient is satisfied with current therapy and denies issues ?-Recommended to continue current medication ? ?Patient Goals/Self-Care Activities ?Patient will:  ?- take medications as prescribed as evidenced by patient report and record review ?focus on medication adherence by routine ?check blood pressure daily, document, and provide at future appointments ? ?  ?  ? ?Devin Howell was given information about Chronic Care Management services today including:  ?CCM service  includes personalized support from designated clinical staff supervised by his physician, including individualized plan of care and coordination with other care providers ?24/7 contact phone numbers for assistance for urgent and routine care needs. ?Standard insurance, coinsurance, copays and deductibles apply for chronic care management only during months in which we provide at least 20 minutes of these services. Most insurances cover these services at 100%, however patients may be responsible for any copay, coinsurance and/or deductible if applicable. This service may help you avoid the need for more expensive face-to-face services. ?Only one practitioner may furnish and bill the service in a calendar month. ?The patient may stop CCM services at any time (effective at the end of the month) by phone call to the office staff. ? ?Patient agreed to services and verbal consent obtained.  ? ?Patient verbalizes understanding of instructions and care plan provided today and agrees to view in [place]. Active MyChart status confirmed with patient.   ?Telephone follow up appointment with pharmacy team member scheduled for: 1 year ? ?Charlene Brooke, PharmD, BCACP ?Clinical Pharmacist ?[facility] ?[phone]  ?

## 2021-07-07 NOTE — Progress Notes (Signed)
? ?Chronic Care Management ?Pharmacy Note ? ?07/07/2021 ?Name:  Devin Howell MRN:  [id number] DOB:  Aug 02, 1950 ? ?Summary: CCM Initial visit ?-Pt was started on losartan 25 mg daily Feb 2023 per nephrology. BP 137/78 at home. ?-GFR improved to 49 in Feb 2023 per Nephrology notes ?-Pt is no longer taking pantoprazole, he decided he no longer needed it ?-Pt is taking B12 once weekly now (B12 > 1000 in March) ? ?Recommendations/Changes made from today's visit: ?-No med changes (updated med list) ? ?Plan: ?-Nina will call patient 6 months for general update ?-Pharmacist follow up televisit scheduled for 1 year ?-[facility] AWV 09/16/21 ? ? ?Subjective: ?Devin Howell is an 71 y.o. year old male who is a primary patient of Pleas Koch, NP.  The CCM team was consulted for assistance with disease management and care coordination needs.   ? ?Engaged with patient by telephone for initial visit in response to provider referral for pharmacy case management and/or care coordination services.  ? ?Consent to Services:  ?The patient was given the following information about Chronic Care Management services today, agreed to services, and gave verbal consent: 1. CCM service includes personalized support from designated clinical staff supervised by the primary care provider, including individualized plan of care and coordination with other care providers 2. 24/7 contact phone numbers for assistance for urgent and routine care needs. 3. Service will only be billed when office clinical staff spend 20 minutes or more in a month to coordinate care. 4. Only one practitioner may furnish and bill the service in a calendar month. 5.The patient may stop CCM services at any time (effective at the end of the month) by phone call to the office staff. 6. The patient will be responsible for cost sharing (co-pay) of up to 20% of the service fee (after annual deductible is met). Patient agreed to services and consent  obtained. ? ?Patient Care Team: ?Pleas Koch, NP as PCP - General (Internal Medicine) ?Sueanne Margarita, MD as PCP - Cardiology (Cardiology) ?Irine Seal, MD as Attending Physician (Urology) ?Ledarius Leeson, Cleaster [place], [facility] as Pharmacist (Pharmacist) ? ?Recent office visits: ?06/11/21 Lab only - B12 1177.  "If you are taking B12 1000 mcg daily, I recommend you reduce to 1000 mcg 3 times weekly." ? ?03/10/21 Alma Friendly, NP Orders: Vitamin B12 - 228. Start B12 1000 mcg daily. Repeat B12 and CBC in 3 months. ? ?02/26/21 Alma Friendly, NP OV:  Annual Exam. Start: Tizanidine 4 mg. Refilled pantoprazole 20 mg taper. Referred to ortho for neck pain. ? ?02/04/21 Alma Friendly, NP OV: Neck Pain: Abnormal Labs "Gout levels look ok" Start: Prednisone 20 mg taper. Take baclofen PRN. Take Zofran PRN. ? ?Recent consult visits: ?05/18/21 Dr Royce Macadamia (Nephrology): f/u CKD. Start losartan 25 mg daily. Rec'c Vitamin D, Magnesium. ? ?03/26/21 Andrey Spearman, MD (Neurology): Referral: [facility] - Outpatient - Substance abuse ? ?03/09/21 Fredrik Rigger, MD (Neurology): Initial visit, worsening balance. Alcohol abuse: Stop (patient not taking): Tizanidine 4 mg. Start: Multivitamin daily. Referral to Psychiatry [facility]) ? ?02/11/21 Silvano Rusk, MD (Gastroenterology): Multiple gastric ulcers: reduce pantoprazole to 40 mg once daily then reduce further to 20 mg daily. ? ?01/19/21 Berniece Salines, DO (Cardiology): Dizziness: Ordered: EKG and Zio patch 14 days. "Your monitor showed that your heart rates going up into the 160s, paroxysmal supraventricular tachycardia if you are taking the metoprolol 25 mg daily if you are having symptoms I like to increase it to 25 mg in the morning  and 12.5 at nighttime" ? ?01/14/21 Ellouise Newer, PA (Gastroenterology): Nausea, vomiting and diarrhea. Stop (patient preference): amlodipine 5 mg and lorazepam 1 mg.  ? ?Hospital visits: ?None in previous 6 months ? ? ?Objective: ? ?Lab Results  ?Component  Value Date  ? CREATININE 1.84 (H) 02/04/2021  ? BUN 28 (H) 02/04/2021  ? GFR 36.35 (L) 02/04/2021  ? GFRNONAA 39 (L) 12/21/2020  ? GFRAA 52 (L) 09/13/2017  ? NA 143 02/04/2021  ? K 4.3 02/04/2021  ? CALCIUM 9.4 02/04/2021  ? CO2 32 02/04/2021  ? GLUCOSE 89 02/04/2021  ? ? ?Lab Results  ?Component Value Date/Time  ? HGBA1C 5.1 02/19/2020 11:37 AM  ? HGBA1C 5.3 01/20/2018 02:51 PM  ? GFR 36.35 (L) 02/04/2021 11:31 AM  ? GFR 39.17 (L) 12/24/2020 02:31 PM  ?  ?Last diabetic Eye exam: No results found for: HMDIABEYEEXA  ?Last diabetic Foot exam: No results found for: HMDIABFOOTEX  ? ?Lab Results  ?Component Value Date  ? CHOL 206 (H) 02/26/2021  ? HDL 100.60 02/26/2021  ? [place] 89 02/26/2021  ? TRIG 83.0 02/26/2021  ? CHOLHDL 2 02/26/2021  ? ? ? ?  Latest Ref Rng & Units 12/21/2020  ? 10:53 AM 10/17/2020  ? 11:56 AM 09/15/2020  ? 11:24 AM  ?Hepatic Function  ?Total Protein 6.5 - 8.1 g/dL 7.0   7.1   7.1    ?Albumin 3.5 - 5.0 g/dL 3.8   4.1   3.8    ?AST 15 - 41 U/L 33   41   26    ?ALT 0 - 44 U/L 33   41   25    ?Alk Phosphatase 38 - 126 U/L 86   79   86    ?Total Bilirubin 0.3 - 1.2 mg/dL 0.5   1.6   1.1    ? ? ?Lab Results  ?Component Value Date/Time  ? TSH 3.44 02/04/2021 11:31 AM  ? TSH 5.724 (H) 08/15/2020 06:42 PM  ? TSH 2.64 02/19/2020 11:37 AM  ? FREET4 1.11 08/18/2020 10:11 AM  ? ? ? ?  Latest Ref Rng & Units 06/11/2021  ? 11:31 AM 02/04/2021  ? 11:31 AM 12/21/2020  ? 10:53 AM  ?CBC  ?WBC 4.0 - 10.5 K/uL 4.6   4.1   5.9    ?Hemoglobin 13.0 - 17.0 g/dL 14.4   13.4   13.6    ?Hematocrit 39.0 - 52.0 % 43.4   40.5   39.9    ?Platelets 150.0 - 400.0 K/uL 136.0   134.0   181    ? ? ?No results found for: VD25OH ? ?Clinical ASCVD: No  ?The ASCVD Risk score (Arnett DK, et al., 2019) failed to calculate for the following reasons: ?  The patient has a prior MI or stroke diagnosis   ? ? ?  09/15/2020  ?  9:07 AM 02/19/2020  ? 11:04 AM 12/13/2019  ? 12:11 PM  ?Depression screen PHQ 2/9  ?Decreased Interest 0 0 3  ?Down,  Depressed, Hopeless 0 0 3  ?PHQ - 2 Score 0 0 6  ?Altered sleeping 0 0 2  ?Tired, decreased energy 0 0 3  ?Change in appetite 0 0 3  ?Feeling bad or failure about yourself  0 0 3  ?Trouble concentrating 0 0 3  ?Moving slowly or fidgety/restless 0 0 3  ?Suicidal thoughts 0 0 0  ?PHQ-9 Score 0 0 23  ?Difficult doing work/chores Not difficult at all Not difficult at all   ?  ? ?  Social History  ? ?Tobacco Use  ?Smoking Status Never  ? Passive exposure: Never  ?Smokeless Tobacco Never  ? ?BP Readings from Last 3 Encounters:  ?07/07/21 137/78  ?03/09/21 (!) 162/88  ?02/26/21 (!) 150/80  ? ?Pulse Readings from Last 3 Encounters:  ?03/09/21 62  ?02/26/21 72  ?02/11/21 68  ? ?Wt Readings from Last 3 Encounters:  ?03/09/21 238 lb (108 kg)  ?02/26/21 231 lb 1 oz (104.8 kg)  ?02/11/21 241 lb (109.3 kg)  ? ?BMI Readings from Last 3 Encounters:  ?03/09/21 36.19 kg/m?  ?02/26/21 37.58 kg/m?  ?02/11/21 36.64 kg/m?  ? ? ?Assessment/Interventions: Review of patient past medical history, allergies, medications, health status, including review of consultants reports, laboratory and other test data, was performed as part of comprehensive evaluation and provision of chronic care management services.  ? ?SDOH:  (Social Determinants of Health) assessments and interventions performed: No - done June 2022 per AWV ? ?SDOH Screenings  ? ?Alcohol Screen: Low Risk   ? Last Alcohol Screening Score (AUDIT): 3  ?Depression (PHQ2-9): Low Risk   ? PHQ-2 Score: 0  ?Financial Resource Strain: Low Risk   ? Difficulty of Paying Living Expenses: Not hard at all  ?Food Insecurity: No Food Insecurity  ? Worried About Charity fundraiser in the Last Year: Never true  ? Ran Out of Food in the Last Year: Never true  ?Housing: Low Risk   ? Last Housing Risk Score: 0  ?Physical Activity: Sufficiently Active  ? Days of Exercise per Week: 4 days  ? Minutes of Exercise per Session: 60 min  ?Social Connections: Not on file  ?Stress: No Stress Concern Present  ?  Feeling of Stress : Not at all  ?Tobacco Use: Low Risk   ? Smoking Tobacco Use: Never  ? Smokeless Tobacco Use: Never  ? Passive Exposure: Never  ?Transportation Needs: No Transportation Needs  ? Lack of Transportation (Me

## 2021-07-10 DIAGNOSIS — N1832 Chronic kidney disease, stage 3b: Secondary | ICD-10-CM | POA: Diagnosis not present

## 2021-07-19 ENCOUNTER — Encounter (HOSPITAL_COMMUNITY): Payer: Self-pay

## 2021-07-19 ENCOUNTER — Emergency Department (HOSPITAL_COMMUNITY): Payer: Medicare HMO

## 2021-07-19 ENCOUNTER — Other Ambulatory Visit: Payer: Self-pay

## 2021-07-19 ENCOUNTER — Emergency Department (HOSPITAL_COMMUNITY)
Admission: EM | Admit: 2021-07-19 | Discharge: 2021-07-19 | Disposition: A | Discharge status: Home / Self Care | Payer: Medicare HMO | Attending: Emergency Medicine | Admitting: Emergency Medicine

## 2021-07-19 DIAGNOSIS — R69 Illness, unspecified: Secondary | ICD-10-CM | POA: Diagnosis not present

## 2021-07-19 DIAGNOSIS — Y92009 Unspecified place in unspecified non-institutional (private) residence as the place of occurrence of the external cause: Secondary | ICD-10-CM | POA: Diagnosis not present

## 2021-07-19 DIAGNOSIS — Z043 Encounter for examination and observation following other accident: Secondary | ICD-10-CM | POA: Diagnosis not present

## 2021-07-19 DIAGNOSIS — T148XXA Other injury of unspecified body region, initial encounter: Secondary | ICD-10-CM

## 2021-07-19 DIAGNOSIS — F10920 Alcohol use, unspecified with intoxication, uncomplicated: Secondary | ICD-10-CM | POA: Diagnosis not present

## 2021-07-19 DIAGNOSIS — Z79899 Other long term (current) drug therapy: Secondary | ICD-10-CM | POA: Insufficient documentation

## 2021-07-19 DIAGNOSIS — S80212A Abrasion, left knee, initial encounter: Secondary | ICD-10-CM | POA: Insufficient documentation

## 2021-07-19 DIAGNOSIS — W07XXXA Fall from chair, initial encounter: Secondary | ICD-10-CM | POA: Insufficient documentation

## 2021-07-19 DIAGNOSIS — D696 Thrombocytopenia, unspecified: Secondary | ICD-10-CM | POA: Diagnosis not present

## 2021-07-19 DIAGNOSIS — W19XXXA Unspecified fall, initial encounter: Secondary | ICD-10-CM

## 2021-07-19 DIAGNOSIS — Y908 Blood alcohol level of 240 mg/100 ml or more: Secondary | ICD-10-CM | POA: Insufficient documentation

## 2021-07-19 DIAGNOSIS — S59902A Unspecified injury of left elbow, initial encounter: Secondary | ICD-10-CM | POA: Diagnosis not present

## 2021-07-19 DIAGNOSIS — R4781 Slurred speech: Secondary | ICD-10-CM | POA: Diagnosis not present

## 2021-07-19 DIAGNOSIS — S0990XA Unspecified injury of head, initial encounter: Secondary | ICD-10-CM | POA: Insufficient documentation

## 2021-07-19 DIAGNOSIS — S51012A Laceration without foreign body of left elbow, initial encounter: Secondary | ICD-10-CM | POA: Diagnosis not present

## 2021-07-19 DIAGNOSIS — I10 Essential (primary) hypertension: Secondary | ICD-10-CM | POA: Diagnosis not present

## 2021-07-19 DIAGNOSIS — Z743 Need for continuous supervision: Secondary | ICD-10-CM | POA: Diagnosis not present

## 2021-07-19 DIAGNOSIS — I959 Hypotension, unspecified: Secondary | ICD-10-CM | POA: Diagnosis not present

## 2021-07-19 DIAGNOSIS — R001 Bradycardia, unspecified: Secondary | ICD-10-CM | POA: Diagnosis not present

## 2021-07-19 DIAGNOSIS — M47812 Spondylosis without myelopathy or radiculopathy, cervical region: Secondary | ICD-10-CM | POA: Diagnosis not present

## 2021-07-19 LAB — CBC WITH DIFFERENTIAL/PLATELET
Abs Immature Granulocytes: 0.02 10*3/uL (ref 0.00–0.07)
Basophils Absolute: 0.1 10*3/uL (ref 0.0–0.1)
Basophils Relative: 2 %
Eosinophils Absolute: 0.1 10*3/uL (ref 0.0–0.5)
Eosinophils Relative: 4 %
HCT: 40.7 % (ref 39.0–52.0)
Hemoglobin: 12.8 g/dL — ABNORMAL LOW (ref 13.0–17.0)
Immature Granulocytes: 1 %
Lymphocytes Relative: 44 %
Lymphs Abs: 1.8 10*3/uL (ref 0.7–4.0)
MCH: 33.8 pg (ref 26.0–34.0)
MCHC: 31.4 g/dL (ref 30.0–36.0)
MCV: 107.4 fL — ABNORMAL HIGH (ref 80.0–100.0)
Monocytes Absolute: 0.3 10*3/uL (ref 0.1–1.0)
Monocytes Relative: 8 %
Neutro Abs: 1.6 10*3/uL — ABNORMAL LOW (ref 1.7–7.7)
Neutrophils Relative %: 41 %
Platelets: 111 10*3/uL — ABNORMAL LOW (ref 150–400)
RBC: 3.79 MIL/uL — ABNORMAL LOW (ref 4.22–5.81)
RDW: 14.3 % (ref 11.5–15.5)
WBC: 3.9 10*3/uL — ABNORMAL LOW (ref 4.0–10.5)
nRBC: 0 % (ref 0.0–0.2)

## 2021-07-19 LAB — BASIC METABOLIC PANEL
Anion gap: 15 (ref 5–15)
BUN: 27 mg/dL — ABNORMAL HIGH (ref 8–23)
CO2: 22 mmol/L (ref 22–32)
Calcium: 8.4 mg/dL — ABNORMAL LOW (ref 8.9–10.3)
Chloride: 99 mmol/L (ref 98–111)
Creatinine, Ser: 1.63 mg/dL — ABNORMAL HIGH (ref 0.61–1.24)
GFR, Estimated: 44 mL/min — ABNORMAL LOW (ref >60)
Glucose, Bld: 85 mg/dL (ref 70–99)
Potassium: 4.2 mmol/L (ref 3.5–5.1)
Sodium: 136 mmol/L (ref 135–145)

## 2021-07-19 LAB — CBG MONITORING, ED: CBG: 89

## 2021-07-19 LAB — ETHANOL: Alcohol, Ethyl (B): 425 mg/dL (ref <10)

## 2021-07-19 NOTE — ED Notes (Signed)
Ambulated in hall without assistance  Tolerated well. ?

## 2021-07-19 NOTE — ED Triage Notes (Signed)
Pt here from home via [facility].  Pt was on patio in a chair when his wife looked at the back patio and she found him on the ground.  Skin tear to the L forearm.  L knee has an abrasion.  ETOH on board, but family states he only drank 1 glass of wine, though states it smelled like there was more etoh.  Nystagmus ordered. Stroke screen negative, though gait is abnormal - though no one sided weakness.   ?

## 2021-07-19 NOTE — Discharge Instructions (Addendum)
Please follow up with your PCP for further evaluation regarding ED visit today ? ?You should apply bacitracin ointment (neosporin) to your skin tear and abrasion to help with wound healing/prevent infection ? ?You can take Ibuprofen/Tylenol as needed for pain ? ?Return to the ED for any new/worsening symptoms  ?

## 2021-07-19 NOTE — ED Provider Notes (Signed)
?[place] ?Provider Note ? ? ?CSN: [id number] ?Arrival date & time: 07/19/21  1901 ? ?  ? ?History ? ?Chief Complaint  ?Patient presents with  ? Fall  ? ? ?Devin Howell is a 71 y.o. male with PMHx HTN, HLD, GERD, OSA on CPAP, and hx of alcohol use disorder who presents to the ED today via EMS for fall.  Per EMS patient's wife had found him on the patio on the ground.  He was noted to have a skin tear to his left forearm and an abrasion to his left knee.  EMS had reported that patient smelled heavily of EtOH however family reports that he only drank 1 glass of wine around noon today.  EMS had noted nystagmus however otherwise no focal neurodeficits on exam besides evidence of alcohol intoxication including slight slurring of words as well as gait instability.   ? ?Pt has no complaints at this time.  ? ?Per wife she reports that they were sitting outside on the patio.  She had gotten up to go into the house and thought patient was following her when she heard a crash.  She found him on the ground with the chair falling over.  She states the patient attempted get up however fell again and landed on his forehead.  She denies loss of consciousness.  Currently does not have his hearing aids in and is hard of hearing.  She states that he is not acting right and does not appear to be comprehending what we are saying however difficult to assess whether it is related to him having difficulty hearing. Pt with skin tear to L forearm and abrasion to L knee. Tetanus UTD.  ? ?The history is provided by the patient, the EMS personnel, the spouse and medical records.  ? ?  ? ?Home Medications ?Prior to Admission medications   ?Medication Sig Start Date End Date Taking? Authorizing Provider  ?allopurinol (ZYLOPRIM) 300 MG tablet TAKE 1 TABLET BY MOUTH DAILY FOR GOUT PREVENTION. 06/24/21   Pleas Koch, NP  ?atorvastatin (LIPITOR) 20 MG tablet Take 1 tablet (20 mg total) by mouth  daily. For cholesterol. 06/10/20   Pleas Koch, NP  ?citalopram (CELEXA) 20 MG tablet TAKE 1 TABLET (20 MG TOTAL) BY MOUTH DAILY. FOR ANXIETY AND DEPRESSION. 03/11/21   Pleas Koch, NP  ?levothyroxine (SYNTHROID) 50 MCG tablet TAKE 1 TAB BY MOUTH EVERY MORNING ON EMPTY STOMACH WITH WATER ONLY NO FOOD/OTHER MEDS FOR 30 MINS 05/10/21   Pleas Koch, NP  ?losartan (COZAAR) 25 MG tablet Take 25 mg by mouth daily.    Claudia Desanctis, MD  ?Magnesium 250 MG TABS Take 1 tablet by mouth once a week.    [provider]  ?metoprolol succinate (TOPROL-XL) 50 MG 24 hr tablet Take 50 mg by mouth daily.    Claudia Desanctis, MD  ?ondansetron (ZOFRAN ODT) 4 MG disintegrating tablet Take 1 tablet (4 mg total) by mouth every 8 (eight) hours as needed for nausea or vomiting. 12/24/20   Pleas Koch, NP  ?tamsulosin (FLOMAX) 0.4 MG CAPS capsule Take 0.4 mg by mouth at bedtime. 12/24/19   Claudia Desanctis, MD  ?testosterone cypionate (DEPOTESTOSTERONE CYPIONATE) 200 MG/ML injection Inject 100 mg into the muscle once a week. 01/10/19   Irine Seal, MD  ?traZODone (DESYREL) 50 MG tablet TAKE 1 TABLET (50 MG TOTAL) BY MOUTH AT BEDTIME. FOR SLEEP. 05/18/21   Pleas Koch, NP  ?  vitamin B-12 (CYANOCOBALAMIN) 1000 MCG tablet Take 1,000 mcg by mouth once a week.    [provider]  ?   ? ?Allergies    ?Tizanidine   ? ?Review of Systems   ?Review of Systems  ?Constitutional:  Negative for fever.  ?Musculoskeletal:  Negative for arthralgias.  ?Skin:  Positive for wound (skin tear/abrasion).  ?Neurological:  Negative for syncope.  ?All other systems reviewed and are negative. ? ?Physical Exam ?Updated Vital Signs ?BP 100/85   Pulse 61   Resp 16   SpO2 97%  ?Physical Exam ?Vitals and nursing note reviewed.  ?Constitutional:   ?   Appearance: He is not ill-appearing or diaphoretic.  ?   Comments: Smells of EtOH  ?HENT:  ?   Head: Normocephalic and atraumatic.  ?   Comments: No signs of head injury ?Eyes:  ?    Extraocular Movements: Extraocular movements intact.  ?   Conjunctiva/sclera: Conjunctivae normal.  ?   Pupils: Pupils are equal, round, and reactive to light.  ?   Comments: Horizontal nystagmus present  ?Cardiovascular:  ?   Rate and Rhythm: Normal rate and regular rhythm.  ?Pulmonary:  ?   Effort: Pulmonary effort is normal.  ?   Breath sounds: Normal breath sounds. No wheezing, rhonchi or rales.  ?Abdominal:  ?   Palpations: Abdomen is soft.  ?   Tenderness: There is no abdominal tenderness.  ?Musculoskeletal:  ?   Cervical back: Neck supple. No tenderness.  ?   Comments: Skin tear to L forearm just distal to elbow joint. Mild TTP. ROM intact to shoulder, elbow, and wrist. Strength and sensation intact. 2+ radial pulse.  ? ?Abrasion noted to L knee. ROM intact. 2+ Dp pulse.   ?Skin: ?   General: Skin is warm and dry.  ?Neurological:  ?   Mental Status: He is alert.  ?   Comments: Alert and oriented to self, place, time and event.  ? ?Speech is somewhat slurred. Appears intoxicated.   ? ?Strength 5/5 in upper/lower extremities   ?Sensation intact in upper/lower extremities  ? ?No pronator drift.  ?Normal finger-to-nose and feet tapping.  ?CN I not tested  ?CN II grossly intact visual fields bilaterally. Did not visualize posterior eye.  ?CN III, IV, VI PERRLA and EOMs intact bilaterally  ?CN V Intact sensation to sharp and light touch to the face  ?CN VII facial movements symmetric  ?CN VIII not tested  ?CN IX, X no uvula deviation, symmetric rise of soft palate  ?CN XI 5/5 SCM and trapezius strength bilaterally  ?CN XII Midline tongue protrusion, symmetric L/R movements   ? ? ?ED Results / Procedures / Treatments   ?Labs ?(all labs ordered are listed, but only abnormal results are displayed) ?Labs Reviewed  ?CBC WITH DIFFERENTIAL/PLATELET - Abnormal; Notable for the following components:  ?    Result Value  ? WBC 3.9 (*)   ? RBC 3.79 (*)   ? Hemoglobin 12.8 (*)   ? MCV 107.4 (*)   ? Platelets 111 (*)   ? Neutro  Abs 1.6 (*)   ? All other components within normal limits  ?BASIC METABOLIC PANEL - Abnormal; Notable for the following components:  ? BUN 27 (*)   ? Creatinine, Ser 1.63 (*)   ? Calcium 8.4 (*)   ? GFR, Estimated 44 (*)   ? All other components within normal limits  ?ETHANOL - Abnormal; Notable for the following components:  ? Alcohol, Ethyl (B)  425 (*)   ? All other components within normal limits  ?RAPID URINE DRUG SCREEN, HOSP PERFORMED  ?CBG MONITORING, ED  ? ? ?EKG ?EKG Interpretation ? ?Date/Time:  Sunday July 19 2021 19:08:36 EDT ?Ventricular Rate:  62 ?PR Interval:  160 ?QRS Duration: 100 ?QT Interval:  437 ?QTC Calculation: 444 ?R Axis:   -2 ?Text Interpretation: Sinus rhythm Low voltage, precordial leads No significant change since last tracing Confirmed by Lajean Saver [phone]) on 07/19/2021 7:19:35 PM ? ?Radiology ?DG Forearm Left ? ?Result Date: 07/19/2021 ?CLINICAL DATA:  Fall EXAM: LEFT FOREARM - 2 VIEW COMPARISON:  None. FINDINGS: There is no evidence of fracture or other focal bone lesions. Visualized wrist and elbow grossly unremarkable. Soft tissues are unremarkable. IMPRESSION: Negative. Electronically Signed   By: Iven Finn M.D.   On: 07/19/2021 20:05  ? ?CT HEAD WO CONTRAST (5MM) ? ?Result Date: 07/19/2021 ?CLINICAL DATA:  fall, head injury; fell. rule out neck fx EXAM: CT HEAD WITHOUT CONTRAST CT CERVICAL SPINE WITHOUT CONTRAST TECHNIQUE: Multidetector CT imaging of the head and cervical spine was performed following the standard protocol without intravenous contrast. Multiplanar CT image reconstructions of the cervical spine were also generated. RADIATION DOSE REDUCTION: This exam was performed according to the departmental dose-optimization program which includes automated exposure control, adjustment of the mA and/or kV according to patient size and/or use of iterative reconstruction technique. COMPARISON:  None. FINDINGS: CT HEAD FINDINGS BRAIN: BRAIN Mild patchy areas of decreased  attenuation are noted throughout the deep and periventricular white matter of the cerebral hemispheres bilaterally, compatible with chronic microvascular ischemic disease. No evidence of large-territorial acu

## 2021-07-19 NOTE — ED Notes (Signed)
Blood alcohol 425, read back and verified with Santiago Glad. ?

## 2021-07-20 LAB — CBG MONITORING, ED: Glucose-Capillary: 89 mg/dL (ref 70–99)

## 2021-07-26 DIAGNOSIS — N1831 Chronic kidney disease, stage 3a: Secondary | ICD-10-CM

## 2021-07-26 DIAGNOSIS — R69 Illness, unspecified: Secondary | ICD-10-CM | POA: Diagnosis not present

## 2021-07-26 DIAGNOSIS — E785 Hyperlipidemia, unspecified: Secondary | ICD-10-CM

## 2021-07-26 DIAGNOSIS — I129 Hypertensive chronic kidney disease with stage 1 through stage 4 chronic kidney disease, or unspecified chronic kidney disease: Secondary | ICD-10-CM

## 2021-07-26 DIAGNOSIS — F32A Depression, unspecified: Secondary | ICD-10-CM | POA: Diagnosis not present

## 2021-07-26 DIAGNOSIS — E039 Hypothyroidism, unspecified: Secondary | ICD-10-CM

## 2021-08-06 ENCOUNTER — Other Ambulatory Visit: Payer: Self-pay | Admitting: Primary Care

## 2021-08-06 DIAGNOSIS — E782 Mixed hyperlipidemia: Secondary | ICD-10-CM

## 2021-09-16 ENCOUNTER — Ambulatory Visit (INDEPENDENT_AMBULATORY_CARE_PROVIDER_SITE_OTHER): Payer: Medicare HMO

## 2021-09-16 VITALS — Wt 238.0 lb

## 2021-09-16 DIAGNOSIS — Z0000 Encounter for general adult medical examination without abnormal findings: Secondary | ICD-10-CM

## 2021-09-16 NOTE — Progress Notes (Signed)
Virtual Visit via Telephone Note  I connected with  Devin Howell on 09/16/21 at  8:45 AM EDT by telephone and verified that I am speaking with the correct person using two identifiers.  Location: Patient: home Provider: [place] Persons participating in the virtual visit: patient/Nurse Health Advisor   I discussed the limitations, risks, security and privacy concerns of performing an evaluation and management service by telephone and the availability of in person appointments. The patient expressed understanding and agreed to proceed.  Interactive audio and video telecommunications were attempted between this nurse and patient, however failed, due to patient having technical difficulties OR patient did not have access to video capability.  We continued and completed visit with audio only.  Some vital signs may be absent or patient reported.   Dionisio Pasha, LPN  Subjective:   Devin Howell is a 71 y.o. male who presents for Medicare Annual/Subsequent preventive examination.  Review of Systems     Cardiac Risk Factors include: advanced age (>19mn, >>84women);hypertension;male gender     Objective:    There were no vitals filed for this visit. There is no height or weight on file to calculate BMI.     09/16/2021    8:42 AM 09/15/2020    9:05 AM 08/15/2020    8:05 PM 08/15/2020    1:51 PM 02/08/2019    2:22 PM 12/16/2016   12:44 PM 05/09/2016   11:24 AM  Advanced Directives  Does Patient Have a Medical Advance Directive? No Yes  No Yes Yes Yes  Type of ASocial research officer, governmentLiving will   [place]North [place]Living will [place]Living will [place] will  Does patient want to make changes to medical advance directive?       No - Patient declined  Copy of [place] Chart?  No - copy requested   No - copy requested No - copy requested No - copy requested  Would  patient like information on creating a medical advance directive? No - Patient declined  No - Patient declined        Current Medications (verified) Outpatient Encounter Medications as of 09/16/2021  Medication Sig   allopurinol (ZYLOPRIM) 300 MG tablet TAKE 1 TABLET BY MOUTH DAILY FOR GOUT PREVENTION.   amLODipine (NORVASC) 5 MG tablet Take 5 mg by mouth daily.   atorvastatin (LIPITOR) 20 MG tablet TAKE 1 TABLET BY MOUTH DAILY. FOR CHOLESTEROL   citalopram (CELEXA) 20 MG tablet TAKE 1 TABLET (20 MG TOTAL) BY MOUTH DAILY. FOR ANXIETY AND DEPRESSION.   hydrALAZINE (APRESOLINE) 10 MG tablet Take 10 mg by mouth 2 (two) times daily.   hydrALAZINE (APRESOLINE) 25 MG tablet Take 25 mg by mouth 2 (two) times daily.   levothyroxine (SYNTHROID) 50 MCG tablet TAKE 1 TAB BY MOUTH EVERY MORNING ON EMPTY STOMACH WITH WATER ONLY NO FOOD/OTHER MEDS FOR 30 MINS   losartan (COZAAR) 25 MG tablet Take 25 mg by mouth daily.   Magnesium 250 MG TABS Take 1 tablet by mouth once a week.   metoprolol succinate (TOPROL-XL) 50 MG 24 hr tablet Take 50 mg by mouth daily.   ondansetron (ZOFRAN ODT) 4 MG disintegrating tablet Take 1 tablet (4 mg total) by mouth every 8 (eight) hours as needed for nausea or vomiting.   pantoprazole (PROTONIX) 40 MG tablet Take 40 mg by mouth 2 (two) times daily.   tamsulosin (FLOMAX) 0.4 MG CAPS  capsule Take 0.4 mg by mouth at bedtime.   testosterone cypionate (DEPOTESTOSTERONE CYPIONATE) 200 MG/ML injection Inject 100 mg into the muscle once a week.   traZODone (DESYREL) 50 MG tablet TAKE 1 TABLET (50 MG TOTAL) BY MOUTH AT BEDTIME. FOR SLEEP.   vitamin B-12 (CYANOCOBALAMIN) 1000 MCG tablet Take 1,000 mcg by mouth once a week.   No facility-administered encounter medications on file as of 09/16/2021.    Allergies (verified) Patient has no active allergies.   History: Past Medical History:  Diagnosis Date   AKI (acute kidney injury) ([place]) 08/15/2020   Cataract    Chronic gout    CKD  (chronic kidney disease)    pt. denies   Erectile dysfunction    GERD (gastroesophageal reflux disease)    Herpes simplex    Hx of adenomatous polyp of colon 04/12/2017   Hyperlipidemia    pt. denies   Hypertension    pt. denies   OSA on CPAP    Sleep apnea    cpap   Subarachnoid hematoma (HCC)    Subdural hematoma (HCC)    Testosterone deficiency    Past Surgical History:  Procedure Laterality Date   COLONOSCOPY     ESOPHAGOGASTRODUODENOSCOPY     TONSILLECTOMY AND ADENOIDECTOMY  1960   Family History  Problem Relation Age of Onset   Rectal cancer Mother    Heart Problems Father    Colon polyps Neg Hx    Esophageal cancer Neg Hx    Colon cancer Neg Hx    Stomach cancer Neg Hx    Social History   Socioeconomic History   Marital status: Married    Spouse name: Not on file   Number of children: Not on file   Years of education: Not on file   Highest education level: Bachelor's degree (e.g., BA, AB, BS)  Occupational History   Not on file  Tobacco Use   Smoking status: Never    Passive exposure: Never   Smokeless tobacco: Never  Vaping Use   Vaping Use: Never used  Substance and Sexual Activity   Alcohol use: Yes    Alcohol/week: 2.0 standard drinks of alcohol    Types: 2 Glasses of wine per week   Drug use: No   Sexual activity: Not Currently  Other Topics Concern   Not on file  Social History Narrative   Lives with wife at home    Right handed    Some caffeine intake    Social Determinants of Health   Financial Resource Strain: Low Risk  (09/16/2021)   Overall Financial Resource Strain (CARDIA)    Difficulty of Paying Living Expenses: Not hard at all  Food Insecurity: No Food Insecurity (09/16/2021)   Hunger Vital Sign    Worried About Running Out of Food in the Last Year: Never true    Ran Out of Food in the Last Year: Never true  Transportation Needs: No Transportation Needs (09/16/2021)   PRAPARE - Hydrologist  (Medical): No    Lack of Transportation (Non-Medical): No  Physical Activity: Sufficiently Active (09/16/2021)   Exercise Vital Sign    Days of Exercise per Week: 5 days    Minutes of Exercise per Session: 40 min  Stress: No Stress Concern Present (09/16/2021)   [place]    Feeling of Stress : Not at all  Social Connections: Socially Isolated (09/16/2021)   Social Connection and Isolation Panel [  NHANES]    Frequency of Communication with Friends and Family: Once a week    Frequency of Social Gatherings with Friends and Family: Never    Attends Religious Services: Never    Printmaker: No    Attends Music therapist: Never    Marital Status: Married    Tobacco Counseling Counseling given: Not Answered   Clinical Intake:  Pre-visit preparation completed: Yes        Nutritional Risks: None Diabetes: No  How often do you need to have someone help you when you read instructions, pamphlets, or other written materials from your doctor or pharmacy?: 1 - Never  Diabetic?no  Interpreter Needed?: No  Information entered by :: Kirke Shaggy, LPN   Activities of Daily Living    09/16/2021    8:43 AM  In your present state of health, do you have any difficulty performing the following activities:  Hearing? 1  Vision? 0  Difficulty concentrating or making decisions? 0  Walking or climbing stairs? 0  Dressing or bathing? 0  Doing errands, shopping? 0  Preparing Food and eating ? N  Using the Toilet? N  In the past six months, have you accidently leaked urine? N  Do you have problems with loss of bowel control? N  Managing your Medications? N  Managing your Finances? N  Housekeeping or managing your Housekeeping? N    Patient Care Team: Pleas Koch, NP as PCP - General (Internal Medicine) Sueanne Margarita, MD as PCP - Cardiology (Cardiology) Irine Seal, MD  as Attending Physician (Urology) Charlton Haws, [facility] as Pharmacist (Pharmacist)  Indicate any recent Medical Services you may have received from other than [facility] providers in the past year (date may be approximate).     Assessment:   This is a routine wellness examination for D[place].  Hearing/Vision screen Hearing Screening - Comments:: Wears aids Vision Screening - Comments:: No glasses  Dietary issues and exercise activities discussed: Current Exercise Habits: Home exercise routine, Type of exercise: walking, Time (Minutes): 45, Frequency (Times/Week): 5, Weekly Exercise (Minutes/Week): 225, Intensity: Mild   Goals Addressed             This Visit's Progress    DIET - EAT MORE FRUITS AND VEGETABLES         Depression Screen    09/16/2021    8:40 AM 09/15/2020    9:07 AM 02/19/2020   11:04 AM 12/13/2019   12:11 PM 12/13/2019   11:48 AM 02/08/2019    2:23 PM 12/16/2016   12:42 PM  PHQ 2/9 Scores  PHQ - 2 Score 0 0 0 6 6 0 0  PHQ- 9 Score 0 0 0 23 20 0 0    Fall Risk    09/16/2021    8:43 AM 09/15/2020    9:05 AM 02/19/2020   11:04 AM 12/13/2019   11:48 AM 02/08/2019    2:23 PM  Fall Risk   Falls in the past year? 0 1 0 0 0  Number falls in past yr: 0 1 0 0 0  Injury with Fall? 0 0 0 0 0  Risk for fall due to : No Fall Risks Medication side effect   Medication side effect  Follow up Falls evaluation completed Falls evaluation completed;Falls prevention discussed   Falls evaluation completed;Falls prevention discussed    FALL RISK PREVENTION PERTAINING TO THE HOME:  Any stairs in or around the home? Yes  If so,  are there any without handrails? No  Home free of loose throw rugs in walkways, pet beds, electrical cords, etc? Yes  Adequate lighting in your home to reduce risk of falls? Yes   ASSISTIVE DEVICES UTILIZED TO PREVENT FALLS:  Life alert? No  Use of a cane, walker or w/c? No  Grab bars in the bathroom? No  Shower chair or bench in shower? Yes   Elevated toilet seat or a handicapped toilet? Yes   Cognitive Function:    09/15/2020    9:11 AM 02/08/2019    2:24 PM 12/16/2016   12:44 PM  MMSE - Mini Mental State Exam  Orientation to time '5 5 5  '$ Orientation to Place '5 5 5  '$ Registration '3 3 3  '$ Attention/ Calculation 5 5 0  Recall '3 3 3  '$ Language- name 2 objects   0  Language- repeat '1 1 1  '$ Language- follow 3 step command   3  Language- read & follow direction   0  Write a sentence   0  Copy design   0  Total score   20        09/16/2021    8:44 AM  6CIT Screen  What Year? 0 points  What month? 0 points  What time? 0 points  Count back from 20 0 points  Months in reverse 0 points  Repeat phrase 0 points  Total Score 0 points    Immunizations Immunization History  Administered Date(s) Administered   Fluad Quad(high Dose 65+) 12/17/2020   Influenza, High Dose Seasonal PF 12/08/2016   Influenza,inj,Quad PF,6+ Mos 11/02/2018   Influenza-Unspecified 11/27/2017, 12/25/2019   PFIZER(Purple Top)SARS-COV-2 Vaccination 04/23/2019, 05/14/2019, 12/25/2019, 05/26/2020   PNEUMOCOCCAL CONJUGATE-20 12/10/2020   Pfizer Covid-19 Vaccine Bivalent Booster 70yr & up 12/11/2020   Pneumococcal Conjugate-13 02/12/2015   Pneumococcal Polysaccharide-23 12/16/2016   Td 12/16/2016   Zoster Recombinat (Shingrix) 10/14/2016, 03/13/2017    TDAP status: Up to date  Flu Vaccine status: Up to date  Pneumococcal vaccine status: Up to date  Covid-19 vaccine status: Completed vaccines  Qualifies for Shingles Vaccine? Yes   Zostavax completed Yes   Shingrix Completed?: Yes  Screening Tests Health Maintenance  Topic Date Due   INFLUENZA VACCINE  10/27/2021   COLONOSCOPY (Pts 45-454yrInsurance coverage will need to be confirmed)  04/08/2022   TETANUS/TDAP  12/17/2026   Pneumonia Vaccine 6545Years old  Completed   Hepatitis C Screening  Completed   Zoster Vaccines- Shingrix  Completed   HPV VACCINES  Aged Out   COVID-19 Vaccine   Discontinued    Health Maintenance  There are no preventive care reminders to display for this patient.  Colorectal cancer screening: Type of screening: Colonoscopy. Completed 04/08/17. Repeat every 5 years  Lung Cancer Screening: (Low Dose CT Chest recommended if Age 72-80ears, 30 pack-year currently smoking OR have quit w/in 15years.) does not qualify.    Additional Screening:  Hepatitis C Screening: does qualify; Completed 08/15/20  Vision Screening: Recommended annual ophthalmology exams for early detection of glaucoma and other disorders of the eye. Is the patient up to date with their annual eye exam?  No  Who is the provider or what is the name of the office in which the patient attends annual eye exams? No one If pt is not established with a provider, would they like to be referred to a provider to establish care? No .   Dental Screening: Recommended annual dental exams for proper oral hygiene  Community Resource Referral / Chronic Care Management: CRR required this visit?  No   CCM required this visit?  No      Plan:     I have personally reviewed and noted the following in the patient's chart:   Medical and social history Use of alcohol, tobacco or illicit drugs  Current medications and supplements including opioid prescriptions. Patient is not currently taking opioid prescriptions. Functional ability and status Nutritional status Physical activity Advanced directives List of other physicians Hospitalizations, surgeries, and ER visits in previous 12 months Vitals Screenings to include cognitive, depression, and falls Referrals and appointments  In addition, I have reviewed and discussed with patient certain preventive protocols, quality metrics, and best practice recommendations. A written personalized care plan for preventive services as well as general preventive health recommendations were provided to patient.     Dionisio Keivon, LPN   [id number]    Nurse Notes: none

## 2021-09-16 NOTE — Patient Instructions (Signed)
Devin Howell , Thank you for taking time to come for your Medicare Wellness Visit. I appreciate your ongoing commitment to your health goals. Please review the following plan we discussed and let me know if I can assist you in the future.   Screening recommendations/referrals: Colonoscopy: 04/08/17 Recommended yearly ophthalmology/optometry visit for glaucoma screening and checkup Recommended yearly dental visit for hygiene and checkup  Vaccinations: Influenza vaccine: 12/17/20 Pneumococcal vaccine: 12/10/20 Tdap vaccine: 12/16/16 Shingles vaccine: shingrix 10/14/16, 03/13/17   Covid-19: 04/23/19, 05/14/19, 12/25/19, 05/26/20, 12/11/20  Advanced directives: no  Conditions/risks identified: none  Next appointment: Follow up in one year for your annual wellness visit. 09/20/22 @ 8:45 am by video  Preventive Care 65 Years and Older, Male Preventive care refers to lifestyle choices and visits with your health care provider that can promote health and wellness. What does preventive care include? A yearly physical exam. This is also called an annual well check. Dental exams once or twice a year. Routine eye exams. Ask your health care provider how often you should have your eyes checked. Personal lifestyle choices, including: Daily care of your teeth and gums. Regular physical activity. Eating a healthy diet. Avoiding tobacco and drug use. Limiting alcohol use. Practicing safe sex. Taking low doses of aspirin every day. Taking vitamin and mineral supplements as recommended by your health care provider. What happens during an annual well check? The services and screenings done by your health care provider during your annual well check will depend on your age, overall health, lifestyle risk factors, and family history of disease. Counseling  Your health care provider may ask you questions about your: Alcohol use. Tobacco use. Drug use. Emotional well-being. Home and relationship  well-being. Sexual activity. Eating habits. History of falls. Memory and ability to understand (cognition). Work and work Statistician. Screening  You may have the following tests or measurements: Height, weight, and BMI. Blood pressure. Lipid and cholesterol levels. These may be checked every 5 years, or more frequently if you are over 84 years old. Skin check. Lung cancer screening. You may have this screening every year starting at age 33 if you have a 30-pack-year history of smoking and currently smoke or have quit within the past 15 years. Fecal occult blood test (FOBT) of the stool. You may have this test every year starting at age 38. Flexible sigmoidoscopy or colonoscopy. You may have a sigmoidoscopy every 5 years or a colonoscopy every 10 years starting at age 86. Prostate cancer screening. Recommendations will vary depending on your family history and other risks. Hepatitis C blood test. Hepatitis B blood test. Sexually transmitted disease (STD) testing. Diabetes screening. This is done by checking your blood sugar (glucose) after you have not eaten for a while (fasting). You may have this done every 1-3 years. Abdominal aortic aneurysm (AAA) screening. You may need this if you are a current or former smoker. Osteoporosis. You may be screened starting at age 23 if you are at high risk. Talk with your health care provider about your test results, treatment options, and if necessary, the need for more tests. Vaccines  Your health care provider may recommend certain vaccines, such as: Influenza vaccine. This is recommended every year. Tetanus, diphtheria, and acellular pertussis (Tdap, Td) vaccine. You may need a Td booster every 10 years. Zoster vaccine. You may need this after age 58. Pneumococcal 13-valent conjugate (PCV13) vaccine. One dose is recommended after age 39. Pneumococcal polysaccharide (PPSV23) vaccine. One dose is recommended after age 92. Talk  to your health care  provider about which screenings and vaccines you need and how often you need them. This information is not intended to replace advice given to you by your health care provider. Make sure you discuss any questions you have with your health care provider. Document Released: 04/11/2015 Document Revised: 12/03/2015 Document Reviewed: 01/14/2015 Elsevier Interactive Patient Education  2017 [place] Prevention in the Home Falls can cause injuries. They can happen to people of all ages. There are many things you can do to make your home safe and to help prevent falls. What can I do on the outside of my home? Regularly fix the edges of walkways and driveways and fix any cracks. Remove anything that might make you trip as you walk through a door, such as a raised step or threshold. Trim any bushes or trees on the path to your home. Use bright outdoor lighting. Clear any walking paths of anything that might make someone trip, such as rocks or tools. Regularly check to see if handrails are loose or broken. Make sure that both sides of any steps have handrails. Any raised decks and porches should have guardrails on the edges. Have any leaves, snow, or ice cleared regularly. Use sand or salt on walking paths during winter. Clean up any spills in your garage right away. This includes oil or grease spills. What can I do in the bathroom? Use night lights. Install grab bars by the toilet and in the tub and shower. Do not use towel bars as grab bars. Use non-skid mats or decals in the tub or shower. If you need to sit down in the shower, use a plastic, non-slip stool. Keep the floor dry. Clean up any water that spills on the floor as soon as it happens. Remove soap buildup in the tub or shower regularly. Attach bath mats securely with double-sided non-slip rug tape. Do not have throw rugs and other things on the floor that can make you trip. What can I do in the bedroom? Use night lights. Make  sure that you have a light by your bed that is easy to reach. Do not use any sheets or blankets that are too big for your bed. They should not hang down onto the floor. Have a firm chair that has side arms. You can use this for support while you get dressed. Do not have throw rugs and other things on the floor that can make you trip. What can I do in the kitchen? Clean up any spills right away. Avoid walking on wet floors. Keep items that you use a lot in easy-to-reach places. If you need to reach something above you, use a strong step stool that has a grab bar. Keep electrical cords out of the way. Do not use floor polish or wax that makes floors slippery. If you must use wax, use non-skid floor wax. Do not have throw rugs and other things on the floor that can make you trip. What can I do with my stairs? Do not leave any items on the stairs. Make sure that there are handrails on both sides of the stairs and use them. Fix handrails that are broken or loose. Make sure that handrails are as long as the stairways. Check any carpeting to make sure that it is firmly attached to the stairs. Fix any carpet that is loose or worn. Avoid having throw rugs at the top or bottom of the stairs. If you do have throw rugs,  attach them to the floor with carpet tape. Make sure that you have a light switch at the top of the stairs and the bottom of the stairs. If you do not have them, ask someone to add them for you. What else can I do to help prevent falls? Wear shoes that: Do not have high heels. Have rubber bottoms. Are comfortable and fit you well. Are closed at the toe. Do not wear sandals. If you use a stepladder: Make sure that it is fully opened. Do not climb a closed stepladder. Make sure that both sides of the stepladder are locked into place. Ask someone to hold it for you, if possible. Clearly mark and make sure that you can see: Any grab bars or handrails. First and last steps. Where the  edge of each step is. Use tools that help you move around (mobility aids) if they are needed. These include: Canes. Walkers. Scooters. Crutches. Turn on the lights when you go into a dark area. Replace any light bulbs as soon as they burn out. Set up your furniture so you have a clear path. Avoid moving your furniture around. If any of your floors are uneven, fix them. If there are any pets around you, be aware of where they are. Review your medicines with your doctor. Some medicines can make you feel dizzy. This can increase your chance of falling. Ask your doctor what other things that you can do to help prevent falls. This information is not intended to replace advice given to you by your health care provider. Make sure you discuss any questions you have with your health care provider. Document Released: 01/09/2009 Document Revised: 08/21/2015 Document Reviewed: 04/19/2014 Elsevier Interactive Patient Education  2017 [employer].

## 2021-10-29 DIAGNOSIS — R809 Proteinuria, unspecified: Secondary | ICD-10-CM | POA: Diagnosis not present

## 2021-10-29 DIAGNOSIS — R3911 Hesitancy of micturition: Secondary | ICD-10-CM | POA: Diagnosis not present

## 2021-10-29 DIAGNOSIS — N179 Acute kidney failure, unspecified: Secondary | ICD-10-CM | POA: Diagnosis not present

## 2021-10-29 DIAGNOSIS — R799 Abnormal finding of blood chemistry, unspecified: Secondary | ICD-10-CM | POA: Diagnosis not present

## 2021-10-29 DIAGNOSIS — N1832 Chronic kidney disease, stage 3b: Secondary | ICD-10-CM | POA: Diagnosis not present

## 2021-10-29 DIAGNOSIS — E559 Vitamin D deficiency, unspecified: Secondary | ICD-10-CM | POA: Diagnosis not present

## 2021-10-29 DIAGNOSIS — I129 Hypertensive chronic kidney disease with stage 1 through stage 4 chronic kidney disease, or unspecified chronic kidney disease: Secondary | ICD-10-CM | POA: Diagnosis not present

## 2021-10-29 DIAGNOSIS — Z131 Encounter for screening for diabetes mellitus: Secondary | ICD-10-CM | POA: Diagnosis not present

## 2021-10-29 DIAGNOSIS — N200 Calculus of kidney: Secondary | ICD-10-CM | POA: Diagnosis not present

## 2021-11-09 ENCOUNTER — Other Ambulatory Visit: Payer: Self-pay | Admitting: Internal Medicine

## 2021-11-09 DIAGNOSIS — R112 Nausea with vomiting, unspecified: Secondary | ICD-10-CM

## 2021-11-18 DIAGNOSIS — L080 Pyoderma: Secondary | ICD-10-CM | POA: Diagnosis not present

## 2021-11-18 DIAGNOSIS — D692 Other nonthrombocytopenic purpura: Secondary | ICD-10-CM | POA: Diagnosis not present

## 2021-11-18 DIAGNOSIS — L821 Other seborrheic keratosis: Secondary | ICD-10-CM | POA: Diagnosis not present

## 2021-11-18 DIAGNOSIS — D485 Neoplasm of uncertain behavior of skin: Secondary | ICD-10-CM | POA: Diagnosis not present

## 2021-11-18 DIAGNOSIS — Z85828 Personal history of other malignant neoplasm of skin: Secondary | ICD-10-CM | POA: Diagnosis not present

## 2021-11-18 DIAGNOSIS — D225 Melanocytic nevi of trunk: Secondary | ICD-10-CM | POA: Diagnosis not present

## 2021-11-18 DIAGNOSIS — D1722 Benign lipomatous neoplasm of skin and subcutaneous tissue of left arm: Secondary | ICD-10-CM | POA: Diagnosis not present

## 2021-11-18 DIAGNOSIS — D1721 Benign lipomatous neoplasm of skin and subcutaneous tissue of right arm: Secondary | ICD-10-CM | POA: Diagnosis not present

## 2021-11-18 DIAGNOSIS — D2272 Melanocytic nevi of left lower limb, including hip: Secondary | ICD-10-CM | POA: Diagnosis not present

## 2021-11-23 DIAGNOSIS — D485 Neoplasm of uncertain behavior of skin: Secondary | ICD-10-CM | POA: Diagnosis not present

## 2021-11-23 DIAGNOSIS — L988 Other specified disorders of the skin and subcutaneous tissue: Secondary | ICD-10-CM | POA: Diagnosis not present

## 2021-12-28 ENCOUNTER — Telehealth: Payer: Self-pay

## 2021-12-28 NOTE — Progress Notes (Signed)
Chronic Care Management Pharmacy Assistant   Name: Devin Howell  MRN: [id number] DOB: 1950/12/13  Reason for Encounter: CCM (Hypertension Disease State)  Recent office visits:  09/16/21 AWV  07/26/21 Alma Friendly, NP CKD No other information  Recent consult visits:  None since last CCM contact  Hospital visits:  Medication Reconciliation was completed by comparing discharge summary, patient's EMR and Pharmacy list, and upon discussion with patient.  Admitted to the hospital on 07/19/21 due to fall. Discharge date was 07/19/21. Discharged from [facility]    71 year old male who presents to the ED today status post fall.  Apparently fell twice.  Wife concerned as she reports he is not acting normal.  Per EMS heavy amount of EtOH on board however patient only reports drinking 1 glass of wine today around noon and wife confirms same.  On arrival to the ED vitals are stable.  Patient without any focal neurodeficits on exam at this time.  Does not appear consistent with stroke.  However wife does report head injury with fall.  We will plan for CT head and CT C-spine for further evaluation.  CBG on arrival at 20.  We will plan for labs as well as EtOH.  We will continue to monitor in the ED.  He is noted to have a skin tear to his left forearm as well as an abrasion to his left knee, will plan for x-rays of same.  Tetanus is up-to-date.  We will plan for wound care prior to discharge.    CT head and CT C-spine negative at this time.  No bleed. X-rays negative. CBC with platelets 111. Suspect s/2 alcohol use. Hgb 12.8 Creatinine stable and improved from previous at 1.63.  EtOH significantly elevated at 425.  Will have patient sober up at this time prior to discharge.   Pt monitored in the ED for 3.5 hours. He has been able to ambulate successfully without assistance. Will discharge home at this time. Wife is at bedside and will drive him home. Pt advised to follow up with PCP  for further evaluation. Stable for discharge at this time.    Medications that remain the same after Hospital Discharge:??  -All other medications will remain the same.    Medications: Outpatient Encounter Medications as of 12/28/2021  Medication Sig   allopurinol (ZYLOPRIM) 300 MG tablet TAKE 1 TABLET BY MOUTH DAILY FOR GOUT PREVENTION.   amLODipine (NORVASC) 5 MG tablet Take 5 mg by mouth daily.   atorvastatin (LIPITOR) 20 MG tablet TAKE 1 TABLET BY MOUTH DAILY. FOR CHOLESTEROL   citalopram (CELEXA) 20 MG tablet TAKE 1 TABLET (20 MG TOTAL) BY MOUTH DAILY. FOR ANXIETY AND DEPRESSION.   hydrALAZINE (APRESOLINE) 10 MG tablet Take 10 mg by mouth 2 (two) times daily.   hydrALAZINE (APRESOLINE) 25 MG tablet Take 25 mg by mouth 2 (two) times daily.   levothyroxine (SYNTHROID) 50 MCG tablet TAKE 1 TAB BY MOUTH EVERY MORNING ON EMPTY STOMACH WITH WATER ONLY NO FOOD/OTHER MEDS FOR 30 MINS   losartan (COZAAR) 25 MG tablet Take 25 mg by mouth daily.   Magnesium 250 MG TABS Take 1 tablet by mouth once a week.   metoprolol succinate (TOPROL-XL) 50 MG 24 hr tablet Take 50 mg by mouth daily.   ondansetron (ZOFRAN ODT) 4 MG disintegrating tablet Take 1 tablet (4 mg total) by mouth every 8 (eight) hours as needed for nausea or vomiting.   pantoprazole (PROTONIX) 40 MG tablet TAKE 1  TABLET BY MOUTH TWICE A DAY   tamsulosin (FLOMAX) 0.4 MG CAPS capsule Take 0.4 mg by mouth at bedtime.   testosterone cypionate (DEPOTESTOSTERONE CYPIONATE) 200 MG/ML injection Inject 100 mg into the muscle once a week.   traZODone (DESYREL) 50 MG tablet TAKE 1 TABLET (50 MG TOTAL) BY MOUTH AT BEDTIME. FOR SLEEP.   vitamin B-12 (CYANOCOBALAMIN) 1000 MCG tablet Take 1,000 mcg by mouth once a week.   No facility-administered encounter medications on file as of 12/28/2021.    Recent Office Vitals: BP Readings from Last 3 Encounters:  07/19/21 100/85  07/07/21 137/78  03/09/21 (!) 162/88   Pulse Readings from Last 3  Encounters:  07/19/21 61  03/09/21 62  02/26/21 72    Wt Readings from Last 3 Encounters:  09/16/21 238 lb (108 kg)  03/09/21 238 lb (108 kg)  02/26/21 231 lb 1 oz (104.8 kg)     Kidney Function Lab Results  Component Value Date/Time   CREATININE 1.63 (H) 07/19/2021 07:36 PM   CREATININE 1.84 (H) 02/04/2021 11:31 AM   CREATININE 1.39 (H) 09/15/2020 11:24 AM   CREATININE 2.94 (H) 01/20/2018 02:51 PM   GFR 36.35 (L) 02/04/2021 11:31 AM   GFRNONAA 44 (L) 07/19/2021 07:36 PM   GFRNONAA 54 (L) 09/15/2020 11:24 AM   GFRAA 52 (L) 09/13/2017 12:48 PM       Latest Ref Rng & Units 07/19/2021    7:36 PM 02/04/2021   11:31 AM 12/24/2020    2:31 PM  BMP  Glucose 70 - 99 mg/dL 85  89  102   BUN 8 - 23 mg/dL '27  28  24   '$ Creatinine 0.61 - 1.24 mg/dL 1.63  1.84  1.73   Sodium 135 - 145 mmol/L 136  143  139   Potassium 3.5 - 5.1 mmol/L 4.2  4.3  3.6   Chloride 98 - 111 mmol/L 99  100  92   CO2 22 - 32 mmol/L 22  32  35   Calcium 8.9 - 10.3 mg/dL 8.4  9.4  9.9     Contacted patient on 12/28/2021 to discuss hypertension disease state  Current antihypertensive regimen:  Metoprolol succinate 50 mg daily  Losartan 25 mg daily (nephrology)  Patient verbally confirms he is taking the above medications as directed. Yes  How often are you checking your Blood Pressure? One time a week  Current home BP readings: I have asked patient to take their blood pressure daily and keep a log. Patient advised he would put his log on my chart for me to access.   Wrist or arm cuff: Arm cuff Caffeine intake: 1 cup of coffee a day and 1 Diet Coke every other day Salt intake: Does not use salt Over the counter medications including pseudoephedrine or NSAIDs? None  Any readings above 180/120? No  What recent interventions/DTPs have been made by any provider to improve Blood Pressure control since last CPP Visit: Continue current medications  Any recent hospitalizations or ED visits since last visit with  CPP? Yes  What exercise is being done to improve your Blood Pressure Control?  Patient walks 2 miles a day - six days a week.  Adherence Review: Is the patient currently on ACE/ARB medication? Yes Does the patient have >5 day gap between last estimated fill dates? No  Summary of recommendations from last [place] visit (Date:07/07/2021)  Summary: CCM Initial visit -Pt was started on losartan 25 mg daily Feb 2023 per nephrology. BP 137/78  at home. -GFR improved to 49 in Feb 2023 per Nephrology notes -Pt is no longer taking pantoprazole, he decided he no longer needed it -Pt is taking B12 once weekly now (B12 > 1000 in March)   Recommendations/Changes made from today's visit: -No med changes (updated med list)   Plan: -[facility] will call patient 6 months for general update -Pharmacist follow up televisit scheduled for 1 year -[facility] AWV 09/16/21  Star Rating Drugs:  Medication:  Last Fill: Day Supply Atorvastatin 20 mg 11/06/2021 90 Losartan 25 mg 11/10/2021 90  Care Gaps: Annual wellness visit in last year? Yes 09/16/2021 Most Recent BP reading: 100/85 on 07/19/2021  Upcoming appointments: CCM appointment on 07/07/2021  Charlene Brooke, CPP notified  Marijean Niemann, [place] Assistant [phone]

## 2022-01-12 DIAGNOSIS — G4733 Obstructive sleep apnea (adult) (pediatric): Secondary | ICD-10-CM | POA: Diagnosis not present

## 2022-02-04 ENCOUNTER — Other Ambulatory Visit: Payer: Self-pay | Admitting: Primary Care

## 2022-02-04 DIAGNOSIS — E039 Hypothyroidism, unspecified: Secondary | ICD-10-CM

## 2022-02-04 DIAGNOSIS — F32A Depression, unspecified: Secondary | ICD-10-CM

## 2022-02-04 DIAGNOSIS — E782 Mixed hyperlipidemia: Secondary | ICD-10-CM

## 2022-02-12 DIAGNOSIS — G4733 Obstructive sleep apnea (adult) (pediatric): Secondary | ICD-10-CM | POA: Diagnosis not present

## 2022-03-02 ENCOUNTER — Ambulatory Visit (INDEPENDENT_AMBULATORY_CARE_PROVIDER_SITE_OTHER): Payer: Medicare HMO | Admitting: Primary Care

## 2022-03-02 ENCOUNTER — Encounter: Payer: Self-pay | Admitting: Primary Care

## 2022-03-02 VITALS — BP 162/86 | HR 73 | Temp 98.8°F | Ht 68.0 in | Wt 236.0 lb

## 2022-03-02 DIAGNOSIS — M1A9XX0 Chronic gout, unspecified, without tophus (tophi): Secondary | ICD-10-CM

## 2022-03-02 DIAGNOSIS — N183 Chronic kidney disease, stage 3 unspecified: Secondary | ICD-10-CM | POA: Diagnosis not present

## 2022-03-02 DIAGNOSIS — E7800 Pure hypercholesterolemia, unspecified: Secondary | ICD-10-CM

## 2022-03-02 DIAGNOSIS — I10 Essential (primary) hypertension: Secondary | ICD-10-CM

## 2022-03-02 DIAGNOSIS — Z125 Encounter for screening for malignant neoplasm of prostate: Secondary | ICD-10-CM | POA: Diagnosis not present

## 2022-03-02 DIAGNOSIS — F419 Anxiety disorder, unspecified: Secondary | ICD-10-CM

## 2022-03-02 DIAGNOSIS — E039 Hypothyroidism, unspecified: Secondary | ICD-10-CM | POA: Diagnosis not present

## 2022-03-02 DIAGNOSIS — R69 Illness, unspecified: Secondary | ICD-10-CM | POA: Diagnosis not present

## 2022-03-02 DIAGNOSIS — F32A Depression, unspecified: Secondary | ICD-10-CM

## 2022-03-02 DIAGNOSIS — F1020 Alcohol dependence, uncomplicated: Secondary | ICD-10-CM

## 2022-03-02 DIAGNOSIS — Z0000 Encounter for general adult medical examination without abnormal findings: Secondary | ICD-10-CM

## 2022-03-02 LAB — PSA, MEDICARE: PSA: 0.76 ng/ml (ref 0.10–4.00)

## 2022-03-02 LAB — COMPREHENSIVE METABOLIC PANEL
ALT: 16 U/L (ref 0–53)
AST: 16 U/L (ref 0–37)
Albumin: 4.3 g/dL (ref 3.5–5.2)
Alkaline Phosphatase: 71 U/L (ref 39–117)
BUN: 26 mg/dL — ABNORMAL HIGH (ref 6–23)
CO2: 29 mEq/L (ref 19–32)
Calcium: 9.5 mg/dL (ref 8.4–10.5)
Chloride: 104 mEq/L (ref 96–112)
Creatinine, Ser: 1.86 mg/dL — ABNORMAL HIGH (ref 0.40–1.50)
GFR: 35.61 mL/min — ABNORMAL LOW (ref >60.00)
Glucose, Bld: 87 mg/dL (ref 70–99)
Potassium: 4.8 mEq/L (ref 3.5–5.1)
Sodium: 140 mEq/L (ref 135–145)
Total Bilirubin: 0.8 mg/dL (ref 0.2–1.2)
Total Protein: 6.9 g/dL (ref 6.0–8.3)

## 2022-03-02 LAB — HEMOGLOBIN A1C: Hgb A1c MFr Bld: 4.9 % (ref 4.6–6.5)

## 2022-03-02 LAB — LIPID PANEL
Cholesterol: 200 mg/dL (ref 0–200)
HDL: 73.9 mg/dL (ref >39.00)
LDL Cholesterol: 99 mg/dL (ref 0–99)
NonHDL: 126.28
Total CHOL/HDL Ratio: 3
Triglycerides: 137 mg/dL (ref 0.0–149.0)
VLDL: 27.4 mg/dL (ref 0.0–40.0)

## 2022-03-02 LAB — TSH: TSH: 2.55 u[IU]/mL (ref 0.35–5.50)

## 2022-03-02 LAB — URIC ACID: Uric Acid, Serum: 4.4 mg/dL (ref 4.0–7.8)

## 2022-03-02 NOTE — Assessment & Plan Note (Signed)
Above goal in the office today, home readings are better.  Question if he is taking all of his blood pressure medication. He will return home and notify us.  For now, continue metoprolol succinate 50 mg daily for which he does believe he is taking.

## 2022-03-02 NOTE — Progress Notes (Signed)
Subjective:    Patient ID: Devin Howell, male    DOB: 10/26/1950, 71 y.o.   MRN: [id number]  HPI  Devin Howell is a very pleasant 71 y.o. male who presents today for complete physical and follow up of chronic conditions.  Immunizations: -Tetanus: 2018 -Influenza: Completed this season  -Shingles: Completed Shingrix -Pneumonia: Prevnar 20 in 2022, Pneumovax in 2018, Prevnar 13 in 2016  Diet: [place].  Exercise: Exercising 6 days weekly   Eye exam: Completes annually  Dental exam: Completes semi-annually   Colonoscopy: Completed in 2019, due 2024  PSA: Due   BP Readings from Last 3 Encounters:  03/02/22 (!) 162/86  07/19/21 100/85  07/07/21 137/78   He is checking BP at home   Wt Readings from Last 3 Encounters:  03/02/22 236 lb (107 kg)  09/16/21 238 lb (108 kg)  03/09/21 238 lb (108 kg)      Review of Systems  Constitutional:  Negative for unexpected weight change.  HENT:  Negative for rhinorrhea.   Respiratory:  Negative for cough and shortness of breath.   Cardiovascular:  Negative for chest pain.  Gastrointestinal:  Negative for constipation and diarrhea.  Genitourinary:  Negative for difficulty urinating.  Musculoskeletal:  Positive for arthralgias.  Skin:  Negative for rash.  Allergic/Immunologic: Negative for environmental allergies.  Neurological:  Negative for dizziness and headaches.         Past Medical History:  Diagnosis Date   AKI (acute kidney injury) ([place]) 08/15/2020   Cataract    Chronic gout    CKD (chronic kidney disease)    pt. denies   Erectile dysfunction    GERD (gastroesophageal reflux disease)    Herpes simplex    Hx of adenomatous polyp of colon 04/12/2017   Hyperlipidemia    pt. denies   Hypertension    pt. denies   OSA on CPAP    Sleep apnea    cpap   Subarachnoid hematoma (HCC)    Subdural hematoma (HCC)    Testosterone deficiency     Social History   Socioeconomic History   Marital status:  Married    Spouse name: Not on file   Number of children: Not on file   Years of education: Not on file   Highest education level: Bachelor's degree (e.g., BA, AB, BS)  Occupational History   Not on file  Tobacco Use   Smoking status: Never    Passive exposure: Never   Smokeless tobacco: Never  Vaping Use   Vaping Use: Never used  Substance and Sexual Activity   Alcohol use: Yes    Alcohol/week: 2.0 standard drinks of alcohol    Types: 2 Glasses of wine per week   Drug use: No   Sexual activity: Not Currently  Other Topics Concern   Not on file  Social History Narrative   Lives with wife at home    Right handed    Some caffeine intake    Social Determinants of Health   Financial Resource Strain: Low Risk  (09/16/2021)   Overall Financial Resource Strain (CARDIA)    Difficulty of Paying Living Expenses: Not hard at all  Food Insecurity: No Food Insecurity (09/16/2021)   Hunger Vital Sign    Worried About Running Out of Food in the Last Year: Never true    Ran Out of Food in the Last Year: Never true  Transportation Needs: No Transportation Needs (09/16/2021)   PRAPARE - Transportation    Lack  of Transportation (Medical): No    Lack of Transportation (Non-Medical): No  Physical Activity: Sufficiently Active (09/16/2021)   Exercise Vital Sign    Days of Exercise per Week: 5 days    Minutes of Exercise per Session: 40 min  Stress: No Stress Concern Present (09/16/2021)   [place]    Feeling of Stress : Not at all  Social Connections: Socially Isolated (09/16/2021)   Social Connection and Isolation Panel [NHANES]    Frequency of Communication with Friends and Family: Once a week    Frequency of Social Gatherings with Friends and Family: Never    Attends Religious Services: Never    Marine scientist or Organizations: No    Attends Archivist Meetings: Never    Marital Status: Married   Human resources officer Violence: Not At Risk (09/16/2021)   Humiliation, Afraid, Rape, and Kick questionnaire    Fear of Current or Ex-Partner: No    Emotionally Abused: No    Physically Abused: No    Sexually Abused: No    Past Surgical History:  Procedure Laterality Date   COLONOSCOPY     ESOPHAGOGASTRODUODENOSCOPY     TONSILLECTOMY AND ADENOIDECTOMY  1960    Family History  Problem Relation Age of Onset   Rectal cancer Mother    Heart Problems Father    Colon polyps Neg Hx    Esophageal cancer Neg Hx    Colon cancer Neg Hx    Stomach cancer Neg Hx     No Active Allergies  Current Outpatient Medications on File Prior to Visit  Medication Sig Dispense Refill   allopurinol (ZYLOPRIM) 300 MG tablet TAKE 1 TABLET BY MOUTH DAILY FOR GOUT PREVENTION. 90 tablet 2   atorvastatin (LIPITOR) 20 MG tablet TAKE 1 TABLET BY MOUTH EVERY DAY FOR CHOLESTEROL 90 tablet 0   citalopram (CELEXA) 20 MG tablet TAKE 1 TABLET (20 MG TOTAL) BY MOUTH DAILY. FOR ANXIETY AND DEPRESSION. 90 tablet 3   levothyroxine (SYNTHROID) 50 MCG tablet TAKE 1 TAB BY MOUTH EVERY MORNING ON EMPTY STOMACH WITH WATER ONLY NO FOOD/OTHER MEDS FOR 30 MINS 90 tablet 0   Magnesium 250 MG TABS Take 1 tablet by mouth once a week.     metoprolol succinate (TOPROL-XL) 50 MG 24 hr tablet Take 50 mg by mouth daily.     tamsulosin (FLOMAX) 0.4 MG CAPS capsule Take 0.4 mg by mouth at bedtime.     testosterone cypionate (DEPOTESTOSTERONE CYPIONATE) 200 MG/ML injection Inject 100 mg into the muscle once a week.     traZODone (DESYREL) 50 MG tablet TAKE 1 TABLET (50 MG TOTAL) BY MOUTH AT BEDTIME. FOR SLEEP. 90 tablet 0   vitamin B-12 (CYANOCOBALAMIN) 1000 MCG tablet Take 1,000 mcg by mouth once a week.     amLODipine (NORVASC) 5 MG tablet Take 5 mg by mouth daily. (Patient not taking: Reported on 03/02/2022)     hydrALAZINE (APRESOLINE) 10 MG tablet Take 10 mg by mouth 2 (two) times daily.     hydrALAZINE (APRESOLINE) 25 MG tablet Take 25  mg by mouth 2 (two) times daily. (Patient not taking: Reported on 03/02/2022)     losartan (COZAAR) 25 MG tablet Take 25 mg by mouth daily.     No current facility-administered medications on file prior to visit.    BP (!) 162/86   Pulse 73   Temp 98.8 F (37.1 C) (Temporal)   Ht '5\' 8"'$  (1.727 m)  Wt 236 lb (107 kg)   SpO2 97%   BMI 35.88 kg/m  Objective:   Physical Exam HENT:     Right Ear: Tympanic membrane and ear canal normal.     Left Ear: Tympanic membrane and ear canal normal.     Nose: Nose normal.     Right Sinus: No maxillary sinus tenderness or frontal sinus tenderness.     Left Sinus: No maxillary sinus tenderness or frontal sinus tenderness.  Eyes:     Conjunctiva/sclera: Conjunctivae normal.     Pupils: Pupils are equal, round, and reactive to light.  Neck:     Thyroid: No thyromegaly.     Vascular: No carotid bruit.  Cardiovascular:     Rate and Rhythm: Normal rate and regular rhythm.     Heart sounds: Normal heart sounds.  Pulmonary:     Effort: Pulmonary effort is normal.     Breath sounds: Normal breath sounds. No wheezing or rales.  Abdominal:     General: Bowel sounds are normal.     Palpations: Abdomen is soft.     Tenderness: There is no abdominal tenderness.  Musculoskeletal:        General: Normal range of motion.     Cervical back: Neck supple.  Skin:    General: Skin is warm and dry.  Neurological:     Mental Status: He is alert and oriented to person, place, and time.     Cranial Nerves: No cranial nerve deficit.     Deep Tendon Reflexes: Reflexes are normal and symmetric.  Psychiatric:        Mood and Affect: Mood normal.           Assessment & Plan:   Problem List Items Addressed This Visit       Cardiovascular and Mediastinum   Essential hypertension    Above goal in the office today, home readings are better.  Question if he is taking all of his blood pressure medication. He will return home and notify us.  For now,  continue metoprolol succinate 50 mg daily for which he does believe he is taking.      Relevant Orders   Hemoglobin A1c     Endocrine   Hypothyroidism - Primary    Continue levothyroxine 50 mcg tablets. Repeat TSH pending.      Relevant Orders   TSH     Genitourinary   CKD (chronic kidney disease) stage 3, GFR 30-59 ml/min (HCC)    Repeat renal function pending. Following with nephrology.        Other   Chronic gout without tophus    Controlled. Continue allopurinol 300 mg daily.  Repeat uric acid level pending.      Relevant Orders   Uric acid   Hyperlipidemia    Continue atorvastatin 20 mg daily.  Repeat lipid panel pending.      Relevant Orders   Lipid panel   Comprehensive metabolic panel   Preventative health care    Immunizations UTD. PSA due and pending. Colonoscopy up-to-date, due 2024  Discussed the importance of a healthy diet and regular exercise in order for weight loss, and to reduce the risk of further co-morbidity.  Exam stable. Labs pending.  Follow up in 1 year for repeat physical.       Anxiety and depression    Controlled.  Continue citalopram 20 mg daily.      Alcohol dependence ([place])    About the same.  3 glasses of  wine nightly.       Other Visit Diagnoses     Screening for prostate cancer       Relevant Orders   PSA, Medicare          Pleas Koch, NP

## 2022-03-02 NOTE — Assessment & Plan Note (Signed)
Continue levothyroxine 50 mcg tablets. Repeat TSH pending.

## 2022-03-02 NOTE — Assessment & Plan Note (Signed)
Controlled. Continue allopurinol 300 mg daily.  Repeat uric acid level pending.

## 2022-03-02 NOTE — Assessment & Plan Note (Signed)
About the same.  3 glasses of wine nightly.

## 2022-03-02 NOTE — Assessment & Plan Note (Signed)
Controlled. ? ?Continue citalopram 20 mg daily. ?

## 2022-03-02 NOTE — Assessment & Plan Note (Signed)
Immunizations UTD. PSA due and pending. Colonoscopy up-to-date, due 2024  Discussed the importance of a healthy diet and regular exercise in order for weight loss, and to reduce the risk of further co-morbidity.  Exam stable. Labs pending.  Follow up in 1 year for repeat physical.

## 2022-03-02 NOTE — Assessment & Plan Note (Signed)
Repeat renal function pending. Following with nephrology.

## 2022-03-02 NOTE — Assessment & Plan Note (Signed)
Continue atorvastatin 20 mg daily. Repeat lipid panel pending. 

## 2022-03-02 NOTE — Patient Instructions (Signed)
Stop by the lab prior to leaving today. I will notify you of your results once received.   Please check your medications at home as discussed today.  It was a pleasure to see you today!  Preventive Care 59 Years and Older, Male Preventive care refers to lifestyle choices and visits with your health care provider that can promote health and wellness. Preventive care visits are also called wellness exams. What can I expect for my preventive care visit? Counseling During your preventive care visit, your health care provider may ask about your: Medical history, including: Past medical problems. Family medical history. History of falls. Current health, including: Emotional well-being. Home life and relationship well-being. Sexual activity. Memory and ability to understand (cognition). Lifestyle, including: Alcohol, nicotine or tobacco, and drug use. Access to firearms. Diet, exercise, and sleep habits. Work and work Statistician. Sunscreen use. Safety issues such as seatbelt and bike helmet use. Physical exam Your health care provider will check your: Height and weight. These may be used to calculate your BMI (body mass index). BMI is a measurement that tells if you are at a healthy weight. Waist circumference. This measures the distance around your waistline. This measurement also tells if you are at a healthy weight and may help predict your risk of certain diseases, such as type 2 diabetes and high blood pressure. Heart rate and blood pressure. Body temperature. Skin for abnormal spots. What immunizations do I need?  Vaccines are usually given at various ages, according to a schedule. Your health care provider will recommend vaccines for you based on your age, medical history, and lifestyle or other factors, such as travel or where you work. What tests do I need? Screening Your health care provider may recommend screening tests for certain conditions. This may include: Lipid and  cholesterol levels. Diabetes screening. This is done by checking your blood sugar (glucose) after you have not eaten for a while (fasting). Hepatitis C test. Hepatitis B test. HIV (human immunodeficiency virus) test. STI (sexually transmitted infection) testing, if you are at risk. Lung cancer screening. Colorectal cancer screening. Prostate cancer screening. Abdominal aortic aneurysm (AAA) screening. You may need this if you are a current or former smoker. Talk with your health care provider about your test results, treatment options, and if necessary, the need for more tests. Follow these instructions at home: Eating and drinking  Eat a diet that includes fresh fruits and vegetables, whole grains, lean protein, and low-fat dairy products. Limit your intake of foods with high amounts of sugar, saturated fats, and salt. Take vitamin and mineral supplements as recommended by your health care provider. Do not drink alcohol if your health care provider tells you not to drink. If you drink alcohol: Limit how much you have to 0-2 drinks a day. Know how much alcohol is in your drink. In the [place] one drink equals one 12 oz bottle of beer (355 mL), one 5 oz glass of wine (148 mL), or one 1 oz glass of hard liquor (44 mL). Lifestyle Brush your teeth every morning and night with fluoride toothpaste. Floss one time each day. Exercise for at least 30 minutes 5 or more days each week. Do not use any products that contain nicotine or tobacco. These products include cigarettes, chewing tobacco, and vaping devices, such as e-cigarettes. If you need help quitting, ask your health care provider. Do not use drugs. If you are sexually active, practice safe sex. Use a condom or other form of protection to prevent  STIs. Take aspirin only as told by your health care provider. Make sure that you understand how much to take and what form to take. Work with your health care provider to find out whether it is safe  and beneficial for you to take aspirin daily. Ask your health care provider if you need to take a cholesterol-lowering medicine (statin). Find healthy ways to manage stress, such as: Meditation, yoga, or listening to music. Journaling. Talking to a trusted person. Spending time with friends and family. Safety Always wear your seat belt while driving or riding in a vehicle. Do not drive: If you have been drinking alcohol. Do not ride with someone who has been drinking. When you are tired or distracted. While texting. If you have been using any mind-altering substances or drugs. Wear a helmet and other protective equipment during sports activities. If you have firearms in your house, make sure you follow all gun safety procedures. Minimize exposure to UV radiation to reduce your risk of skin cancer. What's next? Visit your health care provider once a year for an annual wellness visit. Ask your health care provider how often you should have your eyes and teeth checked. Stay up to date on all vaccines. This information is not intended to replace advice given to you by your health care provider. Make sure you discuss any questions you have with your health care provider. Document Revised: 09/10/2020 Document Reviewed: 09/10/2020 Elsevier Patient Education  [place].

## 2022-03-04 DIAGNOSIS — N1831 Chronic kidney disease, stage 3a: Secondary | ICD-10-CM | POA: Diagnosis not present

## 2022-03-08 DIAGNOSIS — R3911 Hesitancy of micturition: Secondary | ICD-10-CM | POA: Diagnosis not present

## 2022-03-08 DIAGNOSIS — I129 Hypertensive chronic kidney disease with stage 1 through stage 4 chronic kidney disease, or unspecified chronic kidney disease: Secondary | ICD-10-CM | POA: Diagnosis not present

## 2022-03-08 DIAGNOSIS — N200 Calculus of kidney: Secondary | ICD-10-CM | POA: Diagnosis not present

## 2022-03-08 DIAGNOSIS — N179 Acute kidney failure, unspecified: Secondary | ICD-10-CM | POA: Diagnosis not present

## 2022-03-08 DIAGNOSIS — R809 Proteinuria, unspecified: Secondary | ICD-10-CM | POA: Diagnosis not present

## 2022-03-08 DIAGNOSIS — N1832 Chronic kidney disease, stage 3b: Secondary | ICD-10-CM | POA: Diagnosis not present

## 2022-03-14 DIAGNOSIS — G4733 Obstructive sleep apnea (adult) (pediatric): Secondary | ICD-10-CM | POA: Diagnosis not present

## 2022-03-17 ENCOUNTER — Other Ambulatory Visit: Payer: Self-pay | Admitting: Primary Care

## 2022-03-17 DIAGNOSIS — F32A Depression, unspecified: Secondary | ICD-10-CM

## 2022-04-01 ENCOUNTER — Ambulatory Visit: Payer: Medicare HMO | Admitting: Podiatry

## 2022-04-01 ENCOUNTER — Encounter: Payer: Self-pay | Admitting: Podiatry

## 2022-04-01 ENCOUNTER — Ambulatory Visit (INDEPENDENT_AMBULATORY_CARE_PROVIDER_SITE_OTHER): Payer: Medicare HMO

## 2022-04-01 VITALS — BP 145/72 | HR 71

## 2022-04-01 DIAGNOSIS — M778 Other enthesopathies, not elsewhere classified: Secondary | ICD-10-CM

## 2022-04-01 DIAGNOSIS — I10 Essential (primary) hypertension: Secondary | ICD-10-CM | POA: Diagnosis not present

## 2022-04-01 DIAGNOSIS — G4733 Obstructive sleep apnea (adult) (pediatric): Secondary | ICD-10-CM | POA: Diagnosis not present

## 2022-04-01 DIAGNOSIS — M722 Plantar fascial fibromatosis: Secondary | ICD-10-CM

## 2022-04-01 MED ORDER — TRIAMCINOLONE ACETONIDE 40 MG/ML IJ SUSP
40.0000 mg | Freq: Once | INTRAMUSCULAR | Status: AC
  Administered 2022-04-01: 40 mg

## 2022-04-01 MED ORDER — MELOXICAM 15 MG PO TABS: 15.0000 mg | ORAL_TABLET | Freq: Every day | ORAL | 3 refills | Status: DC

## 2022-04-01 MED ORDER — METHYLPREDNISOLONE 4 MG PO TBPK: ORAL_TABLET | ORAL | 0 refills | Status: DC

## 2022-04-04 NOTE — Progress Notes (Signed)
Subjective:  Patient ID: Devin Howell, male    DOB: [birth year],  MRN: [id number] HPI Chief Complaint  Patient presents with   Foot Pain    Medial foot/arch bilateral (L>R) - aching x couple months, AM pain, worse at end of the day, no treatment   New Patient (Initial Visit)    72 y.o. male presents with the above complaint.   ROS: Denies fever chills nausea vomit muscle aches pains calf pain back pain chest pain shortness of breath.  Past Medical History:  Diagnosis Date   AKI (acute kidney injury) ([place]) 08/15/2020   Cataract    Chronic gout    CKD (chronic kidney disease)    pt. denies   Erectile dysfunction    GERD (gastroesophageal reflux disease)    Herpes simplex    Hx of adenomatous polyp of colon 04/12/2017   Hyperlipidemia    pt. denies   Hypertension    pt. denies   OSA on CPAP    Sleep apnea    cpap   Subarachnoid hematoma (HCC)    Subdural hematoma (HCC)    Testosterone deficiency    Past Surgical History:  Procedure Laterality Date   COLONOSCOPY     ESOPHAGOGASTRODUODENOSCOPY     TONSILLECTOMY AND ADENOIDECTOMY  1960    Current Outpatient Medications:    meloxicam (MOBIC) 15 MG tablet, Take 1 tablet (15 mg total) by mouth daily., Disp: 30 tablet, Rfl: 3   methylPREDNISolone (MEDROL DOSEPAK) 4 MG TBPK tablet, 6 day dose pack - take as directed, Disp: 21 tablet, Rfl: 0   allopurinol (ZYLOPRIM) 300 MG tablet, TAKE 1 TABLET BY MOUTH DAILY FOR GOUT PREVENTION., Disp: 90 tablet, Rfl: 2   amLODipine (NORVASC) 5 MG tablet, Take 5 mg by mouth daily. (Patient not taking: Reported on 03/02/2022), Disp: , Rfl:    atorvastatin (LIPITOR) 20 MG tablet, TAKE 1 TABLET BY MOUTH EVERY DAY FOR CHOLESTEROL, Disp: 90 tablet, Rfl: 0   citalopram (CELEXA) 20 MG tablet, TAKE 1 TABLET (20 MG TOTAL) BY MOUTH DAILY. FOR ANXIETY AND DEPRESSION., Disp: 90 tablet, Rfl: 3   hydrALAZINE (APRESOLINE) 25 MG tablet, Take 25 mg by mouth 2 (two) times daily. (Patient not taking: Reported  on 03/02/2022), Disp: , Rfl:    levothyroxine (SYNTHROID) 50 MCG tablet, TAKE 1 TAB BY MOUTH EVERY MORNING ON EMPTY STOMACH WITH WATER ONLY NO FOOD/OTHER MEDS FOR 30 MINS, Disp: 90 tablet, Rfl: 0   Magnesium 250 MG TABS, Take 1 tablet by mouth once a week., Disp: , Rfl:    metoprolol succinate (TOPROL-XL) 50 MG 24 hr tablet, Take 50 mg by mouth daily., Disp: , Rfl:    tamsulosin (FLOMAX) 0.4 MG CAPS capsule, Take 0.4 mg by mouth at bedtime., Disp: , Rfl:    testosterone cypionate (DEPOTESTOSTERONE CYPIONATE) 200 MG/ML injection, Inject 100 mg into the muscle once a week., Disp: , Rfl:    traZODone (DESYREL) 50 MG tablet, TAKE 1 TABLET (50 MG TOTAL) BY MOUTH AT BEDTIME. FOR SLEEP., Disp: 90 tablet, Rfl: 0   vitamin B-12 (CYANOCOBALAMIN) 1000 MCG tablet, Take 1,000 mcg by mouth once a week., Disp: , Rfl:   No Known Allergies Review of Systems Objective:   Vitals:   04/01/22 1414  BP: (!) 145/72  Pulse: 71    General: Well developed, nourished, in no acute distress, alert and oriented x3   Dermatological: Skin is warm, dry and supple bilateral. Nails x 10 are well maintained; remaining integument appears unremarkable at this  time. There are no open sores, no preulcerative lesions, no rash or signs of infection present.  Vascular: Dorsalis Pedis artery and Posterior Tibial artery pedal pulses are 2/4 bilateral with immedate capillary fill time. Pedal hair growth present. No varicosities and no lower extremity edema present bilateral.   Neruologic: Grossly intact via light touch bilateral. Vibratory intact via tuning fork bilateral. Protective threshold with Semmes Wienstein monofilament intact to all pedal sites bilateral. Patellar and Achilles deep tendon reflexes 2+ bilateral. No Babinski or clonus noted bilateral.   Musculoskeletal: No gross boney pedal deformities bilateral. No pain, crepitus, or limitation noted with foot and ankle range of motion bilateral. Muscular strength 5/5 in all  groups tested bilateral.  Gait: Unassisted, Nonantalgic.    Radiographs:  Radiographs taken today demonstrate osseously mature foot significant osteoarthritis of the first metatarsophalangeal joint normal mineralization.  Dorsal spurring at the level of the first metatarsal phalangeal joint.  Small plantar distal aortic calcaneal heel spur soft tissue increase in density at the  Plantar fascial calcaneal insertion site and in the midfoot area.  Mild pes planovalgus noted.  Assessment & Plan:   Assessment: Plan fasciitis bilateral.  Osteoarthritis first metatarsophalangeal joint right.  Plan: Discussed etiology pathology and surgical therapies at this point I injected 20 mg Kenalog 5 mg Marcaine point maximal tenderness just beneath the medial navicular tuberosity area at the plantar fascial area of thickening.  Started him on methylprednisolone to be followed by meloxicam.  Discussed appropriate shoe gear he understands this is amenable to it follow-up with me in 1 month.     Devin Howell, [place]

## 2022-04-08 ENCOUNTER — Other Ambulatory Visit: Payer: Self-pay | Admitting: Primary Care

## 2022-04-08 DIAGNOSIS — M1A9XX0 Chronic gout, unspecified, without tophus (tophi): Secondary | ICD-10-CM

## 2022-04-14 DIAGNOSIS — G4733 Obstructive sleep apnea (adult) (pediatric): Secondary | ICD-10-CM | POA: Diagnosis not present

## 2022-05-02 ENCOUNTER — Other Ambulatory Visit: Payer: Self-pay | Admitting: Internal Medicine

## 2022-05-02 ENCOUNTER — Other Ambulatory Visit: Payer: Self-pay | Admitting: Primary Care

## 2022-05-02 DIAGNOSIS — R112 Nausea with vomiting, unspecified: Secondary | ICD-10-CM

## 2022-05-02 DIAGNOSIS — E039 Hypothyroidism, unspecified: Secondary | ICD-10-CM

## 2022-05-02 DIAGNOSIS — F419 Anxiety disorder, unspecified: Secondary | ICD-10-CM

## 2022-05-02 DIAGNOSIS — E782 Mixed hyperlipidemia: Secondary | ICD-10-CM

## 2022-05-13 ENCOUNTER — Encounter: Payer: Self-pay | Admitting: Podiatry

## 2022-05-13 ENCOUNTER — Ambulatory Visit: Payer: Medicare HMO | Admitting: Podiatry

## 2022-05-13 DIAGNOSIS — M722 Plantar fascial fibromatosis: Secondary | ICD-10-CM | POA: Diagnosis not present

## 2022-05-13 DIAGNOSIS — M7752 Other enthesopathy of left foot: Secondary | ICD-10-CM

## 2022-05-13 MED ORDER — TRIAMCINOLONE ACETONIDE 40 MG/ML IJ SUSP
20.0000 mg | Freq: Once | INTRAMUSCULAR | Status: AC
  Administered 2022-05-13: 20 mg

## 2022-05-13 NOTE — Progress Notes (Signed)
He presents today with his wife states that the right heel is doing fine the left foot feels almost like a sprain he states that the stepped up the other night [place] and felt severe pain in his left ankle.  He states that he is feeling approximately 50% improved for his lesion is medial longitudinal arch beneath the navicular bone.  Objective: Vital signs are stable alert oriented x 3.  Pulses are palpable.  Has no pain on palpation medial calcaneal tubercle close majority of his pain is a soft tissue lesion beneath the head of the navicular tuberosity.  He has some some reproducible pain on palpation of the sinus tarsi and left but minimal pain on palpation of the anterior talofibular ligament.  Assessment: Subtalar joint capsulitis.  Plantar fibromatosis or bursitis/Planter fasciitis.  Plan: I injected the left fibromatous region beneath the navicular tuberosity.  I would like to follow-up with him in about 1 month if not improved consider MRI.

## 2022-05-15 DIAGNOSIS — G4733 Obstructive sleep apnea (adult) (pediatric): Secondary | ICD-10-CM | POA: Diagnosis not present

## 2022-05-16 IMAGING — CT CT CERVICAL SPINE W/O CM
3 series · 13 of 33 positions shown, 16 images · non-contrast
Comparison: None.

CLINICAL DATA: fall, head injury; fell. rule out neck fx



[Series 5: c_spine 2.0 st · axial · 0.36mm/px · z∈[-227,-83]mm · 5 of 105 slices shown, 7 images]
[im 17/105  soft-tissue]
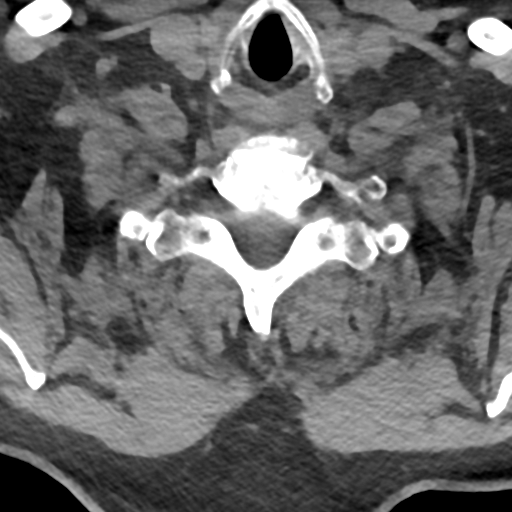
[im 17/105  bone]
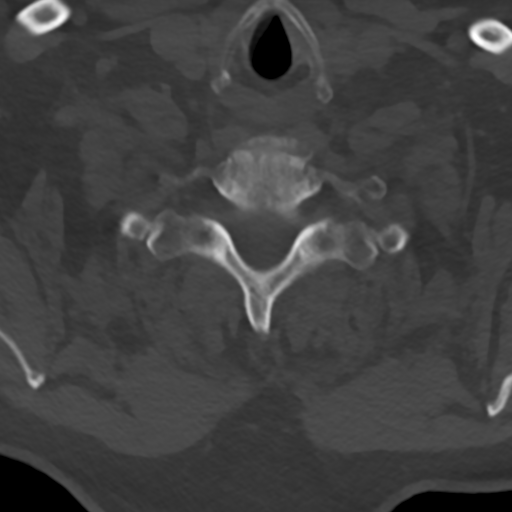
[im 33/105  bone]
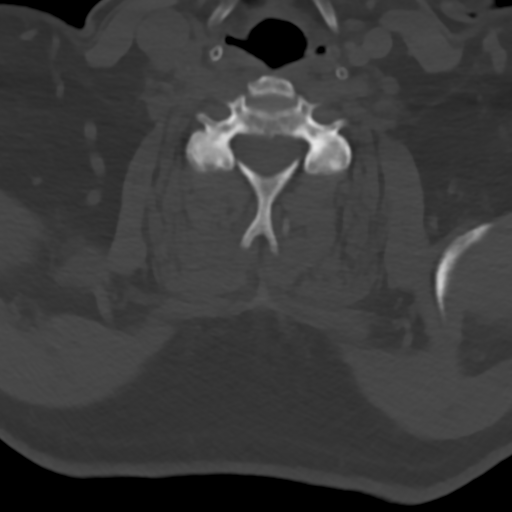
[im 57/105  bone]
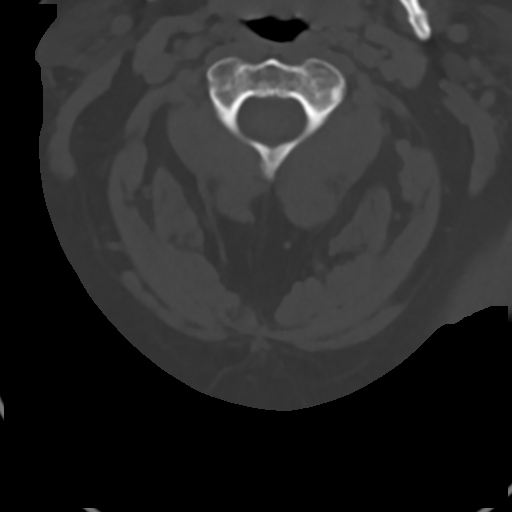
[im 73/105  bone]
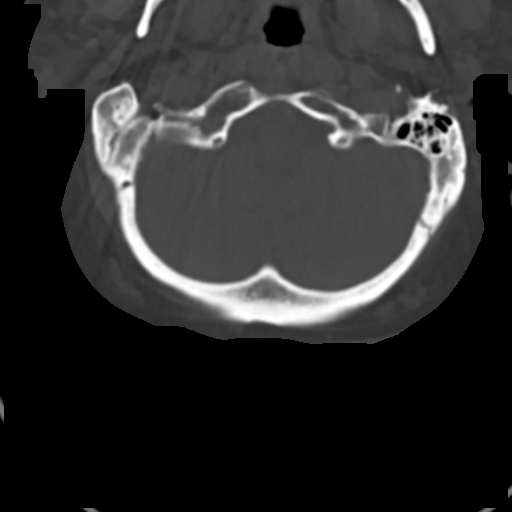
[im 89/105  soft-tissue]
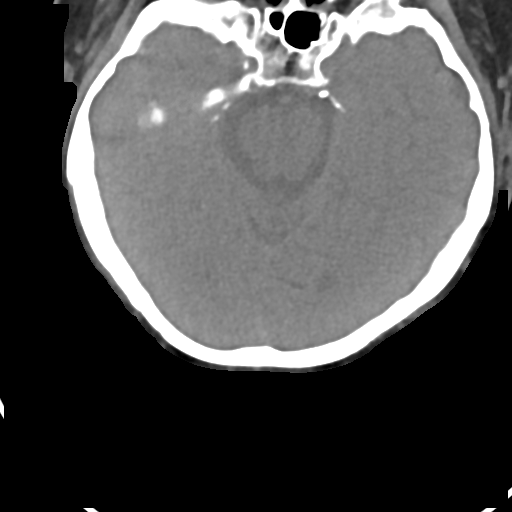
[im 89/105  bone]
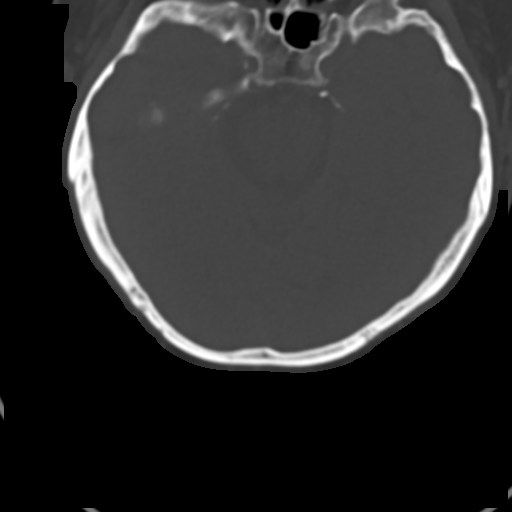

[Series 7: c_spine 2.0 sag bone · sagittal · 0.36mm/px · 5 of 49 slices shown, 6 images]
[im 17/49  bone]
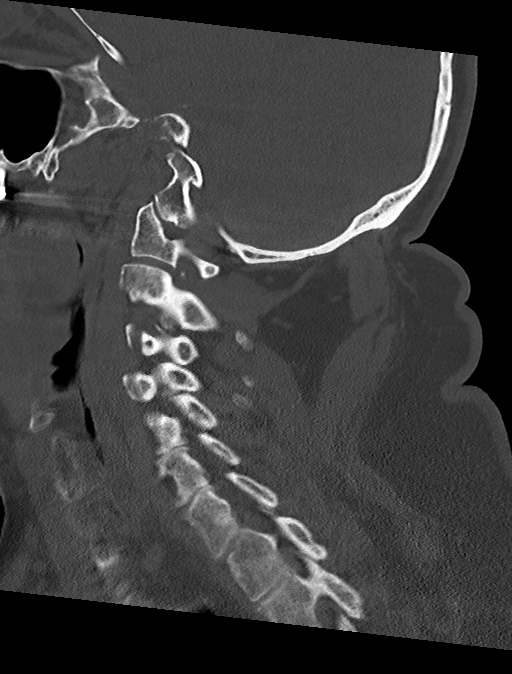
[im 21/49  bone]
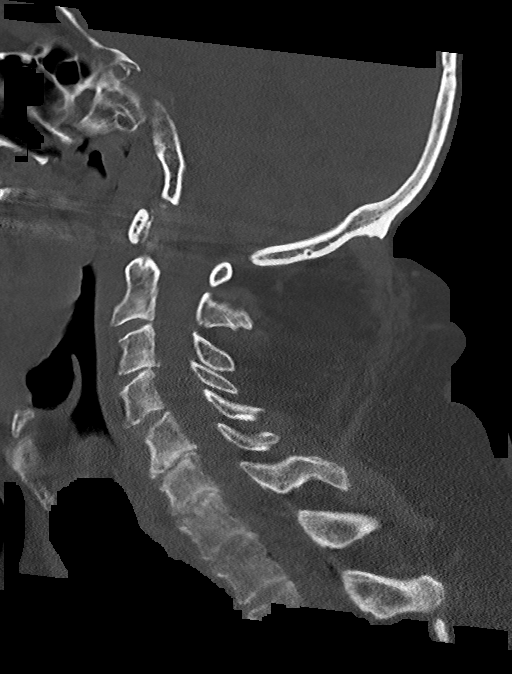
[im 25/49  soft-tissue]
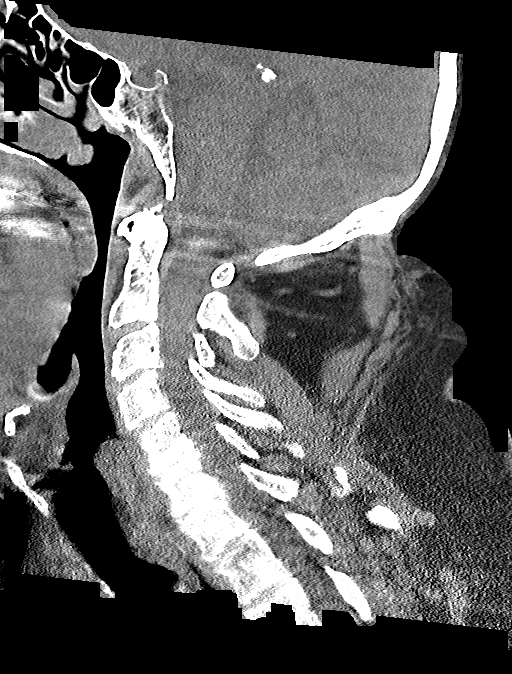
[im 25/49  bone]
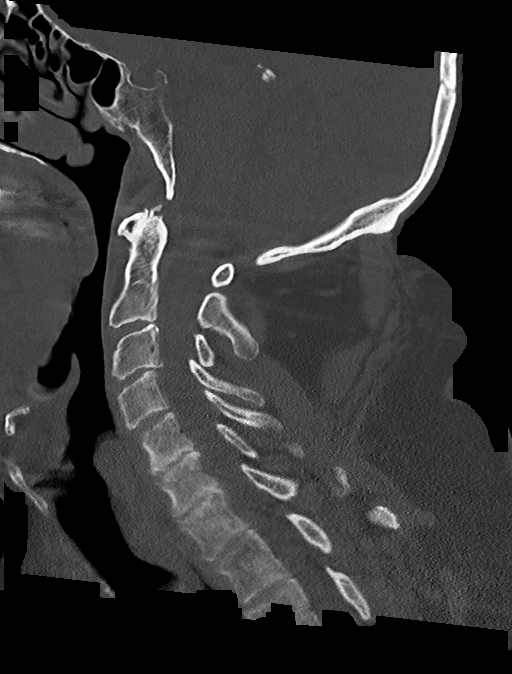
[im 29/49  bone]
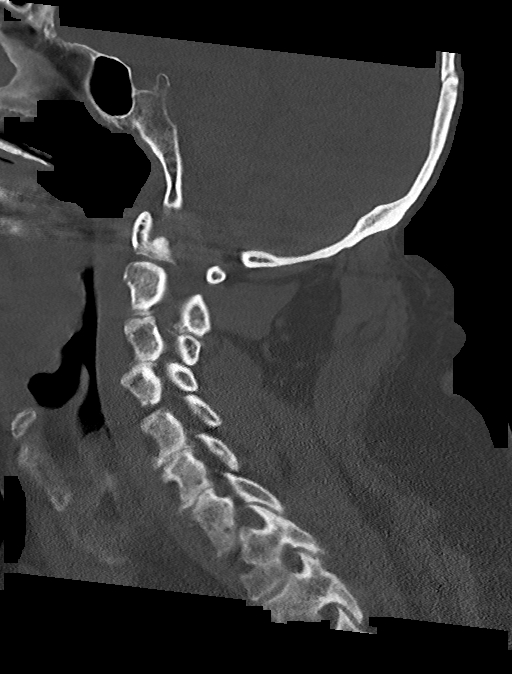
[im 33/49  bone]
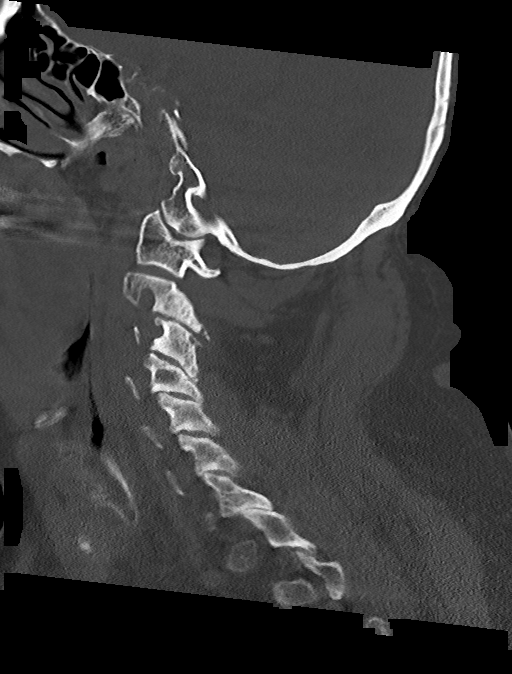

[Series 8: c_spine 2.0 cor bone · coronal · 0.34mm/px · 3 of 76 slices shown]
[im 16/76  bone]
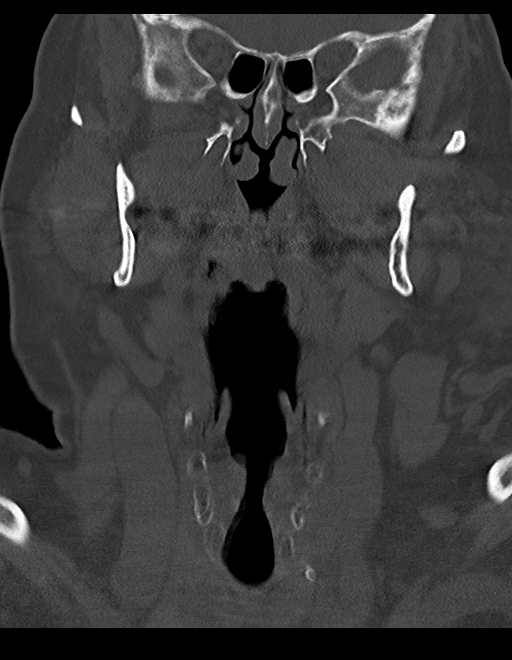
[im 31/76  bone]
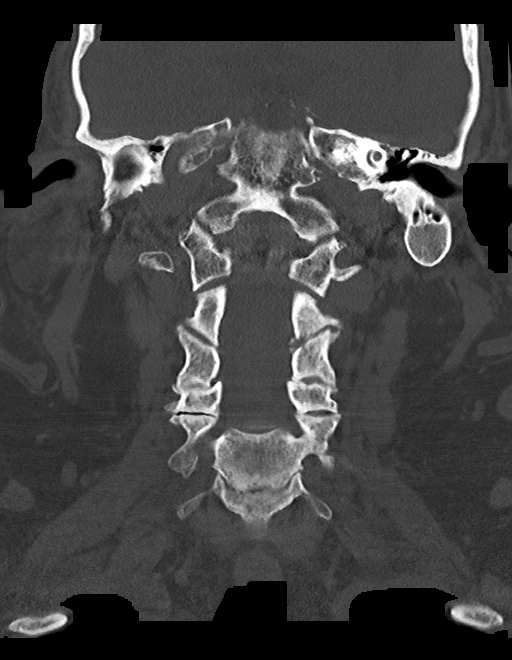
[im 46/76  bone]
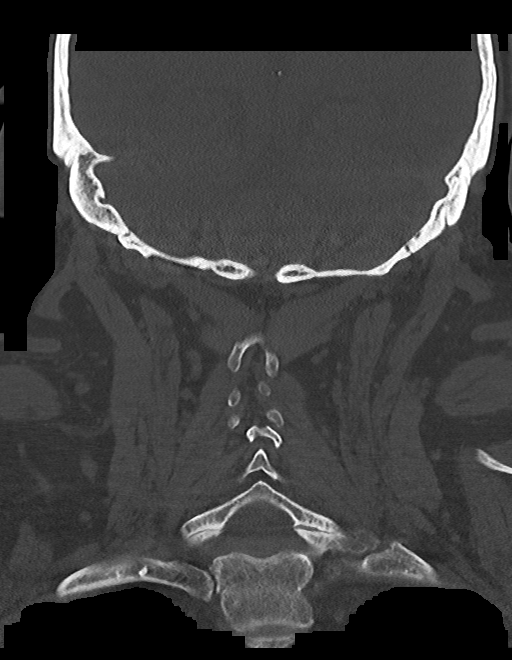

[13 of 33 positions shown; findings below may reference images not displayed]

FINDINGS: CT HEAD FINDINGS

BRAIN:
BRAIN
Mild patchy areas of decreased attenuation are noted throughout the
deep and periventricular white matter of the cerebral hemispheres
bilaterally, compatible with chronic microvascular ischemic disease.

No evidence of large-territorial acute infarction. No parenchymal
hemorrhage. No mass lesion. No extra-axial collection.

No mass effect or midline shift. No hydrocephalus. Basilar cisterns
are patent.

Vascular: No hyperdense vessel.

Skull: No acute fracture or focal lesion.

Sinuses/Orbits: Paranasal sinuses and mastoid air cells are clear.
Bilateral lens replacement. Otherwise the orbits are unremarkable.

Other: None.

CT CERVICAL SPINE FINDINGS

Alignment: Normal.

Skull base and vertebrae: Multilevel degenerative changes of the
spine. No severe osseous neural foraminal stenosis or severe osseous
central canal stenosis. No acute fracture. No aggressive appearing
focal osseous lesion or focal pathologic process.

Soft tissues and spinal canal: No prevertebral fluid or swelling. No
visible canal hematoma.

Upper chest: Unremarkable.

Other: None.
IMPRESSION: 1. No acute intracranial abnormality.
2. No acute displaced fracture or traumatic listhesis of the
cervical spine.

## 2022-05-16 IMAGING — CR DG FOREARM 2V*L*
2 series · 2 of 2 positions shown · non-contrast
Comparison: None.

CLINICAL DATA: Fall

EXAM:
LEFT FOREARM - 2 VIEW

[forearm ap]
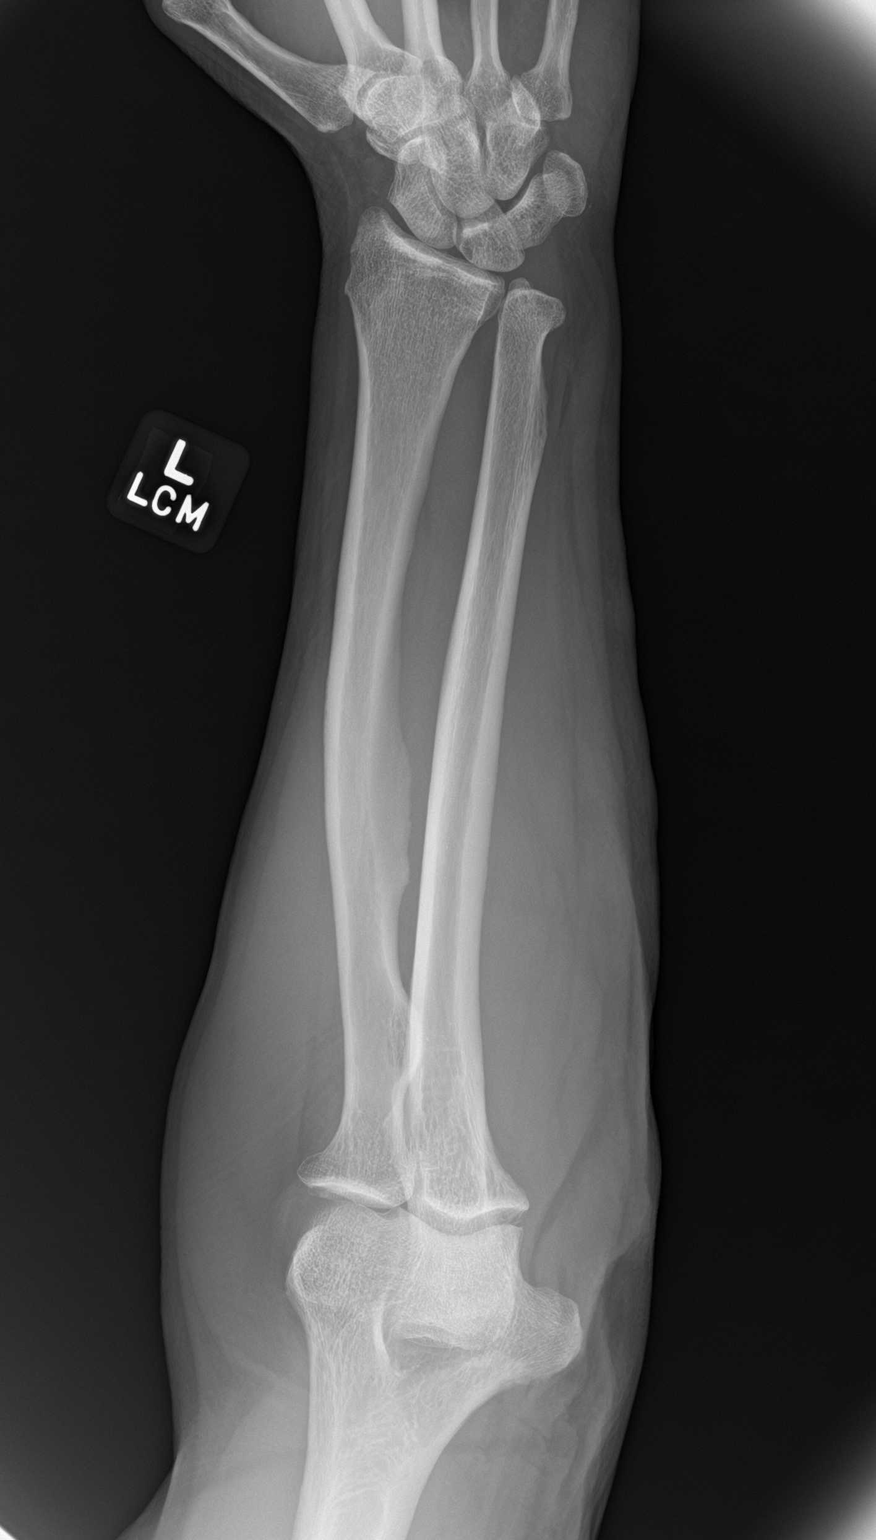

[forearm lat]
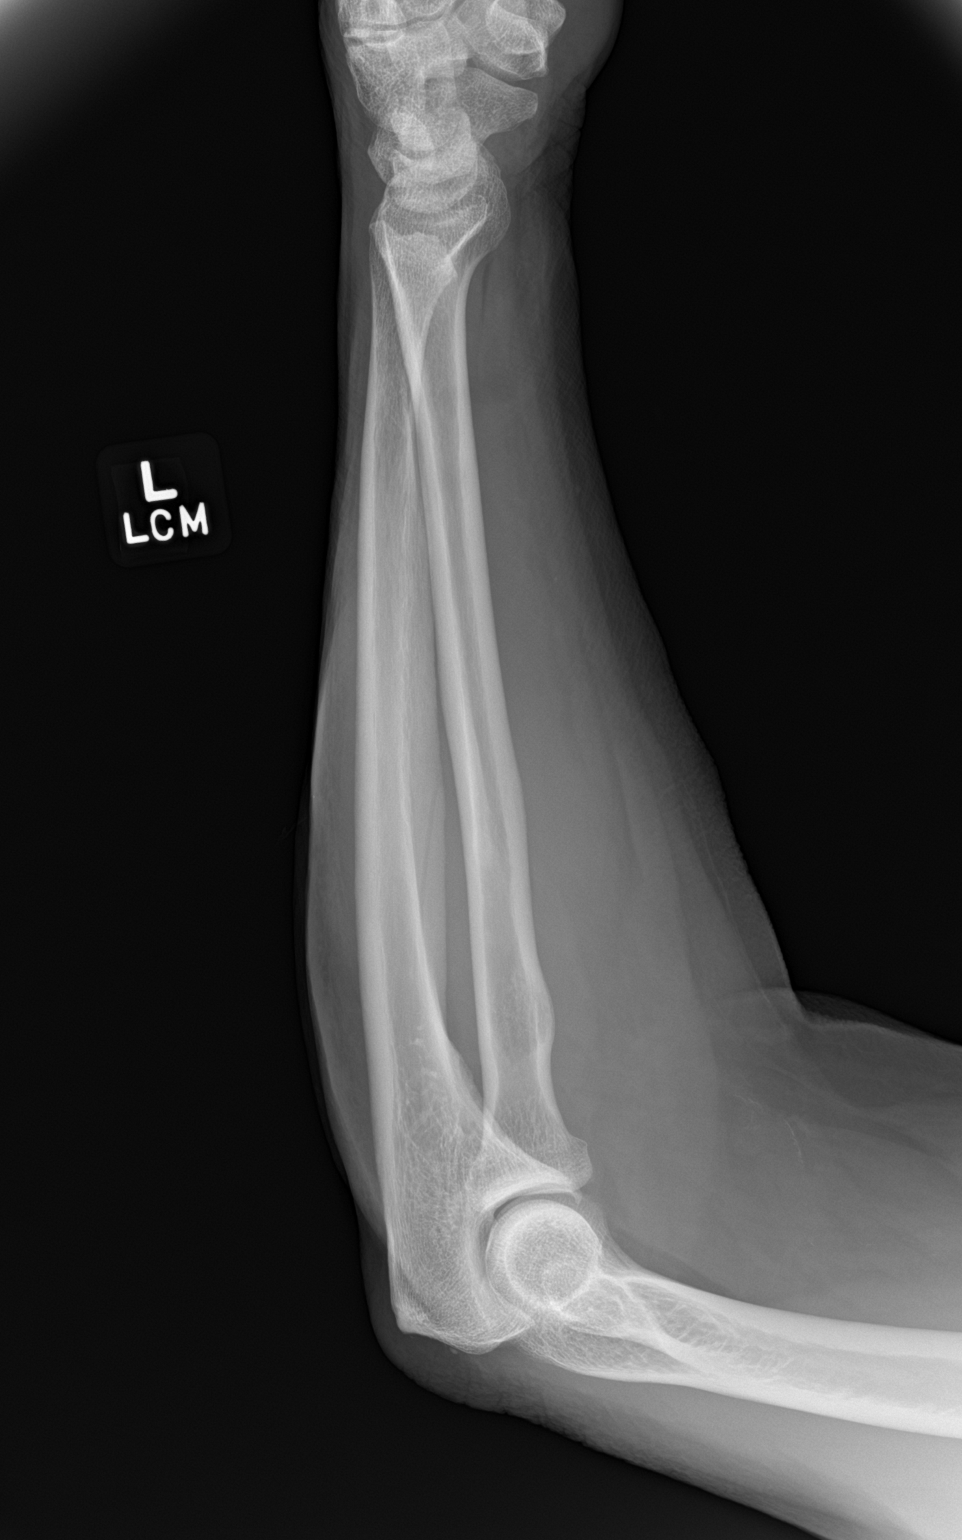

[2 of 2 positions shown; findings below may reference images not displayed]

FINDINGS: There is no evidence of fracture or other focal bone lesions.
Visualized wrist and elbow grossly unremarkable. Soft tissues are
unremarkable.
IMPRESSION: Negative.

## 2022-05-16 IMAGING — CR DG KNEE COMPLETE 4+V*L*
4 series · 4 of 4 positions shown · non-contrast
Comparison: None.

CLINICAL DATA: Fall

EXAM:
LEFT KNEE - COMPLETE 4+ VIEW

[knee ap]
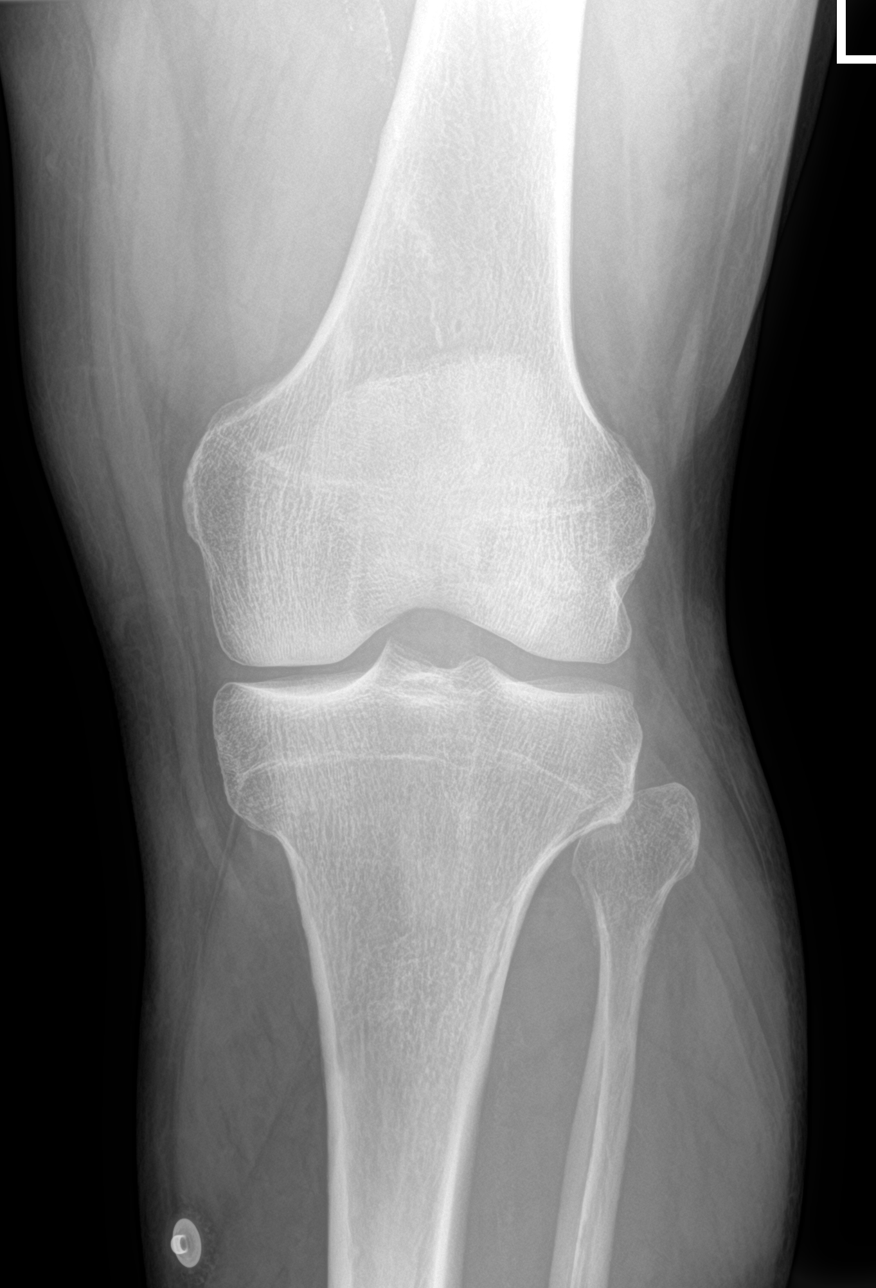

[knee lat]
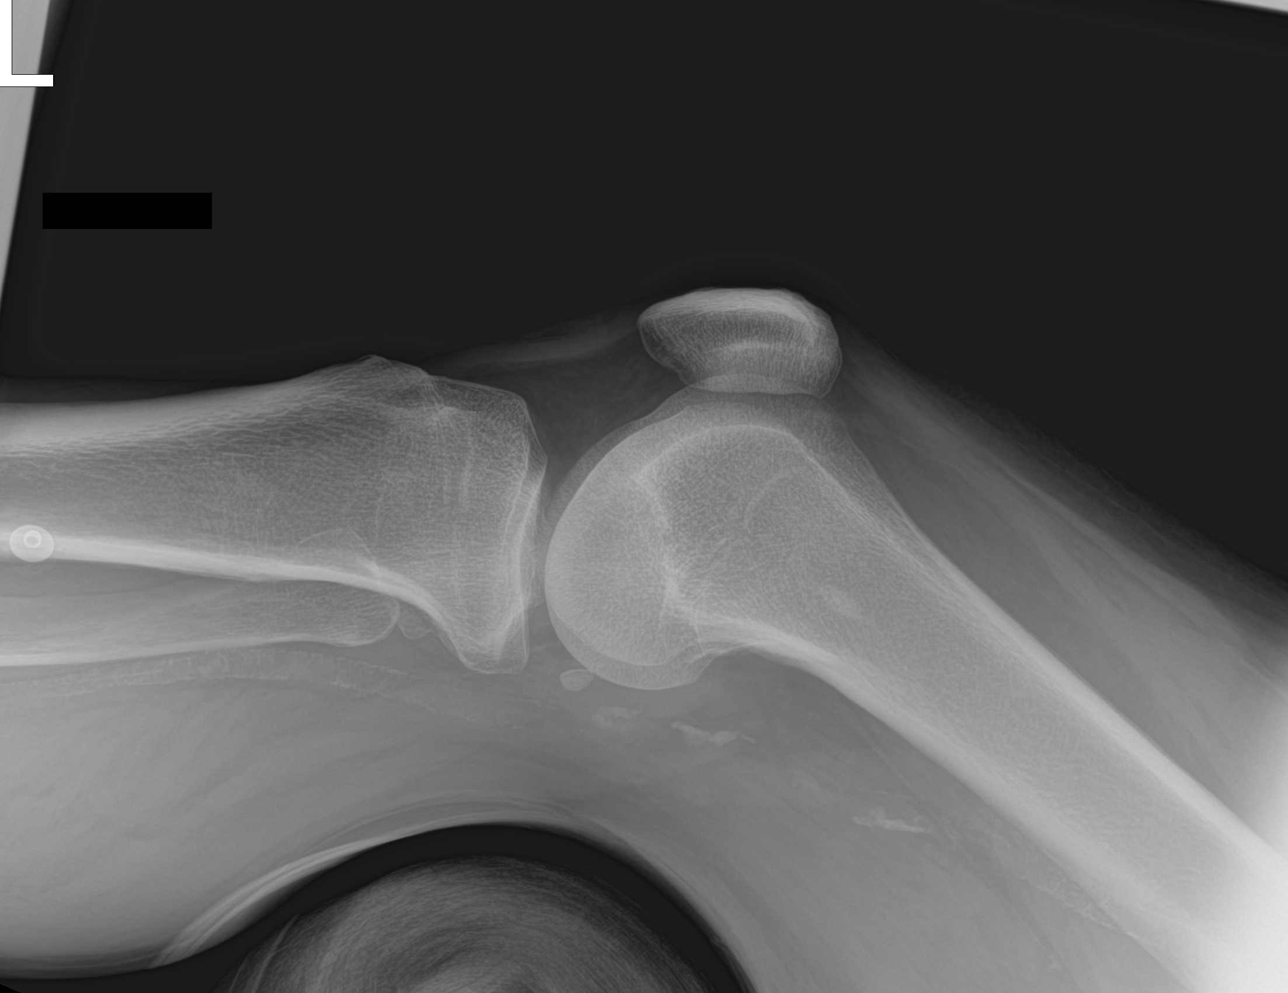

[knee obl (1 of 2)]
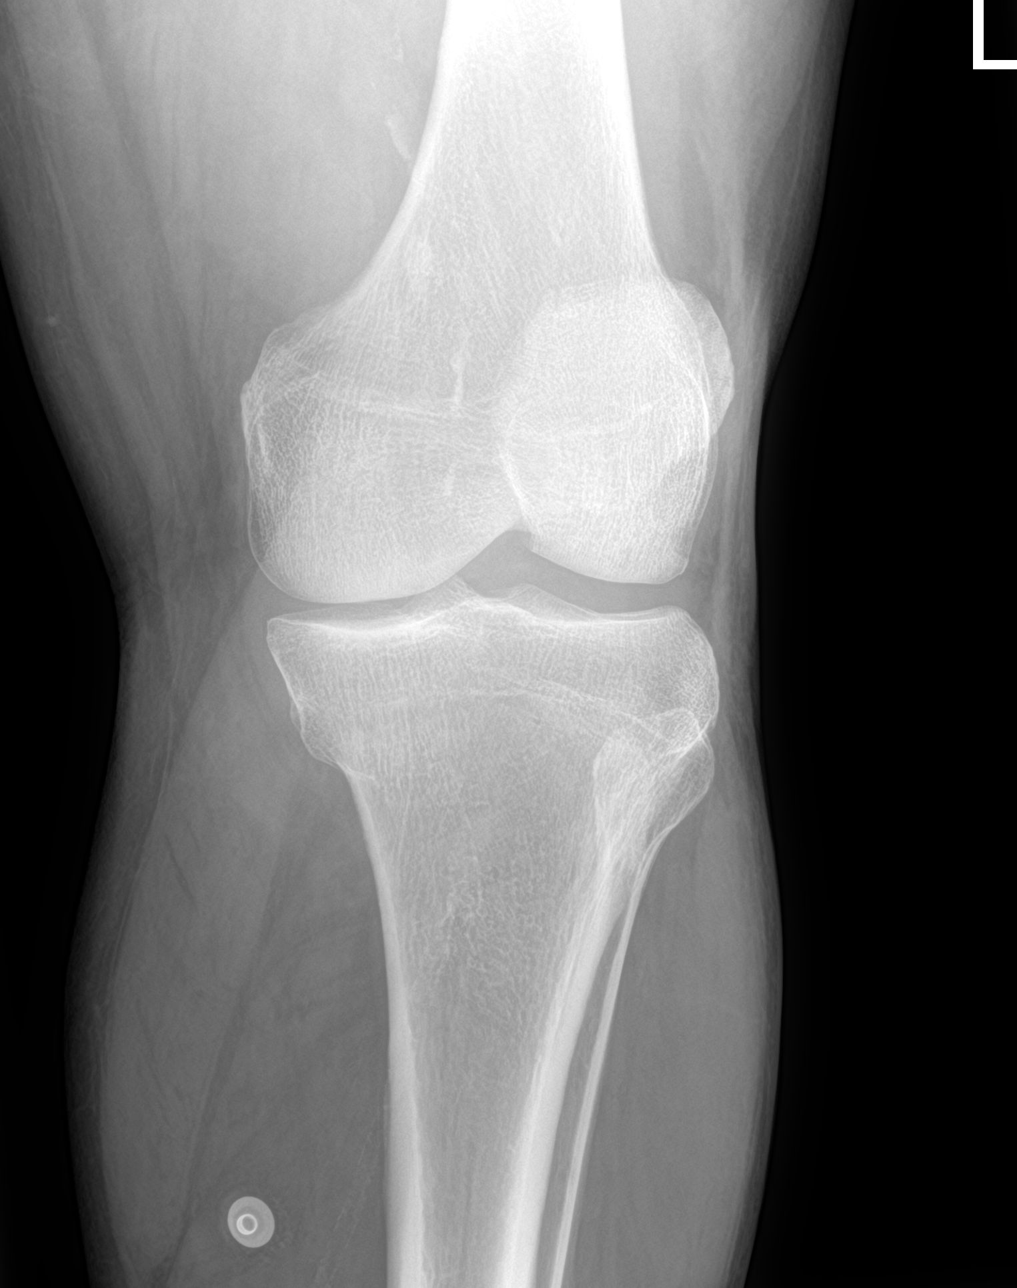

[knee obl (2 of 2)]
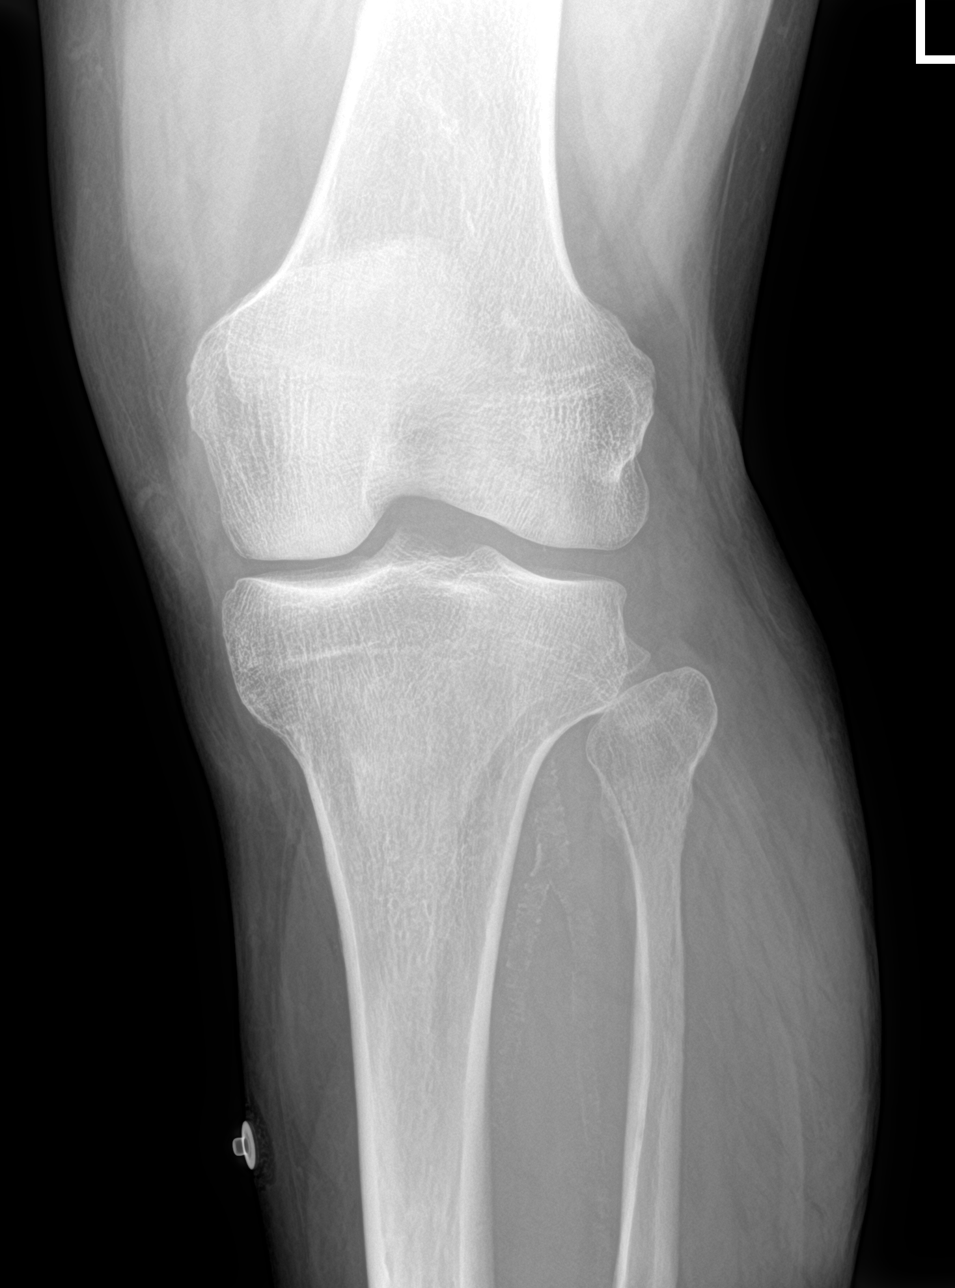

[4 of 4 positions shown; findings below may reference images not displayed]

FINDINGS: No evidence of fracture, dislocation, or joint effusion. No evidence
of arthropathy or other focal bone abnormality. Soft tissues are
unremarkable. Vascular calcification.
IMPRESSION: Negative.

## 2022-05-27 ENCOUNTER — Encounter: Payer: Self-pay | Admitting: Internal Medicine

## 2022-06-13 DIAGNOSIS — G4733 Obstructive sleep apnea (adult) (pediatric): Secondary | ICD-10-CM | POA: Diagnosis not present

## 2022-06-15 ENCOUNTER — Ambulatory Visit: Payer: Medicare HMO | Admitting: Podiatry

## 2022-06-15 ENCOUNTER — Encounter: Payer: Self-pay | Admitting: Podiatry

## 2022-06-15 DIAGNOSIS — M7752 Other enthesopathy of left foot: Secondary | ICD-10-CM

## 2022-06-15 DIAGNOSIS — M66869 Spontaneous rupture of other tendons, unspecified lower leg: Secondary | ICD-10-CM | POA: Diagnosis not present

## 2022-06-15 NOTE — Progress Notes (Signed)
Presents today for follow-up of subtalar joint capsulitis left he says the ankle is still giving me some problems as he refers to the sinus tarsi area.  He goes on to say that his foot slaps like a manage and we have noticed this since December or January.  Objective: Vital signs are stable alert oriented x 3.  There is no erythema edema/drainage or odor he does have limited on dorsiflexion and inversion of the foot at the level of the ankle it does feel like there is a still of the tibialis anterior tendon just above the ankle retinaculum.  He does have tenderness on end range of motion of the subtalar joint as well as palpation of the sinus tarsi.  Assessment: I am most concerned about the tibialis anterior tendon rupture.  Plan: At this point requesting MRI for traumatic rupture of the tibialis anterior tendon left ankle

## 2022-07-05 ENCOUNTER — Telehealth: Payer: Self-pay

## 2022-07-05 NOTE — Progress Notes (Signed)
Care Management & Coordination Services Pharmacy Team  Reason for Encounter: Appointment Reminder  Contacted patient to confirm telephone appointment with Al Corpus, PharmD on 07/08/2022 at 8:45.  Unsuccessful outreach. Left voicemail for patient to return call.  Star Rating Drugs:  Medication:  Last Fill: Day Supply Atorvastatin 20 mg 05/03/2022 90  Care Gaps: Annual wellness visit in last year? Yes 09/16/2021  If Diabetic: Last eye exam / retinopathy screening: Last diabetic foot exam:  Al Corpus, PharmD notified  Claudina Lick, [place] Clinical Pharmacy Assistant [phone]

## 2022-07-07 DIAGNOSIS — H353132 Nonexudative age-related macular degeneration, bilateral, intermediate dry stage: Secondary | ICD-10-CM | POA: Diagnosis not present

## 2022-07-08 ENCOUNTER — Encounter: Payer: Self-pay | Admitting: Podiatry

## 2022-07-08 ENCOUNTER — Ambulatory Visit: Payer: Medicare HMO | Admitting: Pharmacist

## 2022-07-08 DIAGNOSIS — N1832 Chronic kidney disease, stage 3b: Secondary | ICD-10-CM | POA: Diagnosis not present

## 2022-07-08 DIAGNOSIS — R3911 Hesitancy of micturition: Secondary | ICD-10-CM | POA: Diagnosis not present

## 2022-07-08 DIAGNOSIS — R809 Proteinuria, unspecified: Secondary | ICD-10-CM | POA: Diagnosis not present

## 2022-07-08 DIAGNOSIS — I129 Hypertensive chronic kidney disease with stage 1 through stage 4 chronic kidney disease, or unspecified chronic kidney disease: Secondary | ICD-10-CM | POA: Diagnosis not present

## 2022-07-08 DIAGNOSIS — N200 Calculus of kidney: Secondary | ICD-10-CM | POA: Diagnosis not present

## 2022-07-08 NOTE — Patient Instructions (Signed)
Visit Information  Phone number for Pharmacist: [phone]  Thank you for meeting with me to discuss your medications! Below is a summary of what we talked about during the visit:   Recommendations/Changes made from today's visit: -Discussed limiting meloxicam use as much as possible -advised to discuss Vitamin D with nephrologist  Follow up plan: -Health Concierge will call patient 6 months for BP update -Pharmacist follow up PRN   Al Corpus, PharmD, BCACP Clinical Pharmacist  Primary Care at [facility] [phone]

## 2022-07-08 NOTE — Progress Notes (Signed)
Care Management & Coordination Services Pharmacy Note  07/08/2022 Name:  Devin Howell MRN:  [id number] DOB:  07-16-1950  Summary: F/U visit -Reviewed medications with patient, updated med list according to what he is taking (removed amlodipine, furosemide, tamsulosin) -HTN: pt reports BP at home ~140/85, he takes hydralazine and metoprolol -CKD 3b: GFR 35 (02/2022), pt follows with nephrology and has been counseled to avoid NSAIDs; he was prescribed meloxicam in January for foot pain -Vitamin D deficiency: last Vit D level 17 (10/2021 - Nephrology), pt is not taking Vitamin D. He reports he is out in the sun all the time "so it's not an issue"; he reports he has appt with nephrology later today  Recommendations/Changes made from today's visit: -Discussed limiting meloxicam use as much as possible -advised to discuss Vitamin D with nephrologist  Follow up plan: -Health Concierge will call patient 6 months for BP update -Pharmacist follow up PRN    Subjective: Devin BatmanDavid S Sliney is an 72 y.o. year old male who is a primary patient of Doreene Nestlark, Katherine K, NP.  The care coordination team was consulted for assistance with disease management and care coordination needs.    Engaged with patient by telephone for follow up visit.  Recent office visits: 03/02/22 NP Mayra ReelKate Clark OV: annual - BP 162/86. Question BP med compliance.   Recent consult visits: 04/01/22 Dr Al [facility]Hyatt [facility](Podiatry): plantar fasciitis. Rx meloxicam, medrol dosepak.  02/26/22 Dr Malen GauzeFoster (Nephrology): f/u - avoid RAAS blockade in future. Avoid NSAIDS. Rec Vit D 2000 IU daily. Now off amlodipine to avoid clouding volume status assessment.   Hospital visits: None in previous 6 months   Objective:  Lab Results  Component Value Date   CREATININE 1.86 (H) 03/02/2022   BUN 26 (H) 03/02/2022   GFR 35.61 (L) 03/02/2022   GFRNONAA 44 (L) 07/19/2021   GFRAA 52 (L) 09/13/2017   NA 140 03/02/2022   K 4.8 03/02/2022   CALCIUM 9.5  03/02/2022   CO2 29 03/02/2022   GLUCOSE 87 03/02/2022    Lab Results  Component Value Date/Time   HGBA1C 4.9 03/02/2022 12:11 PM   HGBA1C 5.1 02/19/2020 11:37 AM   GFR 35.61 (L) 03/02/2022 12:11 PM   GFR 36.35 (L) 02/04/2021 11:31 AM    Last diabetic Eye exam: No results found for: "HMDIABEYEEXA"  Last diabetic Foot exam: No results found for: "HMDIABFOOTEX"   Lab Results  Component Value Date   CHOL 200 03/02/2022   HDL 73.90 03/02/2022   LDLCALC 99 03/02/2022   TRIG 137.0 03/02/2022   CHOLHDL 3 03/02/2022       Latest Ref Rng & Units 03/02/2022   12:11 PM 12/21/2020   10:53 AM 10/17/2020   11:56 AM  Hepatic Function  Total Protein 6.0 - 8.3 g/dL 6.9  7.0  7.1   Albumin 3.5 - 5.2 g/dL 4.3  3.8  4.1   AST 0 - 37 U/L 16  33  41   ALT 0 - 53 U/L 16  33  41   Alk Phosphatase 39 - 117 U/L 71  86  79   Total Bilirubin 0.2 - 1.2 mg/dL 0.8  0.5  1.6     Lab Results  Component Value Date/Time   TSH 2.55 03/02/2022 12:11 PM   TSH 3.44 02/04/2021 11:31 AM   FREET4 1.11 08/18/2020 10:11 AM       Latest Ref Rng & Units 07/19/2021    7:36 PM 06/11/2021   11:31 AM 02/04/2021  11:31 AM  CBC  WBC 4.0 - 10.5 K/uL 3.9  4.6  4.1   Hemoglobin 13.0 - 17.0 g/dL 93.7  34.2  87.6   Hematocrit 39.0 - 52.0 % 40.7  43.4  40.5   Platelets 150 - 400 K/uL 111  136.0  134.0     Lab Results  Component Value Date/Time   VITAMINB12 1,177 (H) 06/11/2021 11:31 AM   VITAMINB12 228 03/12/2021 11:17 AM    Clinical ASCVD: No  The ASCVD Risk score (Arnett DK, et al., 2019) failed to calculate for the following reasons:   The patient has a prior MI or stroke diagnosis        03/02/2022   11:34 AM 09/16/2021    8:40 AM 09/15/2020    9:07 AM  Depression screen PHQ 2/9  Decreased Interest 0 0 0  Down, Depressed, Hopeless 0 0 0  PHQ - 2 Score 0 0 0  Altered sleeping 0 0 0  Tired, decreased energy 0 0 0  Change in appetite 0 0 0  Feeling bad or failure about yourself  0 0 0  Trouble  concentrating 0 0 0  Moving slowly or fidgety/restless 0 0 0  Suicidal thoughts 0 0 0  PHQ-9 Score 0 0 0  Difficult doing work/chores Not difficult at all Not difficult at all Not difficult at all     Social History   Tobacco Use  Smoking Status Never   Passive exposure: Never  Smokeless Tobacco Never   BP Readings from Last 3 Encounters:  04/01/22 (!) 145/72  03/02/22 (!) 162/86  07/19/21 100/85   Pulse Readings from Last 3 Encounters:  04/01/22 71  03/02/22 73  07/19/21 61   Wt Readings from Last 3 Encounters:  03/02/22 236 lb (107 kg)  09/16/21 238 lb (108 kg)  03/09/21 238 lb (108 kg)   BMI Readings from Last 3 Encounters:  03/02/22 35.88 kg/m  09/16/21 36.19 kg/m  03/09/21 36.19 kg/m    Allergies  Allergen Reactions   Valsartan-Hydrochlorothiazide     Other Reaction(s): creatinine elevation    Medications Reviewed Today     Reviewed by Kathyrn Sheriff, [facility] (Pharmacist) on 07/08/22 at 0857  Med List Status: <None>   Medication Order Taking? Sig Documenting Provider Last Dose Status Informant  allopurinol (ZYLOPRIM) 300 MG tablet [id number] Yes TAKE 1 TABLET BY MOUTH EVERY DAY FOR GOUT PREVENTION Doreene Nest, NP Taking Active   Patient not taking:  Discontinued 07/08/22 0857 (Completed Course)   atorvastatin (LIPITOR) 20 MG tablet [id number] Yes TAKE 1 TABLET BY MOUTH EVERY DAY FOR CHOLESTEROL Doreene Nest, NP Taking Active   citalopram (CELEXA) 20 MG tablet [id number] Yes TAKE 1 TABLET (20 MG TOTAL) BY MOUTH DAILY. FOR ANXIETY AND DEPRESSION. Doreene Nest, NP Taking Active   Patient not taking:  Discontinued 07/08/22 0857 (Completed Course)   hydrALAZINE (APRESOLINE) 25 MG tablet [id number] Yes Take 25 mg by mouth 2 (two) times daily. [provider] Taking Active   levothyroxine (SYNTHROID) 50 MCG tablet [id number] Yes TAKE 1 TAB BY MOUTH EVERY MORNING ON EMPTY STOMACH WITH WATER ONLY NO FOOD/OTHER MEDS FOR 30 MINS Doreene Nest, NP Taking Active   Patient not taking:  Discontinued 07/08/22 0857 (Patient Preference)   meloxicam (MOBIC) 15 MG tablet [id number] Yes Take 1 tablet (15 mg total) by mouth daily. Abanda, Max T, [place] Taking Active   metoprolol succinate (TOPROL-XL) 50 MG 24 hr tablet [id number] Yes Take 50  mg by mouth daily. Estanislado Emms, MD Taking Active Spouse/Significant Other  testosterone cypionate (DEPOTESTOSTERONE CYPIONATE) 200 MG/ML injection [id number] Yes Inject 100 mg into the muscle once a week. Bjorn Pippin, MD Taking Active Spouse/Significant Other  traZODone (DESYREL) 50 MG tablet [id number] Yes TAKE 1 TABLET (50 MG TOTAL) BY MOUTH AT BEDTIME. FOR SLEEP. Doreene Nest, NP Taking Active   valACYclovir (VALTREX) 1000 MG tablet [id number] Yes 2 tablets Orally every 12 hrs for 2  doses as needed [provider] Taking Active             SDOH:  (Social Determinants of Health) assessments and interventions performed: No SDOH Interventions    Flowsheet Row Clinical Support from 09/16/2021 in [facility] Clinical Support from 09/15/2020 in [facility] [facility] Video Visit from 12/13/2019 in [facility] [facility] Clinical Support from 02/08/2019 in [facility] [place] [facility]  SDOH Interventions      Food Insecurity Interventions Intervention Not Indicated -- -- --  Housing Interventions Intervention Not Indicated -- -- --  Transportation Interventions Intervention Not Indicated -- -- --  Depression Interventions/Treatment  -- PHQ2-9 Score <4 Follow-up Not Indicated Medication, Counseling PHQ2-9 Score <4 Follow-up Not Indicated  Financial Strain Interventions Intervention Not Indicated -- -- --  Physical Activity Interventions Intervention Not Indicated -- -- --  Stress Interventions Intervention Not Indicated -- -- --  Social Connections Interventions Intervention Not Indicated  -- -- --       Medication Assistance: None required.  Patient affirms current coverage meets needs.  Medication Access: Within the past 30 days, how often has patient missed a dose of medication? 0 Is a pillbox or other method used to improve adherence? Yes  Factors that may affect medication adherence? no barriers identified Are meds synced by current pharmacy? No  Are meds delivered by current pharmacy? No  Does patient experience delays in picking up medications due to transportation concerns? No   [facility] Services Reviewed: Is patient disadvantaged to use [facility]?: Yes  Current Rx insurance plan: [employer] Name and location of Current pharmacy:  [phone] [place], [place] - [address] [place] [address] Phone: [phone] Fax: [phone]  [facility] services reviewed with patient today?: No  Patient requests to transfer care to [facility]?: No  Reason patient declined to change pharmacies: Disadvantaged due to insurance/mail order  Compliance/Adherence/Medication fill history: Care Gaps: None  Star-Rating Drugs: Atorvastatin - PDC 100%   Assessment/Plan  Hypertension (BP goal <140/90) -Controlled - per home readings -Current home readings: 140/85;  -Hx SVT (Zio patch 12/2020)  -Current treatment: Metoprolol succinate 50 mg daily - Appropriate, Effective, Safe, Accessible Hydralazine 25 mg BID - Appropriate, Effective, Safe, Accessible Furosemide 40 mg daily -Medications previously tried: amlodipine, valsartan, losartan -Educated on BP goals and benefits of medications for prevention of heart attack, stroke and kidney damage; Importance of home blood pressure monitoring; -Reviewed benefits of ARB for kidney disease -Counseled to monitor BP at home daily, document, and provide log at future appointments -Recommended to continue current medication  Hyperlipidemia: (LDL goal < 100) -Controlled - LDL 99  (02/2022) -Current treatment: Atorvastatin 20 mg daily - Appropriate, Effective, Safe, Accessible -Medications previously tried: n/a  -Educated on Cholesterol goals; Benefits of statin for ASCVD risk reduction; -Recommended to continue current medication  Depression/Anxiety (Goal: manage symptoms) -Controlled - per pt report, his anxiety resolved  within days of starting citalopram -Hx alcohol abuse. Has been referred to psych/[facility] outpatient program. -PHQ9: 0 (08/2020) - minimal depression -GAD7: 13 (11/2019) - moderate anxiety -Connected with PCP for mental health support -Current treatment: Citalopram 20 mg daily - Appropriate, Effective, Safe, Accessible Trazodone 50 mg HS - Appropriate, Effective, Safe, Accessible -Medications previously tried/failed: duloxetine -Educated on Benefits of medication for symptom control -Recommended to continue current medication  Chronic Kidney Disease Stage 3b  -All medications assessed for renal dosing and appropriateness in chronic kidney disease. -Follows with Nephrology (Dr Malen Gauze) -Counseled on avoiding NSAIDs - limit use of meloxicam as much as possible  Health Maintenance -Vaccine gaps: none -Hx alcohol abuse; ED visit 06/2021 for fall 2/2 alcohol intoxication (EtOH level 425); hx gastric ulcers, previously on PPI but not currently -BPH: On flomax -Gout: on allopurinol; no flare in years -HypoTH: on levothyroxine -On testosterone inj weekly   Al Corpus, PharmD, [place], CPP Clinical Pharmacist Practitioner [facility] [phone]

## 2022-07-09 LAB — LAB REPORT - SCANNED
Creatinine, POC: 65.4 mg/dL
EGFR: 33
Protein/Creatinine Ratio: 246

## 2022-07-10 ENCOUNTER — Ambulatory Visit
Admission: RE | Admit: 2022-07-10 | Discharge: 2022-07-10 | Disposition: A | Discharge status: Home / Self Care | Payer: Medicare HMO | Source: Ambulatory Visit | Attending: Podiatry | Admitting: Podiatry

## 2022-07-10 DIAGNOSIS — M66869 Spontaneous rupture of other tendons, unspecified lower leg: Secondary | ICD-10-CM

## 2022-07-10 DIAGNOSIS — R600 Localized edema: Secondary | ICD-10-CM | POA: Diagnosis not present

## 2022-07-10 DIAGNOSIS — M67472 Ganglion, left ankle and foot: Secondary | ICD-10-CM | POA: Diagnosis not present

## 2022-07-14 DIAGNOSIS — G4733 Obstructive sleep apnea (adult) (pediatric): Secondary | ICD-10-CM | POA: Diagnosis not present

## 2022-07-15 ENCOUNTER — Telehealth: Payer: Self-pay | Admitting: *Deleted

## 2022-07-15 NOTE — Telephone Encounter (Signed)
Faxed request for over read to [facility] fax to [facility] for the disc to be sent to them as well.

## 2022-07-19 NOTE — Telephone Encounter (Signed)
error 

## 2022-07-27 ENCOUNTER — Ambulatory Visit: Payer: Medicare HMO | Admitting: Podiatry

## 2022-07-27 ENCOUNTER — Encounter: Payer: Self-pay | Admitting: Podiatry

## 2022-07-27 DIAGNOSIS — S86219A Strain of muscle(s) and tendon(s) of anterior muscle group at lower leg level, unspecified leg, initial encounter: Secondary | ICD-10-CM | POA: Diagnosis not present

## 2022-07-27 NOTE — Progress Notes (Signed)
He presents with his wife today for a surgical consult and follow-up of his MRI.  MRI was sent out for an overread.  Denies any changes states that the area really does not hurt as he refers to the left ankle.  Objective: Vital signs stable oriented x 3.  His right at the inferior retinaculum of the ankle.  Pulses are palpable.  Radiographs were reviewed MRI reviewed does demonstrate an 8.2 cm diastases of the tibialis anterior with a stump approximately oriented at the distal retinacula.  Assessment is rupture of the tibialis anterior tendon left.  Plan: We discussed etiology pathology and surgical therapies at this point were going to try to repair this though it may not be futile he understands this and is amenable to it understands the [place] but most likely have to use a graft and tacked that into the foot.  He understands and is amenable to it.  Did discuss the use of the cast as well and grafting material understands and is amenable to it discussed possible postop complications which may include but are not limited to postop pain bleeding swell infection recurrence need further surgery overcorrection under correction loss of digit loss limb loss of life possibility that we may need to abort this and just use of brace.

## 2022-08-01 DIAGNOSIS — R300 Dysuria: Secondary | ICD-10-CM | POA: Diagnosis not present

## 2022-08-02 ENCOUNTER — Other Ambulatory Visit: Payer: Self-pay | Admitting: Podiatry

## 2022-08-02 NOTE — Telephone Encounter (Signed)
Approved refill of Meloxicam 15 mg.

## 2022-08-10 ENCOUNTER — Encounter: Payer: Self-pay | Admitting: Podiatry

## 2022-08-12 ENCOUNTER — Encounter: Payer: Self-pay | Admitting: Primary Care

## 2022-08-13 DIAGNOSIS — G4733 Obstructive sleep apnea (adult) (pediatric): Secondary | ICD-10-CM | POA: Diagnosis not present

## 2022-08-17 ENCOUNTER — Telehealth: Payer: Self-pay | Admitting: Urology

## 2022-08-17 NOTE — Telephone Encounter (Signed)
DOS - 09/17/22  REPAIR TIBIALIS ANTERIOR TENDON LEFT --- 16109  [employer] EFFECTIVE DATE - 03/29/20  SPOKE WITH SARA WITH [employer] AND SHE STATED THAT FOR CPT CODE 60454 NO PRIOR AUTH IS REQUIRED.  CALL REF # [id number]

## 2022-09-06 DIAGNOSIS — E291 Testicular hypofunction: Secondary | ICD-10-CM | POA: Diagnosis not present

## 2022-09-09 ENCOUNTER — Ambulatory Visit (INDEPENDENT_AMBULATORY_CARE_PROVIDER_SITE_OTHER): Payer: Medicare HMO

## 2022-09-09 VITALS — Ht 68.0 in | Wt 225.0 lb

## 2022-09-09 DIAGNOSIS — Z0000 Encounter for general adult medical examination without abnormal findings: Secondary | ICD-10-CM | POA: Diagnosis not present

## 2022-09-09 NOTE — Progress Notes (Signed)
I connected with  Valeria Batman on 09/09/22 by a audio enabled telemedicine application and verified that I am speaking with the correct person using two identifiers.  Patient Location: Home  Provider Location: Home Office  I discussed the limitations of evaluation and management by telemedicine. The patient expressed understanding and agreed to proceed.  Subjective:   Devin Howell is a 72 y.o. male who presents for Medicare Annual/Subsequent preventive examination.  Review of Systems      Cardiac Risk Factors include: advanced age (>39men, >64 women);hypertension;obesity (BMI >30kg/m2);male gender     Objective:    Today's Vitals   09/09/22 0840  Weight: 225 lb (102.1 kg)  Height: 5\' 8"  (1.727 m)   Body mass index is 34.21 kg/m.     09/09/2022    8:49 AM 09/16/2021    8:42 AM 09/15/2020    9:05 AM 08/15/2020    8:05 PM 08/15/2020    1:51 PM 02/08/2019    2:22 PM 12/16/2016   12:44 PM  Advanced Directives  Does Patient Have a Medical Advance Directive? Yes No Yes  No Yes Yes  Type of Estate agent of [place];Living will  Healthcare Power of [place];Living will   Healthcare Power of [place];Living will Healthcare Power of [place];Living will  Copy of Healthcare Power of Attorney in Chart? No - copy requested  No - copy requested   No - copy requested No - copy requested  Would patient like information on creating a medical advance directive?  No - Patient declined  No - Patient declined       Current Medications (verified) Outpatient Encounter Medications as of 09/09/2022  Medication Sig   allopurinol (ZYLOPRIM) 300 MG tablet TAKE 1 TABLET BY MOUTH EVERY DAY FOR GOUT PREVENTION   atorvastatin (LIPITOR) 20 MG tablet TAKE 1 TABLET BY MOUTH EVERY DAY FOR CHOLESTEROL   citalopram (CELEXA) 20 MG tablet TAKE 1 TABLET (20 MG TOTAL) BY MOUTH DAILY. FOR ANXIETY AND DEPRESSION.   hydrALAZINE (APRESOLINE) 25 MG tablet Take 25 mg by mouth 2 (two) times  daily.   levothyroxine (SYNTHROID) 50 MCG tablet TAKE 1 TAB BY MOUTH EVERY MORNING ON EMPTY STOMACH WITH WATER ONLY NO FOOD/OTHER MEDS FOR 30 MINS   metoprolol succinate (TOPROL-XL) 50 MG 24 hr tablet Take 50 mg by mouth daily.   testosterone cypionate (DEPOTESTOSTERONE CYPIONATE) 200 MG/ML injection Inject 100 mg into the muscle once a week.   valACYclovir (VALTREX) 1000 MG tablet 2 tablets Orally every 12 hrs for 2  doses as needed   meloxicam (MOBIC) 15 MG tablet TAKE 1 TABLET (15 MG TOTAL) BY MOUTH DAILY. (Patient not taking: Reported on 09/09/2022)   traZODone (DESYREL) 50 MG tablet TAKE 1 TABLET (50 MG TOTAL) BY MOUTH AT BEDTIME. FOR SLEEP. (Patient not taking: Reported on 09/09/2022)   No facility-administered encounter medications on file as of 09/09/2022.    Allergies (verified) Valsartan-hydrochlorothiazide   History: Past Medical History:  Diagnosis Date   AKI (acute kidney injury) (HCC) 08/15/2020   Cataract    Chronic gout    CKD (chronic kidney disease)    pt. denies   Erectile dysfunction    GERD (gastroesophageal reflux disease)    Herpes simplex    Hx of adenomatous polyp of colon 04/12/2017   Hyperlipidemia    pt. denies   Hypertension    pt. denies   OSA on CPAP    Sleep apnea    cpap   Subarachnoid hematoma (HCC)  Subdural hematoma (HCC)    Testosterone deficiency    Past Surgical History:  Procedure Laterality Date   COLONOSCOPY     ESOPHAGOGASTRODUODENOSCOPY     TONSILLECTOMY AND ADENOIDECTOMY  1960   Family History  Problem Relation Age of Onset   Rectal cancer Mother    Heart Problems Father    Colon polyps Neg Hx    Esophageal cancer Neg Hx    Colon cancer Neg Hx    Stomach cancer Neg Hx    Social History   Socioeconomic History   Marital status: Married    Spouse name: Not on file   Number of children: Not on file   Years of education: Not on file   Highest education level: Bachelor's degree (e.g., BA, AB, BS)  Occupational History    Not on file  Tobacco Use   Smoking status: Never    Passive exposure: Never   Smokeless tobacco: Never  Vaping Use   Vaping Use: Never used  Substance and Sexual Activity   Alcohol use: Yes    Alcohol/week: 2.0 standard drinks of alcohol    Types: 2 Glasses of wine per week   Drug use: No   Sexual activity: Not Currently  Other Topics Concern   Not on file  Social History Narrative   Lives with wife at home    Right handed    Some caffeine intake    Social Determinants of Health   Financial Resource Strain: Low Risk  (09/09/2022)   Overall Financial Resource Strain (CARDIA)    Difficulty of Paying Living Expenses: Not hard at all  Food Insecurity: No Food Insecurity (09/09/2022)   Hunger Vital Sign    Worried About Running Out of Food in the Last Year: Never true    Ran Out of Food in the Last Year: Never true  Transportation Needs: No Transportation Needs (09/09/2022)   PRAPARE - Administrator, Civil Service (Medical): No    Lack of Transportation (Non-Medical): No  Physical Activity: Sufficiently Active (09/09/2022)   Exercise Vital Sign    Days of Exercise per Week: 5 days    Minutes of Exercise per Session: 50 min  Stress: No Stress Concern Present (09/09/2022)   [employer] of Occupational Health - Occupational Stress Questionnaire    Feeling of Stress : Not at all  Social Connections: Moderately Isolated (09/09/2022)   Social Connection and Isolation Panel [NHANES]    Frequency of Communication with Friends and Family: More than three times a week    Frequency of Social Gatherings with Friends and Family: More than three times a week    Attends Religious Services: Never    Database administrator or Organizations: No    Attends Engineer, structural: Never    Marital Status: Married    Tobacco Counseling Counseling given: Not Answered   Clinical Intake:  Pre-visit preparation completed: Yes  Pain : No/denies pain     Nutritional  Risks: None Diabetes: No  How often do you need to have someone help you when you read instructions, pamphlets, or other written materials from your doctor or pharmacy?: 1 - Never  Diabetic? no  Interpreter Needed?: No  Information entered by :: C.Valincia Touch LPN   Activities of Daily Living    09/09/2022    8:49 AM 09/08/2022   10:47 AM  In your present state of health, do you have any difficulty performing the following activities:  Hearing? 0 1  Vision? 0  0  Difficulty concentrating or making decisions? 0 0  Walking or climbing stairs? 0 0  Dressing or bathing? 0 0  Doing errands, shopping? 0 0  Preparing Food and eating ? N N  Using the Toilet? N N  In the past six months, have you accidently leaked urine? N N  Do you have problems with loss of bowel control? N N  Managing your Medications? N N  Managing your Finances? N N  Housekeeping or managing your Housekeeping? N N    Patient Care Team: Doreene Nest, NP as PCP - General (Internal Medicine) Quintella Reichert, MD as PCP - Cardiology (Cardiology) Bjorn Pippin, MD as Attending Physician (Urology) Kathyrn Sheriff, [facility] as Pharmacist (Pharmacist)  Indicate any recent Medical Services you may have received from other than [facility] providers in the past year (date may be approximate).     Assessment:   This is a routine wellness examination for Yahir.  Hearing/Vision screen Hearing Screening - Comments:: aids Vision Screening - Comments:: No vision impairment  Dietary issues and exercise activities discussed: Current Exercise Habits: Home exercise routine, Time (Minutes): 50, Frequency (Times/Week): 5, Weekly Exercise (Minutes/Week): 250, Intensity: Mild, Exercise limited by: None identified   Goals Addressed             This Visit's Progress    Patient Stated       Stay active       Depression Screen    09/09/2022    8:48 AM 03/02/2022   11:34 AM 09/16/2021    8:40 AM 09/15/2020    9:07 AM  02/19/2020   11:04 AM 12/13/2019   12:11 PM 12/13/2019   11:48 AM  PHQ 2/9 Scores  PHQ - 2 Score 0 0 0 0 0 6 6  PHQ- 9 Score  0 0 0 0 23 20    Fall Risk    09/09/2022    8:42 AM 09/08/2022   10:47 AM 03/02/2022   11:34 AM 09/16/2021    8:43 AM 09/15/2020    9:05 AM  Fall Risk   Falls in the past year? 0 0 0 0 1  Number falls in past yr: 0 0 0 0 1  Injury with Fall? 0 0 0 0 0  Risk for fall due to : No Fall Risks   No Fall Risks Medication side effect  Follow up Falls prevention discussed;Falls evaluation completed   Falls evaluation completed Falls evaluation completed;Falls prevention discussed    FALL RISK PREVENTION PERTAINING TO THE HOME:  Any stairs in or around the home? Yes  If so, are there any without handrails? No  Home free of loose throw rugs in walkways, pet beds, electrical cords, etc? Yes  Adequate lighting in your home to reduce risk of falls? Yes   ASSISTIVE DEVICES UTILIZED TO PREVENT FALLS:  Life alert? No  Use of a cane, walker or w/c? No  Grab bars in the bathroom? Yes  Shower chair or bench in shower? Yes  Elevated toilet seat or a handicapped toilet? No    Cognitive Function:    09/15/2020    9:11 AM 02/08/2019    2:24 PM 12/16/2016   12:44 PM  MMSE - Mini Mental State Exam  Orientation to time 5 5 5   Orientation to Place 5 5 5   Registration 3 3 3   Attention/ Calculation 5 5 0  Recall 3 3 3   Language- name 2 objects   0  Language- repeat 1 1  1  Language- follow 3 step command   3  Language- read & follow direction   0  Write a sentence   0  Copy design   0  Total score   20        09/09/2022    8:50 AM 09/16/2021    8:44 AM  6CIT Screen  What Year? 0 points 0 points  What month? 0 points 0 points  What time? 0 points 0 points  Count back from 20 0 points 0 points  Months in reverse 0 points 0 points  Repeat phrase 2 points 0 points  Total Score 2 points 0 points    Immunizations Immunization History  Administered Date(s)  Administered   Covid-19, Mrna,Vaccine(Spikevax)52yrs and older 06/09/2022   Fluad Quad(high Dose 65+) 12/17/2020   Influenza, High Dose Seasonal PF 12/08/2016   Influenza,inj,Quad PF,6+ Mos 11/02/2018   Influenza-Unspecified 11/27/2017, 12/25/2019, 12/27/2021   PFIZER(Purple Top)SARS-COV-2 Vaccination 04/23/2019, 05/14/2019, 12/25/2019, 05/26/2020   PNEUMOCOCCAL CONJUGATE-20 12/10/2020   Pfizer Covid-19 Vaccine Bivalent Booster 64yrs & up 12/11/2020   Pneumococcal Conjugate-13 02/12/2015   Pneumococcal Polysaccharide-23 12/16/2016   Td 12/16/2016   Zoster Recombinat (Shingrix) 10/14/2016, 03/13/2017    TDAP status: Up to date  Flu Vaccine status: Up to date  Pneumococcal vaccine status: Up to date  Covid-19 vaccine status: Information provided on how to obtain vaccines.   Qualifies for Shingles Vaccine? Yes   Zostavax completed No   Shingrix Completed?: Yes  Screening Tests Health Maintenance  Topic Date Due   INFLUENZA VACCINE  10/28/2022   Medicare Annual Wellness (AWV)  09/09/2023   Colonoscopy  04/08/2024   DTaP/Tdap/Td (2 - Tdap) 12/17/2026   Pneumonia Vaccine 26+ Years old  Completed   Hepatitis C Screening  Completed   Zoster Vaccines- Shingrix  Completed   HPV VACCINES  Aged Out   COVID-19 Vaccine  Discontinued    Health Maintenance  There are no preventive care reminders to display for this patient.   Colorectal cancer screening: Type of screening: Colonoscopy. Completed 04/08/17. Repeat every 7 years  Lung Cancer Screening: (Low Dose CT Chest recommended if Age 24-80 years, 30 pack-year currently smoking OR have quit w/in 15years.) does not qualify.   Lung Cancer Screening Referral: no  Additional Screening:  Hepatitis C Screening: does qualify; Completed 08/15/20  Vision Screening: Recommended annual ophthalmology exams for early detection of glaucoma and other disorders of the eye. Is the patient up to date with their annual eye exam?  Yes  Who is  the provider or what is the name of the office in which the patient attends annual eye exams? [facility] If pt is not established with a provider, would they like to be referred to a provider to establish care? Yes .   Dental Screening: Recommended annual dental exams for proper oral hygiene  Community Resource Referral / Chronic Care Management: CRR required this visit?  No   CCM required this visit?  No      Plan:     I have personally reviewed and noted the following in the patient's chart:   Medical and social history Use of alcohol, tobacco or illicit drugs  Current medications and supplements including opioid prescriptions. Patient is not currently taking opioid prescriptions. Functional ability and status Nutritional status Physical activity Advanced directives List of other physicians Hospitalizations, surgeries, and ER visits in previous 12 months Vitals Screenings to include cognitive, depression, and falls Referrals and appointments  In addition, I have reviewed  and discussed with patient certain preventive protocols, quality metrics, and best practice recommendations. A written personalized care plan for preventive services as well as general preventive health recommendations were provided to patient.     Maryan Puls, LPN   1/61/0960   Nurse Notes: none

## 2022-09-09 NOTE — Patient Instructions (Signed)
Mr. Devin Howell , Thank you for taking time to come for your Medicare Wellness Visit. I appreciate your ongoing commitment to your health goals. Please review the following plan we discussed and let me know if I can assist you in the future.   These are the goals we discussed:  Goals      Blood Pressure < 130/80     DIET - EAT MORE FRUITS AND VEGETABLES     Increase physical activity     Starting 12/16/2016, I will continue to walk at least 5 miles 4 days per week.      Patient Stated     02/08/2019, I will maintain and continue medications as prescribed.      Patient Stated     09/15/2020, I will continue to walk 4 days a week for several miles.      Patient Stated     Stay active     Track and Manage My Blood Pressure-Hypertension     Timeframe:  Long-Range Goal Priority:  Medium Start Date:      07/07/21                       Expected End Date:      07/08/22                 Follow Up Date April 2024   - check blood pressure daily - write blood pressure results in a log or diary    Why is this important?   You won't feel high blood pressure, but it can still hurt your blood vessels.  High blood pressure can cause heart or kidney problems. It can also cause a stroke.  Making lifestyle changes like losing a little weight or eating less salt will help.  Checking your blood pressure at home and at different times of the day can help to control blood pressure.  If the doctor prescribes medicine remember to take it the way the doctor ordered.  Call the office if you cannot afford the medicine or if there are questions about it.     Notes:         This is a list of the screening recommended for you and due dates:  Health Maintenance  Topic Date Due   Flu Shot  10/28/2022   Medicare Annual Wellness Visit  09/09/2023   Colon Cancer Screening  04/08/2024   DTaP/Tdap/Td vaccine (2 - Tdap) 12/17/2026   Pneumonia Vaccine  Completed   Hepatitis C Screening  Completed   Zoster  (Shingles) Vaccine  Completed   HPV Vaccine  Aged Out   COVID-19 Vaccine  Discontinued    Advanced directives: Please bring a copy of your health care power of attorney and living will to the office to be added to your chart at your convenience.   Conditions/risks identified: Aim for 30 minutes of exercise or brisk walking, 6-8 glasses of water, and 5 servings of fruits and vegetables each day.   Next appointment: Follow up in one year for your annual wellness visit. 09/13/23 @ 8:15 televisit  Preventive Care 65 Years and Older, Male  Preventive care refers to lifestyle choices and visits with your health care provider that can promote health and wellness. What does preventive care include? A yearly physical exam. This is also called an annual well check. Dental exams once or twice a year. Routine eye exams. Ask your health care provider how often you should have your eyes checked. Personal  lifestyle choices, including: Daily care of your teeth and gums. Regular physical activity. Eating a healthy diet. Avoiding tobacco and drug use. Limiting alcohol use. Practicing safe sex. Taking low doses of aspirin every day. Taking vitamin and mineral supplements as recommended by your health care provider. What happens during an annual well check? The services and screenings done by your health care provider during your annual well check will depend on your age, overall health, lifestyle risk factors, and family history of disease. Counseling  Your health care provider may ask you questions about your: Alcohol use. Tobacco use. Drug use. Emotional well-being. Home and relationship well-being. Sexual activity. Eating habits. History of falls. Memory and ability to understand (cognition). Work and work Astronomer. Screening  You may have the following tests or measurements: Height, weight, and BMI. Blood pressure. Lipid and cholesterol levels. These may be checked every 5 years, or  more frequently if you are over 86 years old. Skin check. Lung cancer screening. You may have this screening every year starting at age 54 if you have a 30-pack-year history of smoking and currently smoke or have quit within the past 15 years. Fecal occult blood test (FOBT) of the stool. You may have this test every year starting at age 1. Flexible sigmoidoscopy or colonoscopy. You may have a sigmoidoscopy every 5 years or a colonoscopy every 10 years starting at age 31. Prostate cancer screening. Recommendations will vary depending on your family history and other risks. Hepatitis C blood test. Hepatitis B blood test. Sexually transmitted disease (STD) testing. Diabetes screening. This is done by checking your blood sugar (glucose) after you have not eaten for a while (fasting). You may have this done every 1-3 years. Abdominal aortic aneurysm (AAA) screening. You may need this if you are a current or former smoker. Osteoporosis. You may be screened starting at age 5 if you are at high risk. Talk with your health care provider about your test results, treatment options, and if necessary, the need for more tests. Vaccines  Your health care provider may recommend certain vaccines, such as: Influenza vaccine. This is recommended every year. Tetanus, diphtheria, and acellular pertussis (Tdap, Td) vaccine. You may need a Td booster every 10 years. Zoster vaccine. You may need this after age 22. Pneumococcal 13-valent conjugate (PCV13) vaccine. One dose is recommended after age 39. Pneumococcal polysaccharide (PPSV23) vaccine. One dose is recommended after age 71. Talk to your health care provider about which screenings and vaccines you need and how often you need them. This information is not intended to replace advice given to you by your health care provider. Make sure you discuss any questions you have with your health care provider. Document Released: 04/11/2015 Document Revised: 12/03/2015  Document Reviewed: 01/14/2015 Elsevier Interactive Patient Education  2017 [employer].  Fall Prevention in the Home Falls can cause injuries. They can happen to people of all ages. There are many things you can do to make your home safe and to help prevent falls. What can I do on the outside of my home? Regularly fix the edges of walkways and driveways and fix any cracks. Remove anything that might make you trip as you walk through a door, such as a raised step or threshold. Trim any bushes or trees on the path to your home. Use bright outdoor lighting. Clear any walking paths of anything that might make someone trip, such as rocks or tools. Regularly check to see if handrails are loose or broken. Make sure  that both sides of any steps have handrails. Any raised decks and porches should have guardrails on the edges. Have any leaves, snow, or ice cleared regularly. Use sand or salt on walking paths during winter. Clean up any spills in your garage right away. This includes oil or grease spills. What can I do in the bathroom? Use night lights. Install grab bars by the toilet and in the tub and shower. Do not use towel bars as grab bars. Use non-skid mats or decals in the tub or shower. If you need to sit down in the shower, use a plastic, non-slip stool. Keep the floor dry. Clean up any water that spills on the floor as soon as it happens. Remove soap buildup in the tub or shower regularly. Attach bath mats securely with double-sided non-slip rug tape. Do not have throw rugs and other things on the floor that can make you trip. What can I do in the bedroom? Use night lights. Make sure that you have a light by your bed that is easy to reach. Do not use any sheets or blankets that are too big for your bed. They should not hang down onto the floor. Have a firm chair that has side arms. You can use this for support while you get dressed. Do not have throw rugs and other things on the floor  that can make you trip. What can I do in the kitchen? Clean up any spills right away. Avoid walking on wet floors. Keep items that you use a lot in easy-to-reach places. If you need to reach something above you, use a strong step stool that has a grab bar. Keep electrical cords out of the way. Do not use floor polish or wax that makes floors slippery. If you must use wax, use non-skid floor wax. Do not have throw rugs and other things on the floor that can make you trip. What can I do with my stairs? Do not leave any items on the stairs. Make sure that there are handrails on both sides of the stairs and use them. Fix handrails that are broken or loose. Make sure that handrails are as long as the stairways. Check any carpeting to make sure that it is firmly attached to the stairs. Fix any carpet that is loose or worn. Avoid having throw rugs at the top or bottom of the stairs. If you do have throw rugs, attach them to the floor with carpet tape. Make sure that you have a light switch at the top of the stairs and the bottom of the stairs. If you do not have them, ask someone to add them for you. What else can I do to help prevent falls? Wear shoes that: Do not have high heels. Have rubber bottoms. Are comfortable and fit you well. Are closed at the toe. Do not wear sandals. If you use a stepladder: Make sure that it is fully opened. Do not climb a closed stepladder. Make sure that both sides of the stepladder are locked into place. Ask someone to hold it for you, if possible. Clearly mark and make sure that you can see: Any grab bars or handrails. First and last steps. Where the edge of each step is. Use tools that help you move around (mobility aids) if they are needed. These include: Canes. Walkers. Scooters. Crutches. Turn on the lights when you go into a dark area. Replace any light bulbs as soon as they burn out. Set up your furniture so  you have a clear path. Avoid moving your  furniture around. If any of your floors are uneven, fix them. If there are any pets around you, be aware of where they are. Review your medicines with your doctor. Some medicines can make you feel dizzy. This can increase your chance of falling. Ask your doctor what other things that you can do to help prevent falls. This information is not intended to replace advice given to you by your health care provider. Make sure you discuss any questions you have with your health care provider. Document Released: 01/09/2009 Document Revised: 08/21/2015 Document Reviewed: 04/19/2014 Elsevier Interactive Patient Education  2017 [employer].

## 2022-09-13 DIAGNOSIS — N401 Enlarged prostate with lower urinary tract symptoms: Secondary | ICD-10-CM | POA: Diagnosis not present

## 2022-09-13 DIAGNOSIS — G4733 Obstructive sleep apnea (adult) (pediatric): Secondary | ICD-10-CM | POA: Diagnosis not present

## 2022-09-13 DIAGNOSIS — E291 Testicular hypofunction: Secondary | ICD-10-CM | POA: Diagnosis not present

## 2022-09-13 DIAGNOSIS — R351 Nocturia: Secondary | ICD-10-CM | POA: Diagnosis not present

## 2022-09-23 ENCOUNTER — Encounter: Payer: Medicare HMO | Admitting: Podiatry

## 2022-09-29 ENCOUNTER — Encounter: Payer: Medicare HMO | Admitting: Podiatry

## 2022-10-13 ENCOUNTER — Other Ambulatory Visit: Payer: Self-pay | Admitting: Podiatry

## 2022-10-13 DIAGNOSIS — G4733 Obstructive sleep apnea (adult) (pediatric): Secondary | ICD-10-CM | POA: Diagnosis not present

## 2022-10-13 MED ORDER — CEPHALEXIN 500 MG PO CAPS: 500.0000 mg | ORAL_CAPSULE | Freq: Three times a day (TID) | ORAL | 0 refills | Status: DC

## 2022-10-13 MED ORDER — ONDANSETRON HCL 4 MG PO TABS: 4.0000 mg | ORAL_TABLET | Freq: Three times a day (TID) | ORAL | 0 refills | Status: DC | PRN

## 2022-10-13 MED ORDER — OXYCODONE-ACETAMINOPHEN 10-325 MG PO TABS: 1.0000 | ORAL_TABLET | Freq: Three times a day (TID) | ORAL | 0 refills | Status: AC | PRN

## 2022-10-14 ENCOUNTER — Encounter: Payer: Medicare HMO | Admitting: Podiatry

## 2022-10-15 DIAGNOSIS — X58XXXA Exposure to other specified factors, initial encounter: Secondary | ICD-10-CM | POA: Diagnosis not present

## 2022-10-15 DIAGNOSIS — Y929 Unspecified place or not applicable: Secondary | ICD-10-CM | POA: Diagnosis not present

## 2022-10-15 DIAGNOSIS — S86212D Strain of muscle(s) and tendon(s) of anterior muscle group at lower leg level, left leg, subsequent encounter: Secondary | ICD-10-CM | POA: Diagnosis not present

## 2022-10-15 DIAGNOSIS — G8918 Other acute postprocedural pain: Secondary | ICD-10-CM | POA: Diagnosis not present

## 2022-10-15 DIAGNOSIS — S86212A Strain of muscle(s) and tendon(s) of anterior muscle group at lower leg level, left leg, initial encounter: Secondary | ICD-10-CM | POA: Diagnosis not present

## 2022-10-18 ENCOUNTER — Telehealth: Payer: Self-pay

## 2022-10-21 ENCOUNTER — Ambulatory Visit (INDEPENDENT_AMBULATORY_CARE_PROVIDER_SITE_OTHER): Payer: Medicare HMO | Admitting: Podiatry

## 2022-10-21 DIAGNOSIS — Z9889 Other specified postprocedural states: Secondary | ICD-10-CM

## 2022-10-21 NOTE — Progress Notes (Signed)
Chief Complaint  Patient presents with   Routine Post Op    POV #1 DOS 10/15/2022 LT FOOT PRIMARY REPAIR TIBIALIS ANTERIOR TENDON W/GRAFT W/CAST APPLICATION,patient denies any N/V/F/C/SOB, rate of pain 3 out of 10, cast is not rubbing, TX: knee scooter, pain meds    HPI: 72 y.o. male presents today for first postop visit today following left tibialis anterior tendon repair with cast application.  Date of surgery was 10/15/2022.  Patient denies fever, chills, night sweats, nausea or vomiting.  Patient notes there are no areas of irritation from the fiberglass cast that he is wearing.  He does note that when taking the pain medication he does get a sensation of itching all over his body.  He does not have any hives.  He has been was wondering if this was due to the medication  Past Medical History:  Diagnosis Date   AKI (acute kidney injury) (HCC) 08/15/2020   Cataract    Chronic gout    CKD (chronic kidney disease)    pt. denies   Erectile dysfunction    GERD (gastroesophageal reflux disease)    Herpes simplex    Hx of adenomatous polyp of colon 04/12/2017   Hyperlipidemia    pt. denies   Hypertension    pt. denies   OSA on CPAP    Sleep apnea    cpap   Subarachnoid hematoma (HCC)    Subdural hematoma (HCC)    Testosterone deficiency    Past Surgical History:  Procedure Laterality Date   COLONOSCOPY     ESOPHAGOGASTRODUODENOSCOPY     TONSILLECTOMY AND ADENOIDECTOMY  1960   Allergies  Allergen Reactions   Valsartan-Hydrochlorothiazide     Other Reaction(s): creatinine elevation    Physical Exam: There were no vitals filed for this visit.  On exam there is capillary refill to the toes that are exposed from the cast.  No areas of irritation were seen on the skin around the areas of the cast.  The cast is in good condition.  Assessment/Plan of Care: 1. Status post surgery     Discussed clinical findings with patient today.  I was informed by the medical assistant that Dr.  Al Corpus had relayed the message that if the patient cast was in good condition and there were no areas of irritation cutting into the skin from the cast to leave this on until he is seen next week.  Next week visit will involve removal of cast and possible removal of sutures if there are any that need to be aseptically removed.  Discussed how narcotics can cause a histamine release and results and itching.  As long as there are no hives this is a typical side effect.  Did offer to prescribe a different narcotic for him but he was informed he may also react the same way to another one.  He can take Benadryl as needed to help control any itching.  Also discussed taking Colace if he is having any issues with constipation from taking the opioid medication  Continue with elevation of the foot above the level of the heart and use the ice pack as instructed.  Follow-up 1 week   Evanell Redlich DBurna Mortimer, DPM, FACFAS [facility]     [address]  [place], [place] [address]                Office [phone]  Fax [phone]

## 2022-10-28 ENCOUNTER — Encounter: Payer: Self-pay | Admitting: Podiatry

## 2022-10-28 ENCOUNTER — Ambulatory Visit (INDEPENDENT_AMBULATORY_CARE_PROVIDER_SITE_OTHER): Payer: Medicare HMO | Admitting: Podiatry

## 2022-10-28 ENCOUNTER — Encounter: Payer: Medicare HMO | Admitting: Podiatry

## 2022-10-28 DIAGNOSIS — Z9889 Other specified postprocedural states: Secondary | ICD-10-CM

## 2022-10-28 DIAGNOSIS — S86202A Unspecified injury of muscle(s) and tendon(s) of anterior muscle group at lower leg level, left leg, initial encounter: Secondary | ICD-10-CM | POA: Diagnosis not present

## 2022-10-28 DIAGNOSIS — S86219A Strain of muscle(s) and tendon(s) of anterior muscle group at lower leg level, unspecified leg, initial encounter: Secondary | ICD-10-CM

## 2022-10-28 NOTE — Progress Notes (Signed)
He presents today with his wife using his knee scooter denies fever chills nausea vomit muscle aches and pains for second postop visit date of surgery is 10/15/2022 repair of anterior tibial tendon with a graft a gastroc recession and cast application.  He denies any shortness of breath.  States that he occasionally takes his aspirin.  Objective: Vital signs are stable he is alert and oriented x 3.  Cast is intact once removed demonstrates dry sterile dressing intact once removed demonstrates staples intact.  No erythema edema cellulitis drainage or odor he has good range of motion of his ankle joint.  Assessment: Well-healing surgical foot and ankle.  Plan: Redressed today dressed a compressive dressing and a shorter lighter weight cast he will continue nonweightbearing with a knee scooter encouraged range of motion activity of his toes and then also recommended that he do take his aspirin regularly.  I will see him in 2 to 3 weeks for recast or boot.

## 2022-11-03 DIAGNOSIS — N200 Calculus of kidney: Secondary | ICD-10-CM | POA: Diagnosis not present

## 2022-11-03 DIAGNOSIS — I129 Hypertensive chronic kidney disease with stage 1 through stage 4 chronic kidney disease, or unspecified chronic kidney disease: Secondary | ICD-10-CM | POA: Diagnosis not present

## 2022-11-03 DIAGNOSIS — R3911 Hesitancy of micturition: Secondary | ICD-10-CM | POA: Diagnosis not present

## 2022-11-03 DIAGNOSIS — R809 Proteinuria, unspecified: Secondary | ICD-10-CM | POA: Diagnosis not present

## 2022-11-03 DIAGNOSIS — N1832 Chronic kidney disease, stage 3b: Secondary | ICD-10-CM | POA: Diagnosis not present

## 2022-11-04 LAB — LAB REPORT - SCANNED
Creatinine, POC: 146.2 mg/dL
EGFR: 41

## 2022-11-11 ENCOUNTER — Encounter: Payer: Self-pay | Admitting: Podiatry

## 2022-11-11 ENCOUNTER — Ambulatory Visit (INDEPENDENT_AMBULATORY_CARE_PROVIDER_SITE_OTHER): Payer: Medicare HMO | Admitting: Podiatry

## 2022-11-11 DIAGNOSIS — S86212A Strain of muscle(s) and tendon(s) of anterior muscle group at lower leg level, left leg, initial encounter: Secondary | ICD-10-CM | POA: Diagnosis not present

## 2022-11-11 DIAGNOSIS — S86219A Strain of muscle(s) and tendon(s) of anterior muscle group at lower leg level, unspecified leg, initial encounter: Secondary | ICD-10-CM

## 2022-11-11 NOTE — Progress Notes (Signed)
  Subjective:  Patient ID: Devin Howell, male    DOB: 1951/03/21,  MRN: [id number]  Chief Complaint  Patient presents with   Routine Post Op    "It's doing well."    DOS: 10/15/2022 Procedure: Tibialis anterior tendon repair  72 y.o. male returns for post-op check.  Not having any pain  Review of Systems: Negative except as noted in the HPI. Denies N/V/F/Ch.   Objective:  There were no vitals filed for this visit. There is no height or weight on file to calculate BMI. Constitutional Well developed. Well nourished.  Vascular Foot warm and well perfused. Capillary refill normal to all digits.  Calf is soft and supple, no posterior calf or knee pain, negative Homans' sign  Neurologic Normal speech. Oriented to person, place, and time. Epicritic sensation to light touch grossly present bilaterally.  Dermatologic Skin healing well without signs of infection. Skin edges well coapted without signs of infection.  Orthopedic: Good early range of motion.  He has no tenderness to palpation noted about the surgical site.    Assessment:   1. Tear of tibialis anterior tendon    Plan:  Patient was evaluated and treated and all questions answered.  S/p foot surgery left -Progressing as expected post-operatively.  Has good early range of motion present.  I transitioned him from the cast to a boot that he will continue nonweightbearing for and maintain the boot.  I did advise him he may remove the boot 3-4 times a day and perform very gentle active range of motion without resistance.  Referral for physical therapy sent and he will begin this ASAP.  Return in 2 weeks to see Devin Howell and likely should be able to transition back to weightbearing.   Return in about 2 weeks (around 11/25/2022) for post op (no x-rays).

## 2022-11-17 NOTE — Therapy (Signed)
OUTPATIENT PHYSICAL THERAPY LOWER EXTREMITY EVALUATION   Patient Name: Devin Howell MRN: [id number] DOB:24-Nov-1950, 72 y.o., male Today's Date: 11/22/2022  END OF SESSION:  PT End of Session - 11/22/22 1607     Visit Number 1    Number of Visits 20    Date for PT Re-Evaluation 01/31/23    PT Start Time 1308    PT Stop Time 1347    PT Time Calculation (min) 39 min    Activity Tolerance Patient tolerated treatment well    Behavior During Therapy [facility] for tasks assessed/performed             Past Medical History:  Diagnosis Date   AKI (acute kidney injury) (HCC) 08/15/2020   Cataract    Chronic gout    CKD (chronic kidney disease)    pt. denies   Erectile dysfunction    GERD (gastroesophageal reflux disease)    Herpes simplex    Hx of adenomatous polyp of colon 04/12/2017   Hyperlipidemia    pt. denies   Hypertension    pt. denies   OSA on CPAP    Sleep apnea    cpap   Subarachnoid hematoma (HCC)    Subdural hematoma (HCC)    Testosterone deficiency    Past Surgical History:  Procedure Laterality Date   COLONOSCOPY     ESOPHAGOGASTRODUODENOSCOPY     TONSILLECTOMY AND ADENOIDECTOMY  1960   Patient Active Problem List   Diagnosis Date Noted   Chronic neck pain 02/04/2021   Alcohol dependence (HCC) 02/04/2021   Elevated blood pressure reading 01/19/2021   Obesity (BMI 30-39.9) 01/19/2021   Abnormal gait 12/24/2020   Bilateral lower extremity edema 12/24/2020   Thrombocytopenia (HCC) 09/08/2020   LFT elevation    Hypothyroidism 02/13/2019   Hx of adenomatous polyp of colon 04/12/2017   Anxiety and depression 03/01/2017   Erectile dysfunction 12/13/2016   Preventative health care 12/13/2016   Hiccups 06/10/2016   Chronic gout without tophus 04/20/2016   Genital herpes simplex 04/20/2016   Testosterone deficiency 04/20/2016   CKD (chronic kidney disease) stage 3, GFR 30-59 ml/min (HCC) 04/20/2016   Essential hypertension 04/20/2016   Hyperlipidemia  04/20/2016    PCP: Doreene Nest, NP  REFERRING PROVIDER: Elinor Parkinson, DPM  REFERRING DIAG: [id number] (ICD-10-CM) - Tear of tibialis anterior tendon  THERAPY DIAG:  Pain in left ankle and joints of left foot - Plan: PT plan of care cert/re-cert  Muscle weakness (generalized) - Plan: PT plan of care cert/re-cert  Difficulty in walking, not elsewhere classified - Plan: PT plan of care cert/re-cert  Rationale for Evaluation and Treatment: Rehabilitation  ONSET DATE: DOS 10/15/22 LT FOOT PRIMARY REPAIR TIBIALIS ANTERIOR TENDON W/GRAFT W/CAST APPLICATION   SUBJECTIVE:   SUBJECTIVE STATEMENT: Eval: States he doesn't have any pain. States he has difficulties donning compression sock. Been good about non -weightbearing on his left leg. States that overall he feels he is doing well. Reports he originally hurt his foot 49 some years ago and he slipped on a side walk and broke his ankle and then wear and tear resulted in the tear. States prior to the surgery he was having difficulty with walking and he felt like his foot would just flop down.     PERTINENT HISTORY: HTN - controlled with medication PAIN:  Are you having pain? Yes: NPRS scale: minima/10 Pain location: front of ankle  Pain description: stiffness Aggravating factors: motion Relieving factors: motion  PRECAUTIONS: None No WB  exercise until 6 weeks after surgery, can start ROM   RED FLAGS: None   WEIGHT BEARING RESTRICTIONS: Yes NWB in boot  FALLS:  Has patient fallen in last 6 months? Yes. Number of falls 1 time a few weeks back while in case  LIVING ENVIRONMENT: Lives with: lives with their family Lives in: House/apartment Stairs: Yes: Internal: 12 steps; on left going up Has following equipment at home: Crutches and scooter  OCCUPATION: retired  PLOF: Independent  PATIENT GOALS: to be able to walk again and get back to function   NEXT MD VISIT: 11/30/22  OBJECTIVE:    PATIENT SURVEYS:  FOTO unable  to perform internet down - will gather information next session  COGNITION: Overall cognitive status: Within functional limits for tasks assessed     SENSATION: Not tested  EDEMA/Observation: Circumferential: at ankle L 29.0 cm, right 27.75 cm -2 small wounds on bottom of left foot-clean but patient unaware -Main surgical incision still healing with no signs of infection but a couple of spots are still scabbed over  -2 portal sites on either side of lower leg with stitches still intact-irritation noted around the portal sites Foot blue/purple initially when in dependent position - improved to pink after elevation and edema management     PALPATION: No tenderness to palpation along Forefoot    LE Measurements Lower Extremity Right EVAL Left EVAL   A/PROM MMT A/PROM MMT  Hip Flexion      Hip Extension      Hip Abduction      Hip Adduction      Hip Internal rotation      Hip External rotation      Knee Flexion      Knee Extension      Ankle Dorsiflexion      Ankle Plantarflexion      Ankle Inversion      Ankle Eversion       (Blank rows = not tested) * pain   LOWER EXTREMITY SPECIAL TESTS:  Negative Homans' sign  FUNCTIONAL TESTS:  Able to transition from sit to stand and stand to sit and from scooter to sitting independently and no pain  GAIT: Uses scooter for left leg and propels himself forward unable to walk as he is nonweightbearing on left lower extremity   TODAY'S TREATMENT:                                                                                                                              DATE:   11/22/2022  Therapeutic Exercise:  Aerobic: Supine: Prone:  Seated:  Standing: Neuromuscular Re-education: Manual Therapy: Edema mobilization in upper calf 5 minutes Therapeutic Activity: Self Care: Wound care management, proper way to don and doff compression garment, importance of elevation and gentle ankle mobility throughout the day while  sitting, and skin care and importance of edema management in regards to healing, educated patient keeping surgical incisions dry and trying not to  irritate it when donning/doffing compression garment, on taking off boot when sitting and awake to do ankle pumps and elevation  Trigger Point Dry Needling:  Modalities:    PATIENT EDUCATION:  Education details: on current presentation, on HEP, on  POC, and on self-care items listed above Person educated: Patient Education method: Programmer, multimedia, Demonstration, and Handouts Education comprehension: verbalized understanding   HOME EXERCISE PROGRAM: No formal program at this time  ASSESSMENT:  CLINICAL IMPRESSION: Patient presents today after under going left tibialis anterior surgical repair on 10/15/2022.  Patient presents with significant swelling throughout lower extremity and incisions still healing.  Noted that patient has 2 small wounds on the ball of the foot and moisture between toes.  Session focused on education of wound care/incision care as well as lower extremity management.  Stressed continued focus of edema management and elevation above heart.  Discussed using compression garment but might need to transition to full size compression garment to below knee as current compression garment rests on one of his incision sites causing irritation.  Patient would greatly benefit from skilled physical therapy to improve overall function, range of motion, and ability to walk at home and in the community.  OBJECTIVE IMPAIRMENTS: Abnormal gait, decreased activity tolerance, decreased balance, decreased coordination, decreased knowledge of use of DME, decreased mobility, difficulty walking, decreased ROM, decreased strength, increased edema, improper body mechanics, postural dysfunction, and pain.   ACTIVITY LIMITATIONS: sitting, standing, squatting, stairs, transfers, and locomotion level  PARTICIPATION LIMITATIONS: meal prep, cleaning, and community  activity  PERSONAL FACTORS: Age and Fitness are also affecting patient's functional outcome.   REHAB POTENTIAL: Good  CLINICAL DECISION MAKING: Stable/uncomplicated  EVALUATION COMPLEXITY: Low   GOALS: Goals reviewed with patient? yes  SHORT TERM GOALS: Target date: 12/27/2022  Patient will be independent in self management strategies to improve quality of life and functional outcomes. Baseline: New Program Goal status: INITIAL  2.  Patient will report at least 50% improvement in overall symptoms and/or function to demonstrate improved functional mobility Baseline: 0% better Goal status: INITIAL  3.  Patient's surgical incisions and wounds on bottom of foot will be completely healed to reduce risk of infection. Baseline: Current open wounds on bottom of foot Goal status: INITIAL  4.  Per MD protocol patient will be able to stand on left lower extremity for at least 30 seconds without pain to demonstrate improved weightbearing tolerance to left lower leg. Baseline: Currently unable and restricted Goal status: INITIAL    LONG TERM GOALS: Target date: 01/31/2023   Patient will report at least 75% improvement in overall symptoms and/or function to demonstrate improved functional mobility Baseline: 0% better Goal status: INITIAL  2.  Patient will improve score on FOTO outcomes measure to projected score to demonstrate overall improved function and QOL Baseline: see above Goal status: INITIAL  3.  Per MD protocol patient will be able to ambulate in regular shoe without severe pain for at least 10 minutes to improve ability to walk around home and community. Baseline: unable Goal status: INITIAL  4.  Patient will be able to a send and descend stairs in home with step through gait and use of railing as needed to improve ability to go up and down the stairs in his own home Baseline: Unable Goal status: INITIAL    PLAN:  PT FREQUENCY: 2x/week  PT DURATION: 10  weeks  PLANNED INTERVENTIONS: Therapeutic exercises, Therapeutic activity, Neuromuscular re-education, Balance training, Gait training, Patient/Family education, Self Care, Joint mobilization, Joint  manipulation, Stair training, Vestibular training, Canalith repositioning, Orthotic/Fit training, Prosthetic training, DME instructions, Aquatic Therapy, Dry Needling, Electrical stimulation, Spinal manipulation, Spinal mobilization, Cryotherapy, Moist heat, Taping, Traction, Ultrasound, Ionotophoresis 4mg /ml Dexamethasone, Manual therapy, and Re-evaluation.   PLAN FOR NEXT SESSION: Perform FOTO, check wounds on bottom of foot, check portal sites on either side of leg, gentle edema management, gentle full range of motion, remind patient to acquire post op protocol and to get stitches removed   4:26 PM, 11/22/22 Tereasa Coop, DPT Physical Therapy with [facility]

## 2022-11-22 ENCOUNTER — Encounter: Payer: Self-pay | Admitting: Physical Therapy

## 2022-11-22 ENCOUNTER — Ambulatory Visit: Payer: Medicare HMO | Admitting: Physical Therapy

## 2022-11-22 DIAGNOSIS — M6281 Muscle weakness (generalized): Secondary | ICD-10-CM

## 2022-11-22 DIAGNOSIS — D2371 Other benign neoplasm of skin of right lower limb, including hip: Secondary | ICD-10-CM | POA: Diagnosis not present

## 2022-11-22 DIAGNOSIS — D225 Melanocytic nevi of trunk: Secondary | ICD-10-CM | POA: Diagnosis not present

## 2022-11-22 DIAGNOSIS — Z85828 Personal history of other malignant neoplasm of skin: Secondary | ICD-10-CM | POA: Diagnosis not present

## 2022-11-22 DIAGNOSIS — R262 Difficulty in walking, not elsewhere classified: Secondary | ICD-10-CM

## 2022-11-22 DIAGNOSIS — L814 Other melanin hyperpigmentation: Secondary | ICD-10-CM | POA: Diagnosis not present

## 2022-11-22 DIAGNOSIS — M25572 Pain in left ankle and joints of left foot: Secondary | ICD-10-CM | POA: Diagnosis not present

## 2022-11-25 ENCOUNTER — Encounter: Payer: Self-pay | Admitting: Physical Therapy

## 2022-11-25 ENCOUNTER — Ambulatory Visit: Payer: Medicare HMO | Admitting: Physical Therapy

## 2022-11-25 DIAGNOSIS — M25572 Pain in left ankle and joints of left foot: Secondary | ICD-10-CM | POA: Diagnosis not present

## 2022-11-25 DIAGNOSIS — R262 Difficulty in walking, not elsewhere classified: Secondary | ICD-10-CM

## 2022-11-25 DIAGNOSIS — M6281 Muscle weakness (generalized): Secondary | ICD-10-CM | POA: Diagnosis not present

## 2022-11-25 NOTE — Therapy (Signed)
OUTPATIENT PHYSICAL THERAPY LOWER EXTREMITY TREATMENT    Patient Name: Devin Howell MRN: [id number] DOB:08-16-1950, 72 y.o., male Today's Date: 11/25/2022  END OF SESSION:  PT End of Session - 11/25/22 1259     Visit Number 2    Number of Visits 20    Date for PT Re-Evaluation 01/31/23    PT Start Time 1300    PT Stop Time 1332    PT Time Calculation (min) 32 min    Activity Tolerance Patient tolerated treatment well    Behavior During Therapy [facility] for tasks assessed/performed             Past Medical History:  Diagnosis Date   AKI (acute kidney injury) (HCC) 08/15/2020   Cataract    Chronic gout    CKD (chronic kidney disease)    pt. denies   Erectile dysfunction    GERD (gastroesophageal reflux disease)    Herpes simplex    Hx of adenomatous polyp of colon 04/12/2017   Hyperlipidemia    pt. denies   Hypertension    pt. denies   OSA on CPAP    Sleep apnea    cpap   Subarachnoid hematoma (HCC)    Subdural hematoma (HCC)    Testosterone deficiency    Past Surgical History:  Procedure Laterality Date   COLONOSCOPY     ESOPHAGOGASTRODUODENOSCOPY     TONSILLECTOMY AND ADENOIDECTOMY  1960   Patient Active Problem List   Diagnosis Date Noted   Chronic neck pain 02/04/2021   Alcohol dependence (HCC) 02/04/2021   Elevated blood pressure reading 01/19/2021   Obesity (BMI 30-39.9) 01/19/2021   Abnormal gait 12/24/2020   Bilateral lower extremity edema 12/24/2020   Thrombocytopenia (HCC) 09/08/2020   LFT elevation    Hypothyroidism 02/13/2019   Hx of adenomatous polyp of colon 04/12/2017   Anxiety and depression 03/01/2017   Erectile dysfunction 12/13/2016   Preventative health care 12/13/2016   Hiccups 06/10/2016   Chronic gout without tophus 04/20/2016   Genital herpes simplex 04/20/2016   Testosterone deficiency 04/20/2016   CKD (chronic kidney disease) stage 3, GFR 30-59 ml/min (HCC) 04/20/2016   Essential hypertension 04/20/2016   Hyperlipidemia  04/20/2016    PCP: Doreene Nest, NP  REFERRING PROVIDER: Elinor Parkinson, DPM  REFERRING DIAG: [phone] (ICD-10-CM) - Tear of tibialis anterior tendon  THERAPY DIAG:  Pain in left ankle and joints of left foot  Muscle weakness (generalized)  Difficulty in walking, not elsewhere classified  Rationale for Evaluation and Treatment: Rehabilitation  ONSET DATE: DOS 10/15/22 LT FOOT PRIMARY REPAIR TIBIALIS ANTERIOR TENDON W/GRAFT W/CAST APPLICATION   SUBJECTIVE:   SUBJECTIVE STATEMENT: 11/25/2022 Pt states no pain. Thinks swelling is better in foot, has been elevating his foot more often. Seeing MD/surgeon on 9/3. Will ask about stitches.   Eval: States he doesn't have any pain. States he has difficulties donning compression sock. Been good about non -weightbearing on his left leg. States that overall he feels he is doing well. Reports he originally hurt his foot 50 some years ago and he slipped on a side walk and broke his ankle and then wear and tear resulted in the tear. States prior to the surgery he was having difficulty with walking and he felt like his foot would just flop down.    PERTINENT HISTORY: HTN - controlled with medication PAIN:  Are you having pain? Yes: NPRS scale: minima/10 Pain location: front of ankle  Pain description: stiffness Aggravating factors: motion Relieving  factors: motion  PRECAUTIONS: None No WB exercise until 6 weeks after surgery, can start ROM   RED FLAGS: None   WEIGHT BEARING RESTRICTIONS: Yes NWB in boot  FALLS:  Has patient fallen in last 6 months? Yes. Number of falls 1 time a few weeks back while in case  LIVING ENVIRONMENT: Lives with: lives with their family Lives in: House/apartment Stairs: Yes: Internal: 12 steps; on left going up Has following equipment at home: Crutches and scooter  OCCUPATION: retired  PLOF: Independent  PATIENT GOALS: to be able to walk again and get back to function   NEXT MD VISIT:  11/30/22  OBJECTIVE:    PATIENT SURVEYS:  FOTO:  Initial : 45.2    COGNITION: Overall cognitive status: Within functional limits for tasks assessed     SENSATION: Not tested  EDEMA/Observation: Circumferential: at ankle L 29.0 cm, right 27.75 cm -2 small wounds on bottom of left foot-clean but patient unaware -Main surgical incision still healing with no signs of infection but a couple of spots are still scabbed over  -2 portal sites on either side of lower leg with stitches still intact-irritation noted around the portal sites Foot blue/purple initially when in dependent position - improved to pink after elevation and edema management     PALPATION: No tenderness to palpation along Forefoot    LE Measurements Lower Extremity Right EVAL Left EVAL   A/PROM MMT A/PROM MMT  Hip Flexion      Hip Extension      Hip Abduction      Hip Adduction      Hip Internal rotation      Hip External rotation      Knee Flexion      Knee Extension      Ankle Dorsiflexion      Ankle Plantarflexion      Ankle Inversion      Ankle Eversion       (Blank rows = not tested) * pain   LOWER EXTREMITY SPECIAL TESTS:  Negative Homans' sign  FUNCTIONAL TESTS:  Able to transition from sit to stand and stand to sit and from scooter to sitting independently and no pain  GAIT: Uses scooter for left leg and propels himself forward unable to walk as he is nonweightbearing on left lower extremity   TODAY'S TREATMENT:                                                                                                                              DATE:   11/25/2022  Therapeutic Exercise:  Aerobic: Supine:  Ankle ROM: pf/df and inv/ev each 2 x 20;   toe flex/ext 2 x 10 Prone:  Seated:  LAQ on R x 10   Standing: Neuromuscular Re-education: Manual Therapy:  Therapeutic Activity: Self Care: Discussed obtaining knee high compression sock for improved swelling,  proper way to don and doff  compression garment, importance of elevation and gentle ankle mobility  throughout the day while sitting, and skin care and importance of edema management in regards to healing,  on taking off boot when sitting and awake to do ankle pumps and elevation  Trigger Point Dry Needling:  Modalities:    PATIENT EDUCATION:  Education details: on current presentation, on HEP, on  POC, and on self-care items listed above Person educated: Patient Education method: Programmer, multimedia, Demonstration, and Handouts Education comprehension: verbalized understanding   HOME EXERCISE PROGRAM: No formal program at this time  ASSESSMENT:  CLINICAL IMPRESSION: 11/25/2022 Pt with some improvement of swelling from last visit, but still will benefit from full length compression sock, pt will obtain. He appears to have good healing of his incision. We discussed need for stitches to be removed at next MD appt. Updated HEP to include ankle, toe and knee ROM. Pt with good understanding of ROM at least a  few times throughout the day.   Eval: Patient presents today after under going left tibialis anterior surgical repair on 10/15/2022.  Patient presents with significant swelling throughout lower extremity and incisions still healing.  Noted that patient has 2 small wounds on the ball of the foot and moisture between toes.  Session focused on education of wound care/incision care as well as lower extremity management.  Stressed continued focus of edema management and elevation above heart.  Discussed using compression garment but might need to transition to full size compression garment to below knee as current compression garment rests on one of his incision sites causing irritation.  Patient would greatly benefit from skilled physical therapy to improve overall function, range of motion, and ability to walk at home and in the community.  OBJECTIVE IMPAIRMENTS: Abnormal gait, decreased activity tolerance, decreased balance, decreased  coordination, decreased knowledge of use of DME, decreased mobility, difficulty walking, decreased ROM, decreased strength, increased edema, improper body mechanics, postural dysfunction, and pain.   ACTIVITY LIMITATIONS: sitting, standing, squatting, stairs, transfers, and locomotion level  PARTICIPATION LIMITATIONS: meal prep, cleaning, and community activity  PERSONAL FACTORS: Age and Fitness are also affecting patient's functional outcome.   REHAB POTENTIAL: Good  CLINICAL DECISION MAKING: Stable/uncomplicated  EVALUATION COMPLEXITY: Low   GOALS: Goals reviewed with patient? yes  SHORT TERM GOALS: Target date: 12/27/2022  Patient will be independent in self management strategies to improve quality of life and functional outcomes. Baseline: New Program Goal status: INITIAL  2.  Patient will report at least 50% improvement in overall symptoms and/or function to demonstrate improved functional mobility Baseline: 0% better Goal status: INITIAL  3.  Patient's surgical incisions and wounds on bottom of foot will be completely healed to reduce risk of infection. Baseline: Current open wounds on bottom of foot Goal status: INITIAL  4.  Per MD protocol patient will be able to stand on left lower extremity for at least 30 seconds without pain to demonstrate improved weightbearing tolerance to left lower leg. Baseline: Currently unable and restricted Goal status: INITIAL    LONG TERM GOALS: Target date: 01/31/2023   Patient will report at least 75% improvement in overall symptoms and/or function to demonstrate improved functional mobility Baseline: 0% better Goal status: INITIAL  2.  Patient will improve score on FOTO outcomes measure to projected score to demonstrate overall improved function and QOL Baseline: see above Goal status: INITIAL  3.  Per MD protocol patient will be able to ambulate in regular shoe without severe pain for at least 10 minutes to improve ability to  walk around home and community.  Baseline: unable Goal status: INITIAL  4.  Patient will be able to a send and descend stairs in home with step through gait and use of railing as needed to improve ability to go up and down the stairs in his own home Baseline: Unable Goal status: INITIAL    PLAN:  PT FREQUENCY: 2x/week  PT DURATION: 10 weeks  PLANNED INTERVENTIONS: Therapeutic exercises, Therapeutic activity, Neuromuscular re-education, Balance training, Gait training, Patient/Family education, Self Care, Joint mobilization, Joint manipulation, Stair training, Vestibular training, Canalith repositioning, Orthotic/Fit training, Prosthetic training, DME instructions, Aquatic Therapy, Dry Needling, Electrical stimulation, Spinal manipulation, Spinal mobilization, Cryotherapy, Moist heat, Taping, Traction, Ultrasound, Ionotophoresis 4mg /ml Dexamethasone, Manual therapy, and Re-evaluation.   PLAN FOR NEXT SESSION:  check wounds on bottom of foot, check portal sites on either side of leg, gentle edema management, gentle full range of motion, remind patient to acquire post op protocol and to get stitches removed   Sedalia Muta, PT, DPT 1:38 PM  11/25/22

## 2022-11-30 ENCOUNTER — Encounter: Payer: Self-pay | Admitting: Podiatry

## 2022-11-30 ENCOUNTER — Encounter: Payer: Medicare HMO | Admitting: Podiatry

## 2022-11-30 ENCOUNTER — Ambulatory Visit (INDEPENDENT_AMBULATORY_CARE_PROVIDER_SITE_OTHER): Payer: Medicare HMO | Admitting: Podiatry

## 2022-11-30 DIAGNOSIS — S86219A Strain of muscle(s) and tendon(s) of anterior muscle group at lower leg level, unspecified leg, initial encounter: Secondary | ICD-10-CM

## 2022-11-30 DIAGNOSIS — Z9889 Other specified postprocedural states: Secondary | ICD-10-CM

## 2022-11-30 NOTE — Progress Notes (Signed)
He presents today for his first fourth postop visit date of surgery 10/15/2018 for secondary repair tibialis anterior tendon with graft and cast with gastroc recession.  He states that he hardly had any pain he denies fever chills nausea vomiting muscle aches pains calf pain back pain chest pain shortness of breath.  Presents with his cam boot and a knee scooter.  He is nonweightbearing status.  Objective: Vitals are stable alert oriented x 3 all of the staples and stitches are removed he has minimal edema on incision appears to be healing very nicely no erythema cellulitis drainage or odor.  He has dorsiflexion with easy palpation of the tibialis anterior tendon graft.  Assessment: Well-healing surgical foot.  Plan: I am going to have him start with his partial weightbearing this week progressing to full weightbearing by the first of next week and then I will follow-up with him in 2 weeks.  He will continue physical therapy and will not overdo it.  I will follow-up with him questions or concerns if necessary

## 2022-12-02 ENCOUNTER — Ambulatory Visit: Payer: Medicare HMO | Admitting: Physical Therapy

## 2022-12-02 ENCOUNTER — Encounter: Payer: Self-pay | Admitting: Physical Therapy

## 2022-12-02 DIAGNOSIS — R262 Difficulty in walking, not elsewhere classified: Secondary | ICD-10-CM

## 2022-12-02 DIAGNOSIS — M6281 Muscle weakness (generalized): Secondary | ICD-10-CM | POA: Diagnosis not present

## 2022-12-02 DIAGNOSIS — M25572 Pain in left ankle and joints of left foot: Secondary | ICD-10-CM | POA: Diagnosis not present

## 2022-12-02 NOTE — Therapy (Signed)
OUTPATIENT PHYSICAL THERAPY LOWER EXTREMITY TREATMENT    Patient Name: Devin Howell MRN: [id number] DOB:31-Dec-1950, 72 y.o., male Today's Date: 12/02/2022  END OF SESSION:  PT End of Session - 12/02/22 1432     Visit Number 3    Number of Visits 20    Date for PT Re-Evaluation 01/31/23    PT Start Time 1433    PT Stop Time 1511    PT Time Calculation (min) 38 min    Activity Tolerance Patient tolerated treatment well    Behavior During Therapy [facility] for tasks assessed/performed             Past Medical History:  Diagnosis Date   AKI (acute kidney injury) (HCC) 08/15/2020   Cataract    Chronic gout    CKD (chronic kidney disease)    pt. denies   Erectile dysfunction    GERD (gastroesophageal reflux disease)    Herpes simplex    Hx of adenomatous polyp of colon 04/12/2017   Hyperlipidemia    pt. denies   Hypertension    pt. denies   OSA on CPAP    Sleep apnea    cpap   Subarachnoid hematoma (HCC)    Subdural hematoma (HCC)    Testosterone deficiency    Past Surgical History:  Procedure Laterality Date   COLONOSCOPY     ESOPHAGOGASTRODUODENOSCOPY     TONSILLECTOMY AND ADENOIDECTOMY  1960   Patient Active Problem List   Diagnosis Date Noted   Chronic neck pain 02/04/2021   Alcohol dependence (HCC) 02/04/2021   Elevated blood pressure reading 01/19/2021   Obesity (BMI 30-39.9) 01/19/2021   Abnormal gait 12/24/2020   Bilateral lower extremity edema 12/24/2020   Thrombocytopenia (HCC) 09/08/2020   LFT elevation    Hypothyroidism 02/13/2019   Hx of adenomatous polyp of colon 04/12/2017   Anxiety and depression 03/01/2017   Erectile dysfunction 12/13/2016   Preventative health care 12/13/2016   Hiccups 06/10/2016   Chronic gout without tophus 04/20/2016   Genital herpes simplex 04/20/2016   Testosterone deficiency 04/20/2016   CKD (chronic kidney disease) stage 3, GFR 30-59 ml/min (HCC) 04/20/2016   Essential hypertension 04/20/2016   Hyperlipidemia  04/20/2016    PCP: Doreene Nest, NP  REFERRING PROVIDER: Elinor Parkinson, DPM  REFERRING DIAG: [phone] (ICD-10-CM) - Tear of tibialis anterior tendon  THERAPY DIAG:  Pain in left ankle and joints of left foot  Muscle weakness (generalized)  Difficulty in walking, not elsewhere classified  Rationale for Evaluation and Treatment: Rehabilitation  ONSET DATE: DOS 10/15/22 LT FOOT PRIMARY REPAIR TIBIALIS ANTERIOR TENDON W/GRAFT W/CAST APPLICATION   SUBJECTIVE:   SUBJECTIVE STATEMENT: 12/02/2022 Pt state no pain, swelling is down and doing well. WBAT.  Eval: States he doesn't have any pain. States he has difficulties donning compression sock. Been good about non -weightbearing on his left leg. States that overall he feels he is doing well. Reports he originally hurt his foot 50 some years ago and he slipped on a side walk and broke his ankle and then wear and tear resulted in the tear. States prior to the surgery he was having difficulty with walking and he felt like his foot would just flop down.    PERTINENT HISTORY: HTN - controlled with medication PAIN:  Are you having pain? Yes: NPRS scale: minima/10 Pain location: front of ankle  Pain description: stiffness Aggravating factors: motion Relieving factors: motion  PRECAUTIONS: None No WB exercise until 6 weeks after surgery, can start  ROM   RED FLAGS: None   WEIGHT BEARING RESTRICTIONS: Yes NWB in boot  FALLS:  Has patient fallen in last 6 months? Yes. Number of falls 1 time a few weeks back while in case  LIVING ENVIRONMENT: Lives with: lives with their family Lives in: House/apartment Stairs: Yes: Internal: 12 steps; on left going up Has following equipment at home: Crutches and scooter  OCCUPATION: retired  PLOF: Independent  PATIENT GOALS: to be able to walk again and get back to function   NEXT MD VISIT: 11/30/22  OBJECTIVE:    PATIENT SURVEYS:  FOTO:  Initial : 45.2    COGNITION: Overall  cognitive status: Within functional limits for tasks assessed     SENSATION: Not tested  EDEMA/Observation: Circumferential: at ankle L 29.0 cm, right 27.75 cm -2 small wounds on bottom of left foot-clean but patient unaware -Main surgical incision still healing with no signs of infection but a couple of spots are still scabbed over  -2 portal sites on either side of lower leg with stitches still intact-irritation noted around the portal sites Foot blue/purple initially when in dependent position - improved to pink after elevation and edema management     PALPATION: No tenderness to palpation along Forefoot    LE Measurements Lower Extremity Right EVAL Left EVAL   A/PROM MMT A/PROM MMT  Hip Flexion      Hip Extension      Hip Abduction      Hip Adduction      Hip Internal rotation      Hip External rotation      Knee Flexion      Knee Extension      Ankle Dorsiflexion      Ankle Plantarflexion      Ankle Inversion      Ankle Eversion       (Blank rows = not tested) * pain   LOWER EXTREMITY SPECIAL TESTS:  Negative Homans' sign  FUNCTIONAL TESTS:  Able to transition from sit to stand and stand to sit and from scooter to sitting independently and no pain  GAIT: Uses scooter for left leg and propels himself forward unable to walk as he is nonweightbearing on left lower extremity   TODAY'S TREATMENT:                                                                                                                              DATE:   12/02/2022  Therapeutic Exercise:  Aerobic: Supine:  Ankle ROM: pf/df and inv/ev each 2 x 20;   Prone:  Seated: heel raises 2x10 B, toe raises x20, inversion/eversion x10 L, toe flexion/extension x20 L  Standing:weight shifts lateral and forward/back up to 100% lateral L Neuromuscular Re-education: Manual Therapy: STM/edema mobilization and scar mobilization- L leg elevated 15 minutes Therapeutic Activity: Self Care:  Trigger Point  Dry Needling:  Modalities:    PATIENT EDUCATION:  Education details: on current presentation, on HEP,  on  POC, and on self-care items listed above Person educated: Patient Education method: Explanation, Demonstration, and Handouts Education comprehension: verbalized understanding   HOME EXERCISE PROGRAM: No formal program at this time  ASSESSMENT:  CLINICAL IMPRESSION: 12/02/2022 Session focused on manual and progression of exercises. Added all new exercises to HEP. Overall patient tolerated everything well and no pain noted during or after session. Educated patient in self mobilization of scar. Will continue to progress exercises as tolerated.   Eval: Patient presents today after under going left tibialis anterior surgical repair on 10/15/2022.  Patient presents with significant swelling throughout lower extremity and incisions still healing.  Noted that patient has 2 small wounds on the ball of the foot and moisture between toes.  Session focused on education of wound care/incision care as well as lower extremity management.  Stressed continued focus of edema management and elevation above heart.  Discussed using compression garment but might need to transition to full size compression garment to below knee as current compression garment rests on one of his incision sites causing irritation.  Patient would greatly benefit from skilled physical therapy to improve overall function, range of motion, and ability to walk at home and in the community.  OBJECTIVE IMPAIRMENTS: Abnormal gait, decreased activity tolerance, decreased balance, decreased coordination, decreased knowledge of use of DME, decreased mobility, difficulty walking, decreased ROM, decreased strength, increased edema, improper body mechanics, postural dysfunction, and pain.   ACTIVITY LIMITATIONS: sitting, standing, squatting, stairs, transfers, and locomotion level  PARTICIPATION LIMITATIONS: meal prep, cleaning, and community  activity  PERSONAL FACTORS: Age and Fitness are also affecting patient's functional outcome.   REHAB POTENTIAL: Good  CLINICAL DECISION MAKING: Stable/uncomplicated  EVALUATION COMPLEXITY: Low   GOALS: Goals reviewed with patient? yes  SHORT TERM GOALS: Target date: 12/27/2022  Patient will be independent in self management strategies to improve quality of life and functional outcomes. Baseline: New Program Goal status: INITIAL  2.  Patient will report at least 50% improvement in overall symptoms and/or function to demonstrate improved functional mobility Baseline: 0% better Goal status: INITIAL  3.  Patient's surgical incisions and wounds on bottom of foot will be completely healed to reduce risk of infection. Baseline: Current open wounds on bottom of foot Goal status: INITIAL  4.  Per MD protocol patient will be able to stand on left lower extremity for at least 30 seconds without pain to demonstrate improved weightbearing tolerance to left lower leg. Baseline: Currently unable and restricted Goal status: INITIAL    LONG TERM GOALS: Target date: 01/31/2023   Patient will report at least 75% improvement in overall symptoms and/or function to demonstrate improved functional mobility Baseline: 0% better Goal status: INITIAL  2.  Patient will improve score on FOTO outcomes measure to projected score to demonstrate overall improved function and QOL Baseline: see above Goal status: INITIAL  3.  Per MD protocol patient will be able to ambulate in regular shoe without severe pain for at least 10 minutes to improve ability to walk around home and community. Baseline: unable Goal status: INITIAL  4.  Patient will be able to a send and descend stairs in home with step through gait and use of railing as needed to improve ability to go up and down the stairs in his own home Baseline: Unable Goal status: INITIAL    PLAN:  PT FREQUENCY: 2x/week  PT DURATION: 10  weeks  PLANNED INTERVENTIONS: Therapeutic exercises, Therapeutic activity, Neuromuscular re-education, Balance training, Gait training, Patient/Family education,  Self Care, Joint mobilization, Joint manipulation, Stair training, Vestibular training, Canalith repositioning, Orthotic/Fit training, Prosthetic training, DME instructions, Aquatic Therapy, Dry Needling, Electrical stimulation, Spinal manipulation, Spinal mobilization, Cryotherapy, Moist heat, Taping, Traction, Ultrasound, Ionotophoresis 4mg /ml Dexamethasone, Manual therapy, and Re-evaluation.   PLAN FOR NEXT SESSION:  WBAT then full WB in boot - ROM in foot and strengthening.   3:26 PM, 12/02/22 Tereasa Coop, DPT Physical Therapy with [facility]

## 2022-12-06 ENCOUNTER — Encounter: Payer: Medicare HMO | Admitting: Physical Therapy

## 2022-12-07 ENCOUNTER — Encounter: Payer: Self-pay | Admitting: Physical Therapy

## 2022-12-07 ENCOUNTER — Ambulatory Visit: Payer: Medicare HMO | Admitting: Physical Therapy

## 2022-12-07 DIAGNOSIS — R262 Difficulty in walking, not elsewhere classified: Secondary | ICD-10-CM | POA: Diagnosis not present

## 2022-12-07 DIAGNOSIS — M6281 Muscle weakness (generalized): Secondary | ICD-10-CM

## 2022-12-07 DIAGNOSIS — M25572 Pain in left ankle and joints of left foot: Secondary | ICD-10-CM

## 2022-12-07 NOTE — Therapy (Signed)
OUTPATIENT PHYSICAL THERAPY LOWER EXTREMITY TREATMENT    Patient Name: Devin Howell MRN: [id number] DOB:Nov 19, 1950, 72 y.o., male Today's Date: 12/07/2022  END OF SESSION:  PT End of Session - 12/07/22 1430     Visit Number 4    Number of Visits 20    Date for PT Re-Evaluation 01/31/23    PT Start Time 1431    PT Stop Time 1509    PT Time Calculation (min) 38 min    Activity Tolerance Patient tolerated treatment well    Behavior During Therapy [facility] for tasks assessed/performed             Past Medical History:  Diagnosis Date   AKI (acute kidney injury) (HCC) 08/15/2020   Cataract    Chronic gout    CKD (chronic kidney disease)    pt. denies   Erectile dysfunction    GERD (gastroesophageal reflux disease)    Herpes simplex    Hx of adenomatous polyp of colon 04/12/2017   Hyperlipidemia    pt. denies   Hypertension    pt. denies   OSA on CPAP    Sleep apnea    cpap   Subarachnoid hematoma (HCC)    Subdural hematoma (HCC)    Testosterone deficiency    Past Surgical History:  Procedure Laterality Date   COLONOSCOPY     ESOPHAGOGASTRODUODENOSCOPY     TONSILLECTOMY AND ADENOIDECTOMY  1960   Patient Active Problem List   Diagnosis Date Noted   Chronic neck pain 02/04/2021   Alcohol dependence (HCC) 02/04/2021   Elevated blood pressure reading 01/19/2021   Obesity (BMI 30-39.9) 01/19/2021   Abnormal gait 12/24/2020   Bilateral lower extremity edema 12/24/2020   Thrombocytopenia (HCC) 09/08/2020   LFT elevation    Hypothyroidism 02/13/2019   Hx of adenomatous polyp of colon 04/12/2017   Anxiety and depression 03/01/2017   Erectile dysfunction 12/13/2016   Preventative health care 12/13/2016   Hiccups 06/10/2016   Chronic gout without tophus 04/20/2016   Genital herpes simplex 04/20/2016   Testosterone deficiency 04/20/2016   CKD (chronic kidney disease) stage 3, GFR 30-59 ml/min (HCC) 04/20/2016   Essential hypertension 04/20/2016   Hyperlipidemia  04/20/2016    PCP: Doreene Nest, NP  REFERRING PROVIDER: Elinor Parkinson, DPM  REFERRING DIAG: [phone] (ICD-10-CM) - Tear of tibialis anterior tendon  THERAPY DIAG:  Pain in left ankle and joints of left foot  Muscle weakness (generalized)  Difficulty in walking, not elsewhere classified  Rationale for Evaluation and Treatment: Rehabilitation  ONSET DATE: DOS 10/15/22 LT FOOT PRIMARY REPAIR TIBIALIS ANTERIOR TENDON W/GRAFT W/CAST APPLICATION   SUBJECTIVE:   SUBJECTIVE STATEMENT: 12/07/2022 Pt state no pain hasn't been using crutches or cane but brought them in.  Eval: States he doesn't have any pain. States he has difficulties donning compression sock. Been good about non -weightbearing on his left leg. States that overall he feels he is doing well. Reports he originally hurt his foot 50 some years ago and he slipped on a side walk and broke his ankle and then wear and tear resulted in the tear. States prior to the surgery he was having difficulty with walking and he felt like his foot would just flop down.    PERTINENT HISTORY: HTN - controlled with medication PAIN:  Are you having pain? Yes: NPRS scale: minima/10 Pain location: front of ankle  Pain description: stiffness Aggravating factors: motion Relieving factors: motion  PRECAUTIONS: None I am going to have him start  with his partial weightbearing this week progressing to full weightbearing by the first of next week and then I will follow-up with him in 2 weeks.   RED FLAGS: None   WEIGHT BEARING RESTRICTIONS: Yes NWB in boot  FALLS:  Has patient fallen in last 6 months? Yes. Number of falls 1 time a few weeks back while in case  LIVING ENVIRONMENT: Lives with: lives with their family Lives in: House/apartment Stairs: Yes: Internal: 12 steps; on left going up Has following equipment at home: Crutches and scooter  OCCUPATION: retired  PLOF: Independent  PATIENT GOALS: to be able to walk again and get  back to function   NEXT MD VISIT: 11/30/22  OBJECTIVE:    PATIENT SURVEYS:  FOTO:  Initial : 45.2    COGNITION: Overall cognitive status: Within functional limits for tasks assessed     SENSATION: Not tested  EDEMA/Observation: Circumferential: at ankle L 29.0 cm, right 27.75 cm -2 small wounds on bottom of left foot-clean but patient unaware -Main surgical incision still healing with no signs of infection but a couple of spots are still scabbed over  -2 portal sites on either side of lower leg with stitches still intact-irritation noted around the portal sites Foot blue/purple initially when in dependent position - improved to pink after elevation and edema management     PALPATION: No tenderness to palpation along Forefoot    LE Measurements Lower Extremity Right EVAL Left EVAL   A/PROM MMT A/PROM MMT  Hip Flexion      Hip Extension      Hip Abduction      Hip Adduction      Hip Internal rotation      Hip External rotation      Knee Flexion      Knee Extension      Ankle Dorsiflexion      Ankle Plantarflexion      Ankle Inversion      Ankle Eversion       (Blank rows = not tested) * pain   LOWER EXTREMITY SPECIAL TESTS:  Negative Homans' sign  FUNCTIONAL TESTS:  Able to transition from sit to stand and stand to sit and from scooter to sitting independently and no pain  GAIT: Uses scooter for left leg and propels himself forward unable to walk as he is nonweightbearing on left lower extremity   TODAY'S TREATMENT:                                                                                                                              DATE:   12/07/2022  Therapeutic Exercise:  Aerobic: Supine:  L Ankle/foot PROM all directions 6 minutes Prone:  Seated:  LAQS 3X5 5" HOLDS B, STS from  22 inch height 2x15 slow and controlled in boot, DF in boot isometric - light resistance 2x10 L 5" holds   Standing: in boot: SLS 2 hand support x3 10" holds L, L  ham  curls x10, B hip abd 2x10, lateral stepping x10 B Neuromuscular Re-education: Manual Therapy: STM/edema mobilization and scar mobilization- L 8 minutes Therapeutic Activity: Self Care:  Trigger Point Dry Needling:  Modalities:    PATIENT EDUCATION:  Education details: on HEP Person educated: Patient Education method: Explanation, Demonstration, and Handouts Education comprehension: verbalized understanding   HOME EXERCISE PROGRAM: [id number]  ASSESSMENT:  CLINICAL IMPRESSION: 12/07/2022 Able to be WBAT in boot without pain or difficulties. Slight red spot along left shin, educated patient in increasing air in boot to reduce friction along shin bone. No pian noted during session. Added new exercises to HEP but discussed waiting 24 hours to see if he has any post workout soreness. Discussed discontinuing cane at home secondary to good balance, WBAT and no pain. Will contiinue with current POC as tolerated.   Eval: Patient presents today after under going left tibialis anterior surgical repair on 10/15/2022.  Patient presents with significant swelling throughout lower extremity and incisions still healing.  Noted that patient has 2 small wounds on the ball of the foot and moisture between toes.  Session focused on education of wound care/incision care as well as lower extremity management.  Stressed continued focus of edema management and elevation above heart.  Discussed using compression garment but might need to transition to full size compression garment to below knee as current compression garment rests on one of his incision sites causing irritation.  Patient would greatly benefit from skilled physical therapy to improve overall function, range of motion, and ability to walk at home and in the community.  OBJECTIVE IMPAIRMENTS: Abnormal gait, decreased activity tolerance, decreased balance, decreased coordination, decreased knowledge of use of DME, decreased mobility, difficulty walking,  decreased ROM, decreased strength, increased edema, improper body mechanics, postural dysfunction, and pain.   ACTIVITY LIMITATIONS: sitting, standing, squatting, stairs, transfers, and locomotion level  PARTICIPATION LIMITATIONS: meal prep, cleaning, and community activity  PERSONAL FACTORS: Age and Fitness are also affecting patient's functional outcome.   REHAB POTENTIAL: Good  CLINICAL DECISION MAKING: Stable/uncomplicated  EVALUATION COMPLEXITY: Low   GOALS: Goals reviewed with patient? yes  SHORT TERM GOALS: Target date: 12/27/2022  Patient will be independent in self management strategies to improve quality of life and functional outcomes. Baseline: New Program Goal status: INITIAL  2.  Patient will report at least 50% improvement in overall symptoms and/or function to demonstrate improved functional mobility Baseline: 0% better Goal status: INITIAL  3.  Patient's surgical incisions and wounds on bottom of foot will be completely healed to reduce risk of infection. Baseline: Current open wounds on bottom of foot Goal status: INITIAL  4.  Per MD protocol patient will be able to stand on left lower extremity for at least 30 seconds without pain to demonstrate improved weightbearing tolerance to left lower leg. Baseline: Currently unable and restricted Goal status: INITIAL    LONG TERM GOALS: Target date: 01/31/2023   Patient will report at least 75% improvement in overall symptoms and/or function to demonstrate improved functional mobility Baseline: 0% better Goal status: INITIAL  2.  Patient will improve score on FOTO outcomes measure to projected score to demonstrate overall improved function and QOL Baseline: see above Goal status: INITIAL  3.  Per MD protocol patient will be able to ambulate in regular shoe without severe pain for at least 10 minutes to improve ability to walk around home and community. Baseline: unable Goal status: INITIAL  4.  Patient will  be able to a  send and descend stairs in home with step through gait and use of railing as needed to improve ability to go up and down the stairs in his own home Baseline: Unable Goal status: INITIAL    PLAN:  PT FREQUENCY: 2x/week  PT DURATION: 10 weeks  PLANNED INTERVENTIONS: Therapeutic exercises, Therapeutic activity, Neuromuscular re-education, Balance training, Gait training, Patient/Family education, Self Care, Joint mobilization, Joint manipulation, Stair training, Vestibular training, Canalith repositioning, Orthotic/Fit training, Prosthetic training, DME instructions, Aquatic Therapy, Dry Needling, Electrical stimulation, Spinal manipulation, Spinal mobilization, Cryotherapy, Moist heat, Taping, Traction, Ultrasound, Ionotophoresis 4mg /ml Dexamethasone, Manual therapy, and Re-evaluation.   PLAN FOR NEXT SESSION:  WBAT then full WB in boot - ROM in foot and strengthening.   3:14 PM, 12/07/22 Tereasa Coop, DPT Physical Therapy with [facility]

## 2022-12-09 ENCOUNTER — Ambulatory Visit: Payer: Medicare HMO | Admitting: Physical Therapy

## 2022-12-09 ENCOUNTER — Encounter: Payer: Self-pay | Admitting: Physical Therapy

## 2022-12-09 DIAGNOSIS — R262 Difficulty in walking, not elsewhere classified: Secondary | ICD-10-CM

## 2022-12-09 DIAGNOSIS — M6281 Muscle weakness (generalized): Secondary | ICD-10-CM | POA: Diagnosis not present

## 2022-12-09 DIAGNOSIS — M25572 Pain in left ankle and joints of left foot: Secondary | ICD-10-CM

## 2022-12-09 NOTE — Therapy (Signed)
OUTPATIENT PHYSICAL THERAPY LOWER EXTREMITY TREATMENT    Patient Name: Devin Howell MRN: [id number] DOB:1951/01/06, 72 y.o., male Today's Date: 12/09/2022  END OF SESSION:  PT End of Session - 12/09/22 1014     Visit Number 5    Number of Visits 20    Date for PT Re-Evaluation 01/31/23    PT Start Time 1015    PT Stop Time 1055    PT Time Calculation (min) 40 min    Activity Tolerance Patient tolerated treatment well    Behavior During Therapy [facility] for tasks assessed/performed             Past Medical History:  Diagnosis Date   AKI (acute kidney injury) (HCC) 08/15/2020   Cataract    Chronic gout    CKD (chronic kidney disease)    pt. denies   Erectile dysfunction    GERD (gastroesophageal reflux disease)    Herpes simplex    Hx of adenomatous polyp of colon 04/12/2017   Hyperlipidemia    pt. denies   Hypertension    pt. denies   OSA on CPAP    Sleep apnea    cpap   Subarachnoid hematoma (HCC)    Subdural hematoma (HCC)    Testosterone deficiency    Past Surgical History:  Procedure Laterality Date   COLONOSCOPY     ESOPHAGOGASTRODUODENOSCOPY     TONSILLECTOMY AND ADENOIDECTOMY  1960   Patient Active Problem List   Diagnosis Date Noted   Chronic neck pain 02/04/2021   Alcohol dependence (HCC) 02/04/2021   Elevated blood pressure reading 01/19/2021   Obesity (BMI 30-39.9) 01/19/2021   Abnormal gait 12/24/2020   Bilateral lower extremity edema 12/24/2020   Thrombocytopenia (HCC) 09/08/2020   LFT elevation    Hypothyroidism 02/13/2019   Hx of adenomatous polyp of colon 04/12/2017   Anxiety and depression 03/01/2017   Erectile dysfunction 12/13/2016   Preventative health care 12/13/2016   Hiccups 06/10/2016   Chronic gout without tophus 04/20/2016   Genital herpes simplex 04/20/2016   Testosterone deficiency 04/20/2016   CKD (chronic kidney disease) stage 3, GFR 30-59 ml/min (HCC) 04/20/2016   Essential hypertension 04/20/2016   Hyperlipidemia  04/20/2016    PCP: Doreene Nest, NP  REFERRING PROVIDER: Elinor Parkinson, DPM  REFERRING DIAG: [phone] (ICD-10-CM) - Tear of tibialis anterior tendon  THERAPY DIAG:  Pain in left ankle and joints of left foot  Muscle weakness (generalized)  Difficulty in walking, not elsewhere classified  Rationale for Evaluation and Treatment: Rehabilitation  ONSET DATE: DOS 10/15/22 LT FOOT PRIMARY REPAIR TIBIALIS ANTERIOR TENDON W/GRAFT W/CAST APPLICATION   SUBJECTIVE:   SUBJECTIVE STATEMENT: 12/09/2022 States he was sore in his leg after last session. Reports soreness is resolved.   Eval: States he doesn't have any pain. States he has difficulties donning compression sock. Been good about non -weightbearing on his left leg. States that overall he feels he is doing well. Reports he originally hurt his foot 50 some years ago and he slipped on a side walk and broke his ankle and then wear and tear resulted in the tear. States prior to the surgery he was having difficulty with walking and he felt like his foot would just flop down.    PERTINENT HISTORY: HTN - controlled with medication PAIN:  Are you having pain? Yes: NPRS scale: minima/10 Pain location: front of ankle  Pain description: stiffness Aggravating factors: motion Relieving factors: motion  PRECAUTIONS: None I am going to have him  start with his partial weightbearing this week progressing to full weightbearing by the first of next week and then I will follow-up with him in 2 weeks.   RED FLAGS: None   WEIGHT BEARING RESTRICTIONS: Yes NWB in boot  FALLS:  Has patient fallen in last 6 months? Yes. Number of falls 1 time a few weeks back while in case  LIVING ENVIRONMENT: Lives with: lives with their family Lives in: House/apartment Stairs: Yes: Internal: 12 steps; on left going up Has following equipment at home: Crutches and scooter  OCCUPATION: retired  PLOF: Independent  PATIENT GOALS: to be able to walk again  and get back to function   NEXT MD VISIT: 11/30/22  OBJECTIVE:    PATIENT SURVEYS:  FOTO:  Initial : 45.2    COGNITION: Overall cognitive status: Within functional limits for tasks assessed     SENSATION: Not tested  EDEMA/Observation: Circumferential: at ankle L 29.0 cm, right 27.75 cm -2 small wounds on bottom of left foot-clean but patient unaware -Main surgical incision still healing with no signs of infection but a couple of spots are still scabbed over  -2 portal sites on either side of lower leg with stitches still intact-irritation noted around the portal sites Foot blue/purple initially when in dependent position - improved to pink after elevation and edema management     PALPATION: No tenderness to palpation along Forefoot    LE Measurements Lower Extremity Right EVAL Left EVAL   A/PROM MMT A/PROM MMT  Hip Flexion      Hip Extension      Hip Abduction      Hip Adduction      Hip Internal rotation      Hip External rotation      Knee Flexion      Knee Extension      Ankle Dorsiflexion      Ankle Plantarflexion      Ankle Inversion      Ankle Eversion       (Blank rows = not tested) * pain   LOWER EXTREMITY SPECIAL TESTS:  Negative Homans' sign  FUNCTIONAL TESTS:  Able to transition from sit to stand and stand to sit and from scooter to sitting independently and no pain  GAIT: Uses scooter for left leg and propels himself forward unable to walk as he is nonweightbearing on left lower extremity   TODAY'S TREATMENT:                                                                                                                              DATE:   12/09/2022  Therapeutic Exercise:  Aerobic: Supine:  L Ankle/foot PROM all directions 6 minutes, SAQs 2 minutes, ankle pumps 2 minutes, SLR 4x5 5" hlds L s/l hip abd L 4x5 Prone: hip extension 4x5 B, hamstring curl 4x5 5" hlds B   Seated:   Standing:  Neuromuscular Re-education: Manual Therapy:  STM/edema mobilization and scar mobilization-  L 15 minutes Therapeutic Activity: Self Care:  Trigger Point Dry Needling:  Modalities:    PATIENT EDUCATION:  Education details: on HEP Person educated: Patient Education method: Explanation, Demonstration, and Handouts Education comprehension: verbalized understanding   HOME EXERCISE PROGRAM: [id number]  ASSESSMENT:  CLINICAL IMPRESSION: 12/09/2022 Focused on table exercises today to advance HEP. Tolerated all interventions well with no pain or soreness. Discussed adding in lower reps of WB exercises tomorrow. Reduced swelling noted overall. Portal sites still healing, educated patient in basic wound care for improved healing at this sites (keeping it covered and clean). Will continue with current POC as tolerated.   Eval: Patient presents today after under going left tibialis anterior surgical repair on 10/15/2022.  Patient presents with significant swelling throughout lower extremity and incisions still healing.  Noted that patient has 2 small wounds on the ball of the foot and moisture between toes.  Session focused on education of wound care/incision care as well as lower extremity management.  Stressed continued focus of edema management and elevation above heart.  Discussed using compression garment but might need to transition to full size compression garment to below knee as current compression garment rests on one of his incision sites causing irritation.  Patient would greatly benefit from skilled physical therapy to improve overall function, range of motion, and ability to walk at home and in the community.  OBJECTIVE IMPAIRMENTS: Abnormal gait, decreased activity tolerance, decreased balance, decreased coordination, decreased knowledge of use of DME, decreased mobility, difficulty walking, decreased ROM, decreased strength, increased edema, improper body mechanics, postural dysfunction, and pain.   ACTIVITY LIMITATIONS: sitting,  standing, squatting, stairs, transfers, and locomotion level  PARTICIPATION LIMITATIONS: meal prep, cleaning, and community activity  PERSONAL FACTORS: Age and Fitness are also affecting patient's functional outcome.   REHAB POTENTIAL: Good  CLINICAL DECISION MAKING: Stable/uncomplicated  EVALUATION COMPLEXITY: Low   GOALS: Goals reviewed with patient? yes  SHORT TERM GOALS: Target date: 12/27/2022  Patient will be independent in self management strategies to improve quality of life and functional outcomes. Baseline: New Program Goal status: INITIAL  2.  Patient will report at least 50% improvement in overall symptoms and/or function to demonstrate improved functional mobility Baseline: 0% better Goal status: INITIAL  3.  Patient's surgical incisions and wounds on bottom of foot will be completely healed to reduce risk of infection. Baseline: Current open wounds on bottom of foot Goal status: INITIAL  4.  Per MD protocol patient will be able to stand on left lower extremity for at least 30 seconds without pain to demonstrate improved weightbearing tolerance to left lower leg. Baseline: Currently unable and restricted Goal status: INITIAL    LONG TERM GOALS: Target date: 01/31/2023   Patient will report at least 75% improvement in overall symptoms and/or function to demonstrate improved functional mobility Baseline: 0% better Goal status: INITIAL  2.  Patient will improve score on FOTO outcomes measure to projected score to demonstrate overall improved function and QOL Baseline: see above Goal status: INITIAL  3.  Per MD protocol patient will be able to ambulate in regular shoe without severe pain for at least 10 minutes to improve ability to walk around home and community. Baseline: unable Goal status: INITIAL  4.  Patient will be able to a send and descend stairs in home with step through gait and use of railing as needed to improve ability to go up and down the stairs  in his own home Baseline: Unable Goal status: INITIAL  PLAN:  PT FREQUENCY: 2x/week  PT DURATION: 10 weeks  PLANNED INTERVENTIONS: Therapeutic exercises, Therapeutic activity, Neuromuscular re-education, Balance training, Gait training, Patient/Family education, Self Care, Joint mobilization, Joint manipulation, Stair training, Vestibular training, Canalith repositioning, Orthotic/Fit training, Prosthetic training, DME instructions, Aquatic Therapy, Dry Needling, Electrical stimulation, Spinal manipulation, Spinal mobilization, Cryotherapy, Moist heat, Taping, Traction, Ultrasound, Ionotophoresis 4mg /ml Dexamethasone, Manual therapy, and Re-evaluation.   PLAN FOR NEXT SESSION:  WBAT then full WB in boot - ROM in foot and strengthening.   11:00 AM, 12/09/22 Tereasa Coop, DPT Physical Therapy with [place]

## 2022-12-14 ENCOUNTER — Ambulatory Visit: Payer: Medicare HMO | Admitting: Physical Therapy

## 2022-12-14 ENCOUNTER — Encounter: Payer: Self-pay | Admitting: Physical Therapy

## 2022-12-14 DIAGNOSIS — M6281 Muscle weakness (generalized): Secondary | ICD-10-CM

## 2022-12-14 DIAGNOSIS — M25572 Pain in left ankle and joints of left foot: Secondary | ICD-10-CM

## 2022-12-14 DIAGNOSIS — R262 Difficulty in walking, not elsewhere classified: Secondary | ICD-10-CM

## 2022-12-14 DIAGNOSIS — G4733 Obstructive sleep apnea (adult) (pediatric): Secondary | ICD-10-CM | POA: Diagnosis not present

## 2022-12-14 NOTE — Therapy (Signed)
OUTPATIENT PHYSICAL THERAPY LOWER EXTREMITY TREATMENT    Patient Name: Devin Howell MRN: [id number] DOB:25-Mar-1951, 72 y.o., male Today's Date: 12/14/2022  END OF SESSION:  PT End of Session - 12/14/22 1231     Visit Number 6    Number of Visits 20    Date for PT Re-Evaluation 01/31/23    PT Start Time 1231    PT Stop Time 1310    PT Time Calculation (min) 39 min    Activity Tolerance Patient tolerated treatment well    Behavior During Therapy [facility] for tasks assessed/performed             Past Medical History:  Diagnosis Date   AKI (acute kidney injury) (HCC) 08/15/2020   Cataract    Chronic gout    CKD (chronic kidney disease)    pt. denies   Erectile dysfunction    GERD (gastroesophageal reflux disease)    Herpes simplex    Hx of adenomatous polyp of colon 04/12/2017   Hyperlipidemia    pt. denies   Hypertension    pt. denies   OSA on CPAP    Sleep apnea    cpap   Subarachnoid hematoma (HCC)    Subdural hematoma (HCC)    Testosterone deficiency    Past Surgical History:  Procedure Laterality Date   COLONOSCOPY     ESOPHAGOGASTRODUODENOSCOPY     TONSILLECTOMY AND ADENOIDECTOMY  1960   Patient Active Problem List   Diagnosis Date Noted   Chronic neck pain 02/04/2021   Alcohol dependence (HCC) 02/04/2021   Elevated blood pressure reading 01/19/2021   Obesity (BMI 30-39.9) 01/19/2021   Abnormal gait 12/24/2020   Bilateral lower extremity edema 12/24/2020   Thrombocytopenia (HCC) 09/08/2020   LFT elevation    Hypothyroidism 02/13/2019   Hx of adenomatous polyp of colon 04/12/2017   Anxiety and depression 03/01/2017   Erectile dysfunction 12/13/2016   Preventative health care 12/13/2016   Hiccups 06/10/2016   Chronic gout without tophus 04/20/2016   Genital herpes simplex 04/20/2016   Testosterone deficiency 04/20/2016   CKD (chronic kidney disease) stage 3, GFR 30-59 ml/min (HCC) 04/20/2016   Essential hypertension 04/20/2016   Hyperlipidemia  04/20/2016    PCP: Doreene Nest, NP  REFERRING PROVIDER: Elinor Parkinson, DPM  REFERRING DIAG: [id number] (ICD-10-CM) - Tear of tibialis anterior tendon  THERAPY DIAG:  Pain in left ankle and joints of left foot  Muscle weakness (generalized)  Difficulty in walking, not elsewhere classified  Rationale for Evaluation and Treatment: Rehabilitation  ONSET DATE: DOS 10/15/22 LT FOOT PRIMARY REPAIR TIBIALIS ANTERIOR TENDON W/GRAFT W/CAST APPLICATION   SUBJECTIVE:   SUBJECTIVE STATEMENT: 12/14/2022 States he  added standing exercises and doing well with everything is going well   Eval: States he doesn't have any pain. States he has difficulties donning compression sock. Been good about non -weightbearing on his left leg. States that overall he feels he is doing well. Reports he originally hurt his foot 50 some years ago and he slipped on a side walk and broke his ankle and then wear and tear resulted in the tear. States prior to the surgery he was having difficulty with walking and he felt like his foot would just flop down.    PERTINENT HISTORY: HTN - controlled with medication PAIN:  Are you having pain? Yes: NPRS scale: minima/10 Pain location: front of ankle  Pain description: stiffness Aggravating factors: motion Relieving factors: motion  PRECAUTIONS: None I am going to have him  start with his partial weightbearing this week progressing to full weightbearing by the first of next week and then I will follow-up with him in 2 weeks.   RED FLAGS: None   WEIGHT BEARING RESTRICTIONS: Yes NWB in boot  FALLS:  Has patient fallen in last 6 months? Yes. Number of falls 1 time a few weeks back while in case  LIVING ENVIRONMENT: Lives with: lives with their family Lives in: House/apartment Stairs: Yes: Internal: 12 steps; on left going up Has following equipment at home: Crutches and scooter  OCCUPATION: retired  PLOF: Independent  PATIENT GOALS: to be able to walk again  and get back to function   NEXT MD VISIT: 11/30/22  OBJECTIVE:    PATIENT SURVEYS:  FOTO:  Initial : 45.2    COGNITION: Overall cognitive status: Within functional limits for tasks assessed     SENSATION: Not tested  EDEMA/Observation: Circumferential: at ankle L 29.0 cm, right 27.75 cm -2 small wounds on bottom of left foot-clean but patient unaware -Main surgical incision still healing with no signs of infection but a couple of spots are still scabbed over  -2 portal sites on either side of lower leg with stitches still intact-irritation noted around the portal sites Foot blue/purple initially when in dependent position - improved to pink after elevation and edema management     PALPATION: No tenderness to palpation along Forefoot    LE Measurements Lower Extremity Right EVAL Left EVAL   A/PROM MMT A/PROM MMT  Hip Flexion      Hip Extension      Hip Abduction      Hip Adduction      Hip Internal rotation      Hip External rotation      Knee Flexion      Knee Extension      Ankle Dorsiflexion      Ankle Plantarflexion      Ankle Inversion      Ankle Eversion       (Blank rows = not tested) * pain   LOWER EXTREMITY SPECIAL TESTS:  Negative Homans' sign  FUNCTIONAL TESTS:  Able to transition from sit to stand and stand to sit and from scooter to sitting independently and no pain  GAIT: Uses scooter for left leg and propels himself forward unable to walk as he is nonweightbearing on left lower extremity   TODAY'S TREATMENT:                                                                                                                              DATE:   12/14/2022  Therapeutic Exercise:  Aerobic: Supine:  L Ankle/foot PROM all directions 5 minutes,  calf stretch with strap x3 30" holds L, SLR L 3x5 5" holds     Seated: DF without toe extension 2x15 L 5" holds, PF 2x15 L 5" holds, toe splaying L 3 minutes   Standing: SLS 2 hand support left  x3 30"  hlds, hamstring curls 2x10 L  Neuromuscular Re-education: Manual Therapy: STM/edema mobilization and scar mobilization- L 10 minutes Therapeutic Activity: Self Care:  Trigger Point Dry Needling:  Modalities:    PATIENT EDUCATION:  Education details: on HEP Person educated: Patient Education method: Explanation, Demonstration, and Handouts Education comprehension: verbalized understanding   HOME EXERCISE PROGRAM: [id number]  ASSESSMENT:  CLINICAL IMPRESSION: 12/14/2022 Overall patient doing well. No pain or soreness noted with or after exercises. Added calf stretch to HEP. Patient to f/u with MD next session. Will continue with current POC and f/u with MD recommendations based on next visit.  Eval: Patient presents today after under going left tibialis anterior surgical repair on 10/15/2022.  Patient presents with significant swelling throughout lower extremity and incisions still healing.  Noted that patient has 2 small wounds on the ball of the foot and moisture between toes.  Session focused on education of wound care/incision care as well as lower extremity management.  Stressed continued focus of edema management and elevation above heart.  Discussed using compression garment but might need to transition to full size compression garment to below knee as current compression garment rests on one of his incision sites causing irritation.  Patient would greatly benefit from skilled physical therapy to improve overall function, range of motion, and ability to walk at home and in the community.  OBJECTIVE IMPAIRMENTS: Abnormal gait, decreased activity tolerance, decreased balance, decreased coordination, decreased knowledge of use of DME, decreased mobility, difficulty walking, decreased ROM, decreased strength, increased edema, improper body mechanics, postural dysfunction, and pain.   ACTIVITY LIMITATIONS: sitting, standing, squatting, stairs, transfers, and locomotion level  PARTICIPATION  LIMITATIONS: meal prep, cleaning, and community activity  PERSONAL FACTORS: Age and Fitness are also affecting patient's functional outcome.   REHAB POTENTIAL: Good  CLINICAL DECISION MAKING: Stable/uncomplicated  EVALUATION COMPLEXITY: Low   GOALS: Goals reviewed with patient? yes  SHORT TERM GOALS: Target date: 12/27/2022  Patient will be independent in self management strategies to improve quality of life and functional outcomes. Baseline: New Program Goal status: INITIAL  2.  Patient will report at least 50% improvement in overall symptoms and/or function to demonstrate improved functional mobility Baseline: 0% better Goal status: INITIAL  3.  Patient's surgical incisions and wounds on bottom of foot will be completely healed to reduce risk of infection. Baseline: Current open wounds on bottom of foot Goal status: INITIAL  4.  Per MD protocol patient will be able to stand on left lower extremity for at least 30 seconds without pain to demonstrate improved weightbearing tolerance to left lower leg. Baseline: Currently unable and restricted Goal status: INITIAL    LONG TERM GOALS: Target date: 01/31/2023   Patient will report at least 75% improvement in overall symptoms and/or function to demonstrate improved functional mobility Baseline: 0% better Goal status: INITIAL  2.  Patient will improve score on FOTO outcomes measure to projected score to demonstrate overall improved function and QOL Baseline: see above Goal status: INITIAL  3.  Per MD protocol patient will be able to ambulate in regular shoe without severe pain for at least 10 minutes to improve ability to walk around home and community. Baseline: unable Goal status: INITIAL  4.  Patient will be able to a send and descend stairs in home with step through gait and use of railing as needed to improve ability to go up and down the stairs in his own home Baseline: Unable Goal status: INITIAL    PLAN:  PT  FREQUENCY: 2x/week  PT DURATION: 10 weeks  PLANNED INTERVENTIONS: Therapeutic exercises, Therapeutic activity, Neuromuscular re-education, Balance training, Gait training, Patient/Family education, Self Care, Joint mobilization, Joint manipulation, Stair training, Vestibular training, Canalith repositioning, Orthotic/Fit training, Prosthetic training, DME instructions, Aquatic Therapy, Dry Needling, Electrical stimulation, Spinal manipulation, Spinal mobilization, Cryotherapy, Moist heat, Taping, Traction, Ultrasound, Ionotophoresis 4mg /ml Dexamethasone, Manual therapy, and Re-evaluation.   PLAN FOR NEXT SESSION:  WBAT then full WB in boot - ROM in foot and strengthening.   1:13 PM, 12/14/22 Tereasa Coop, DPT Physical Therapy with [facility]

## 2022-12-16 ENCOUNTER — Ambulatory Visit: Payer: Medicare HMO | Admitting: Podiatry

## 2022-12-16 ENCOUNTER — Ambulatory Visit: Payer: Medicare HMO | Admitting: Physical Therapy

## 2022-12-16 ENCOUNTER — Encounter: Payer: Self-pay | Admitting: Podiatry

## 2022-12-16 ENCOUNTER — Encounter: Payer: Self-pay | Admitting: Physical Therapy

## 2022-12-16 DIAGNOSIS — R262 Difficulty in walking, not elsewhere classified: Secondary | ICD-10-CM

## 2022-12-16 DIAGNOSIS — Z9889 Other specified postprocedural states: Secondary | ICD-10-CM

## 2022-12-16 DIAGNOSIS — M25572 Pain in left ankle and joints of left foot: Secondary | ICD-10-CM

## 2022-12-16 DIAGNOSIS — M6281 Muscle weakness (generalized): Secondary | ICD-10-CM

## 2022-12-16 DIAGNOSIS — S86219A Strain of muscle(s) and tendon(s) of anterior muscle group at lower leg level, unspecified leg, initial encounter: Secondary | ICD-10-CM

## 2022-12-16 NOTE — Therapy (Signed)
OUTPATIENT PHYSICAL THERAPY LOWER EXTREMITY TREATMENT    Patient Name: Devin Howell MRN: [id number] DOB:[birth year], 72 y.o., male Today's Date: 12/16/2022  END OF SESSION:  PT End of Session - 12/16/22 1427     Visit Number 7    Number of Visits 20    Date for PT Re-Evaluation 01/31/23    PT Start Time 1430    PT Stop Time 1510    PT Time Calculation (min) 40 min    Activity Tolerance Patient tolerated treatment well    Behavior During Therapy [facility] for tasks assessed/performed             Past Medical History:  Diagnosis Date   AKI (acute kidney injury) (HCC) 08/15/2020   Cataract    Chronic gout    CKD (chronic kidney disease)    pt. denies   Erectile dysfunction    GERD (gastroesophageal reflux disease)    Herpes simplex    Hx of adenomatous polyp of colon 04/12/2017   Hyperlipidemia    pt. denies   Hypertension    pt. denies   OSA on CPAP    Sleep apnea    cpap   Subarachnoid hematoma (HCC)    Subdural hematoma (HCC)    Testosterone deficiency    Past Surgical History:  Procedure Laterality Date   COLONOSCOPY     ESOPHAGOGASTRODUODENOSCOPY     TONSILLECTOMY AND ADENOIDECTOMY  1960   Patient Active Problem List   Diagnosis Date Noted   Chronic neck pain 02/04/2021   Alcohol dependence (HCC) 02/04/2021   Elevated blood pressure reading 01/19/2021   Obesity (BMI 30-39.9) 01/19/2021   Abnormal gait 12/24/2020   Bilateral lower extremity edema 12/24/2020   Thrombocytopenia (HCC) 09/08/2020   LFT elevation    Hypothyroidism 02/13/2019   Hx of adenomatous polyp of colon 04/12/2017   Anxiety and depression 03/01/2017   Erectile dysfunction 12/13/2016   Preventative health care 12/13/2016   Hiccups 06/10/2016   Chronic gout without tophus 04/20/2016   Genital herpes simplex 04/20/2016   Testosterone deficiency 04/20/2016   CKD (chronic kidney disease) stage 3, GFR 30-59 ml/min (HCC) 04/20/2016   Essential hypertension 04/20/2016   Hyperlipidemia  04/20/2016    PCP: Doreene Nest, NP  REFERRING PROVIDER: Elinor Parkinson, DPM  REFERRING DIAG: [id number] (ICD-10-CM) - Tear of tibialis anterior tendon  THERAPY DIAG:  Pain in left ankle and joints of left foot  Muscle weakness (generalized)  Difficulty in walking, not elsewhere classified  Rationale for Evaluation and Treatment: Rehabilitation  ONSET DATE: DOS 10/15/22 LT FOOT PRIMARY REPAIR TIBIALIS ANTERIOR TENDON W/GRAFT W/CAST APPLICATION   SUBJECTIVE:   SUBJECTIVE STATEMENT: 12/16/2022 States MD says healing well and to will f/u in 2 weeks to wean from the boot. Able to get out of the boot a little bit around the house.   Eval: States he doesn't have any pain. States he has difficulties donning compression sock. Been good about non -weightbearing on his left leg. States that overall he feels he is doing well. Reports he originally hurt his foot 50 some years ago and he slipped on a side walk and broke his ankle and then wear and tear resulted in the tear. States prior to the surgery he was having difficulty with walking and he felt like his foot would just flop down.    PERTINENT HISTORY: HTN - controlled with medication PAIN:  Are you having pain? Yes: NPRS scale: 0/10 Pain location: front of ankle  Pain description:  stiffness Aggravating factors: motion Relieving factors: motion  PRECAUTIONS: None I am going to have him start with his partial weightbearing this week progressing to full weightbearing by the first of next week and then I will follow-up with him in 2 weeks.   RED FLAGS: None   WEIGHT BEARING RESTRICTIONS: Yes NWB in boot  FALLS:  Has patient fallen in last 6 months? Yes. Number of falls 1 time a few weeks back while in case  LIVING ENVIRONMENT: Lives with: lives with their family Lives in: House/apartment Stairs: Yes: Internal: 12 steps; on left going up Has following equipment at home: Crutches and scooter  OCCUPATION: retired  PLOF:  Independent  PATIENT GOALS: to be able to walk again and get back to function   NEXT MD VISIT: 11/30/22  OBJECTIVE:    PATIENT SURVEYS:  FOTO:  Initial : 45.2    COGNITION: Overall cognitive status: Within functional limits for tasks assessed     SENSATION: Not tested  EDEMA/Observation: Circumferential: at ankle L 29.0 cm, right 27.75 cm -2 small wounds on bottom of left foot-clean but patient unaware -Main surgical incision still healing with no signs of infection but a couple of spots are still scabbed over  -2 portal sites on either side of lower leg with stitches still intact-irritation noted around the portal sites Foot blue/purple initially when in dependent position - improved to pink after elevation and edema management     PALPATION: No tenderness to palpation along Forefoot    LE Measurements Lower Extremity Right EVAL Left EVAL   A/PROM MMT A/PROM MMT  Hip Flexion      Hip Extension      Hip Abduction      Hip Adduction      Hip Internal rotation      Hip External rotation      Knee Flexion      Knee Extension      Ankle Dorsiflexion      Ankle Plantarflexion      Ankle Inversion      Ankle Eversion       (Blank rows = not tested) * pain   LOWER EXTREMITY SPECIAL TESTS:  Negative Homans' sign  FUNCTIONAL TESTS:  Able to transition from sit to stand and stand to sit and from scooter to sitting independently and no pain  GAIT: Uses scooter for left leg and propels himself forward unable to walk as he is nonweightbearing on left lower extremity   TODAY'S TREATMENT:                                                                                                                              DATE:   12/16/2022  Therapeutic Exercise:  Aerobic: Supine:    Seated: PF 3x10 L 5" holds slight resistance, DF 2x15 L 5" holds, left inversion/eversion 3x10 B, LAQs 3x5 10" holds B, STS elevated surface x5 - focus on left side  Standing: SLS 2 hand  support left x3 30" hlds, hamstring curls 3x5 L, hip abd with 2 hand support 3x5 B, marching 1 hand support 3x5 B, hip extension 3x5 5" holds B with 2 hand support   Neuromuscular Re-education: Manual Therapy:  Therapeutic Activity: Self Care:  Trigger Point Dry Needling:  Modalities:    PATIENT EDUCATION:  Education details: on HEP Person educated: Patient Education method: Explanation, Demonstration, and Handouts Education comprehension: verbalized understanding   HOME EXERCISE PROGRAM: [id number]  ASSESSMENT:  CLINICAL IMPRESSION: 12/16/2022 All standing exercises performed in boot today. Discussed bringing in shoe to initiate weight shifts in shoe in controlled environment. No pain noted during session. Cues to not lock out knee while performing standing exercises. Will continue with current POC as tolerated.   Eval: Patient presents today after under going left tibialis anterior surgical repair on 10/15/2022.  Patient presents with significant swelling throughout lower extremity and incisions still healing.  Noted that patient has 2 small wounds on the ball of the foot and moisture between toes.  Session focused on education of wound care/incision care as well as lower extremity management.  Stressed continued focus of edema management and elevation above heart.  Discussed using compression garment but might need to transition to full size compression garment to below knee as current compression garment rests on one of his incision sites causing irritation.  Patient would greatly benefit from skilled physical therapy to improve overall function, range of motion, and ability to walk at home and in the community.  OBJECTIVE IMPAIRMENTS: Abnormal gait, decreased activity tolerance, decreased balance, decreased coordination, decreased knowledge of use of DME, decreased mobility, difficulty walking, decreased ROM, decreased strength, increased edema, improper body mechanics, postural  dysfunction, and pain.   ACTIVITY LIMITATIONS: sitting, standing, squatting, stairs, transfers, and locomotion level  PARTICIPATION LIMITATIONS: meal prep, cleaning, and community activity  PERSONAL FACTORS: Age and Fitness are also affecting patient's functional outcome.   REHAB POTENTIAL: Good  CLINICAL DECISION MAKING: Stable/uncomplicated  EVALUATION COMPLEXITY: Low   GOALS: Goals reviewed with patient? yes  SHORT TERM GOALS: Target date: 12/27/2022  Patient will be independent in self management strategies to improve quality of life and functional outcomes. Baseline: New Program Goal status: INITIAL  2.  Patient will report at least 50% improvement in overall symptoms and/or function to demonstrate improved functional mobility Baseline: 0% better Goal status: INITIAL  3.  Patient's surgical incisions and wounds on bottom of foot will be completely healed to reduce risk of infection. Baseline: Current open wounds on bottom of foot Goal status: INITIAL  4.  Per MD protocol patient will be able to stand on left lower extremity for at least 30 seconds without pain to demonstrate improved weightbearing tolerance to left lower leg. Baseline: Currently unable and restricted Goal status: INITIAL    LONG TERM GOALS: Target date: 01/31/2023   Patient will report at least 75% improvement in overall symptoms and/or function to demonstrate improved functional mobility Baseline: 0% better Goal status: INITIAL  2.  Patient will improve score on FOTO outcomes measure to projected score to demonstrate overall improved function and QOL Baseline: see above Goal status: INITIAL  3.  Per MD protocol patient will be able to ambulate in regular shoe without severe pain for at least 10 minutes to improve ability to walk around home and community. Baseline: unable Goal status: INITIAL  4.  Patient will be able to a send and descend stairs in home with step through gait and  use of railing  as needed to improve ability to go up and down the stairs in his own home Baseline: Unable Goal status: INITIAL    PLAN:  PT FREQUENCY: 2x/week  PT DURATION: 10 weeks  PLANNED INTERVENTIONS: Therapeutic exercises, Therapeutic activity, Neuromuscular re-education, Balance training, Gait training, Patient/Family education, Self Care, Joint mobilization, Joint manipulation, Stair training, Vestibular training, Canalith repositioning, Orthotic/Fit training, Prosthetic training, DME instructions, Aquatic Therapy, Dry Needling, Electrical stimulation, Spinal manipulation, Spinal mobilization, Cryotherapy, Moist heat, Taping, Traction, Ultrasound, Ionotophoresis 4mg /ml Dexamethasone, Manual therapy, and Re-evaluation.   PLAN FOR NEXT SESSION:  WBAT then full WB in boot - ROM in foot and strengthening.   3:11 PM, 12/16/22 Tereasa Coop, DPT Physical Therapy with [facility]

## 2022-12-19 NOTE — Progress Notes (Signed)
He presents today date of surgery 10/15/2018 for primary repair and translocation nonweightbearing status and PRP injection.  He states that he is doing really well has had absolutely no pains however continues to wear the cam walker is still in physical therapy and states that there have been no problems at all.  He is still been nonweightbearing.  Objective: Vital signs stable oriented x 3 CAM Walker was removed there is minimal edema to the surgical leg and foot he has a well-healed scar and he has good dorsiflexion with inversion of the foot against resistance.  He does not have any bowstringing of the tendon at the ankle.  Assessment: Well-healing surgical foot and leg.  Plan: I am going to request that he stay in his cam boot for another 2 weeks and continue nonweightbearing exercises at home and in physical therapy.

## 2022-12-21 ENCOUNTER — Encounter: Payer: Self-pay | Admitting: Physical Therapy

## 2022-12-21 ENCOUNTER — Ambulatory Visit: Payer: Medicare HMO | Admitting: Physical Therapy

## 2022-12-21 DIAGNOSIS — M25572 Pain in left ankle and joints of left foot: Secondary | ICD-10-CM | POA: Diagnosis not present

## 2022-12-21 DIAGNOSIS — M6281 Muscle weakness (generalized): Secondary | ICD-10-CM | POA: Diagnosis not present

## 2022-12-21 DIAGNOSIS — R262 Difficulty in walking, not elsewhere classified: Secondary | ICD-10-CM | POA: Diagnosis not present

## 2022-12-21 NOTE — Therapy (Addendum)
OUTPATIENT PHYSICAL THERAPY LOWER EXTREMITY TREATMENT  Progress Note Reporting Period 11/22/22 to 12/21/22  See note below for Objective Data and Assessment of Progress/Goals.      Patient Name: Devin Howell MRN: [id number] DOB:[birth year], 72 y.o., male Today's Date: 12/21/2022  END OF SESSION:  PT End of Session - 12/21/22 1432     Visit Number 8    Number of Visits 20    Date for PT Re-Evaluation 01/31/23    PT Start Time 1433    PT Stop Time 1511    PT Time Calculation (min) 38 min    Activity Tolerance Patient tolerated treatment well    Behavior During Therapy [facility] for tasks assessed/performed             Past Medical History:  Diagnosis Date   AKI (acute kidney injury) (HCC) 08/15/2020   Cataract    Chronic gout    CKD (chronic kidney disease)    pt. denies   Erectile dysfunction    GERD (gastroesophageal reflux disease)    Herpes simplex    Hx of adenomatous polyp of colon 04/12/2017   Hyperlipidemia    pt. denies   Hypertension    pt. denies   OSA on CPAP    Sleep apnea    cpap   Subarachnoid hematoma (HCC)    Subdural hematoma (HCC)    Testosterone deficiency    Past Surgical History:  Procedure Laterality Date   COLONOSCOPY     ESOPHAGOGASTRODUODENOSCOPY     TONSILLECTOMY AND ADENOIDECTOMY  1960   Patient Active Problem List   Diagnosis Date Noted   Chronic neck pain 02/04/2021   Alcohol dependence (HCC) 02/04/2021   Elevated blood pressure reading 01/19/2021   Obesity (BMI 30-39.9) 01/19/2021   Abnormal gait 12/24/2020   Bilateral lower extremity edema 12/24/2020   Thrombocytopenia (HCC) 09/08/2020   LFT elevation    Hypothyroidism 02/13/2019   Hx of adenomatous polyp of colon 04/12/2017   Anxiety and depression 03/01/2017   Erectile dysfunction 12/13/2016   Preventative health care 12/13/2016   Hiccups 06/10/2016   Chronic gout without tophus 04/20/2016   Genital herpes simplex 04/20/2016   Testosterone deficiency 04/20/2016    CKD (chronic kidney disease) stage 3, GFR 30-59 ml/min (HCC) 04/20/2016   Essential hypertension 04/20/2016   Hyperlipidemia 04/20/2016    PCP: Doreene Nest, NP  REFERRING PROVIDER: Elinor Parkinson, DPM  REFERRING DIAG: [phone] (ICD-10-CM) - Tear of tibialis anterior tendon  THERAPY DIAG:  Pain in left ankle and joints of left foot  Muscle weakness (generalized)  Difficulty in walking, not elsewhere classified  Rationale for Evaluation and Treatment: Rehabilitation  ONSET DATE: DOS 10/15/22 LT FOOT PRIMARY REPAIR TIBIALIS ANTERIOR TENDON W/GRAFT W/CAST APPLICATION   SUBJECTIVE:   SUBJECTIVE STATEMENT: 12/21/2022 States he has been walking a bit around his house in the shoe and up to about 30 minutes without difficulty. Reports he feels 80% better since the start of PT  Eval: States he doesn't have any pain. States he has difficulties donning compression sock. Been good about non -weightbearing on his left leg. States that overall he feels he is doing well. Reports he originally hurt his foot 50 some years ago and he slipped on a side walk and broke his ankle and then wear and tear resulted in the tear. States prior to the surgery he was having difficulty with walking and he felt like his foot would just flop down.    PERTINENT HISTORY: HTN -  controlled with medication PAIN:  Are you having pain? Yes: NPRS scale: 0/10 Pain location: front of ankle  Pain description: stiffness Aggravating factors: motion Relieving factors: motion  PRECAUTIONS: None I am going to have him start with his partial weightbearing this week progressing to full weightbearing by the first of next week and then I will follow-up with him in 2 weeks.   RED FLAGS: None   WEIGHT BEARING RESTRICTIONS: Yes NWB in boot  FALLS:  Has patient fallen in last 6 months? Yes. Number of falls 1 time a few weeks back while in case  LIVING ENVIRONMENT: Lives with: lives with their family Lives in:  House/apartment Stairs: Yes: Internal: 12 steps; on left going up Has following equipment at home: Crutches and scooter  OCCUPATION: retired  PLOF: Independent  PATIENT GOALS: to be able to walk again and get back to function   NEXT MD VISIT: 11/30/22  OBJECTIVE:    PATIENT SURVEYS:  FOTO:  Initial : 45.2    COGNITION: Overall cognitive status: Within functional limits for tasks assessed     SENSATION: Not tested  EDEMA/Observation: Circumferential: at ankle L 29.0 cm, right 27.75 cm - incisions healed no wounds on bottom of foot     PALPATION: No tenderness to palpation along Forefoot    LE Measurements Lower Extremity Right EVAL Left EVAL   A/PROM MMT A/PROM MMT  Hip Flexion      Hip Extension      Hip Abduction      Hip Adduction      Hip Internal rotation      Hip External rotation      Knee Flexion      Knee Extension      Ankle Dorsiflexion      Ankle Plantarflexion      Ankle Inversion      Ankle Eversion       (Blank rows = not tested) * pain   LOWER EXTREMITY SPECIAL TESTS:  Negative Homans' sign  FUNCTIONAL TESTS:  Able to transition from sit to stand and stand to sit with 75% weight on R LE in shoe  GAIT: Uses boot to walk   TODAY'S TREATMENT:                                                                                                                              DATE:   12/21/2022  Therapeutic Exercise:  Objective measurements updated  Supine:    Seated: PF 2x15 L 5" holds slight resistance, DF 2x15 L 5" holds, left inversion/eversion 3x10 B, LAQs 3x5 10" holds B, calf stretch x3 30" holds L with strap    Standing: in tennis shoe - weight shift to left leg 15" holds x10, STS from elevated surface hands on thighs in tennis shoe and 40% BW on left leg 3x8 Neuromuscular Re-education: Manual Therapy:  Therapeutic Activity: Self Care:  Trigger Point Dry Needling:  Modalities:    PATIENT  EDUCATION:  Education details: on  HEP Person educated: Patient Education method: Explanation, Facilities manager, and Handouts Education comprehension: verbalized understanding   HOME EXERCISE PROGRAM: [id number]  ASSESSMENT:  CLINICAL IMPRESSION: 12/21/2022 Progress note on this date. Overall patient doing well. Added standing exercises in shoe today. Tolerated this well. Reviewed progress and reducing patient to 1x/week per progress and restrictions at this time. Advised to continue to follow protocol and not push himself too quickly as he is feeling better and likely to try more out of the boot because of this. Answered all questions and will continue with current POC as tolerated.   Eval: Patient presents today after under going left tibialis anterior surgical repair on 10/15/2022.  Patient presents with significant swelling throughout lower extremity and incisions still healing.  Noted that patient has 2 small wounds on the ball of the foot and moisture between toes.  Session focused on education of wound care/incision care as well as lower extremity management.  Stressed continued focus of edema management and elevation above heart.  Discussed using compression garment but might need to transition to full size compression garment to below knee as current compression garment rests on one of his incision sites causing irritation.  Patient would greatly benefit from skilled physical therapy to improve overall function, range of motion, and ability to walk at home and in the community.  OBJECTIVE IMPAIRMENTS: Abnormal gait, decreased activity tolerance, decreased balance, decreased coordination, decreased knowledge of use of DME, decreased mobility, difficulty walking, decreased ROM, decreased strength, increased edema, improper body mechanics, postural dysfunction, and pain.   ACTIVITY LIMITATIONS: sitting, standing, squatting, stairs, transfers, and locomotion level  PARTICIPATION LIMITATIONS: meal prep, cleaning, and community  activity  PERSONAL FACTORS: Age and Fitness are also affecting patient's functional outcome.   REHAB POTENTIAL: Good  CLINICAL DECISION MAKING: Stable/uncomplicated  EVALUATION COMPLEXITY: Low   GOALS: Goals reviewed with patient? yes  SHORT TERM GOALS: Target date: 12/27/2022  Patient will be independent in self management strategies to improve quality of life and functional outcomes. Baseline: New Program Goal status: MET  2.  Patient will report at least 50% improvement in overall symptoms and/or function to demonstrate improved functional mobility Baseline: 0% better Goal status: MET  3.  Patient's surgical incisions and wounds on bottom of foot will be completely healed to reduce risk of infection. Baseline: Current open wounds on bottom of foot Goal status: MET  4.  Per MD protocol patient will be able to stand on left lower extremity for at least 30 seconds without pain to demonstrate improved weightbearing tolerance to left lower leg. Baseline: Currently unable and restricted Goal status: PROGRESSING- can in boot not in shoe    LONG TERM GOALS: Target date: 01/31/2023   Patient will report at least 75% improvement in overall symptoms and/or function to demonstrate improved functional mobility Baseline: 0% better Goal status: MET  2.  Patient will improve score on FOTO outcomes measure to projected score to demonstrate overall improved function and QOL Baseline: see above Goal status: PROGRESSING  3.  Per MD protocol patient will be able to ambulate in regular shoe without severe pain for at least 10 minutes to improve ability to walk around home and community. Baseline: unable Goal status: PROGRESSING  4.  Patient will be able to a send and descend stairs in home with step through gait and use of railing as needed to improve ability to go up and down the stairs in his own home Baseline: Unable Goal status:  UNABLE per protocol    PLAN:  PT FREQUENCY:  2x/week  PT DURATION: 10 weeks  PLANNED INTERVENTIONS: Therapeutic exercises, Therapeutic activity, Neuromuscular re-education, Balance training, Gait training, Patient/Family education, Self Care, Joint mobilization, Joint manipulation, Stair training, Vestibular training, Canalith repositioning, Orthotic/Fit training, Prosthetic training, DME instructions, Aquatic Therapy, Dry Needling, Electrical stimulation, Spinal manipulation, Spinal mobilization, Cryotherapy, Moist heat, Taping, Traction, Ultrasound, Ionotophoresis 4mg /ml Dexamethasone, Manual therapy, and Re-evaluation.   PLAN FOR NEXT SESSION: progress WB - gentle - in shoe for static exercises and level surfaces, LE strengthening as tolerated    3:13 PM, 12/21/22 Tereasa Coop, DPT Physical Therapy with [facility]

## 2022-12-23 ENCOUNTER — Encounter: Payer: Medicare HMO | Admitting: Physical Therapy

## 2022-12-29 ENCOUNTER — Ambulatory Visit: Payer: Medicare HMO | Admitting: Physical Therapy

## 2022-12-29 ENCOUNTER — Encounter: Payer: Self-pay | Admitting: Physical Therapy

## 2022-12-29 DIAGNOSIS — R262 Difficulty in walking, not elsewhere classified: Secondary | ICD-10-CM

## 2022-12-29 DIAGNOSIS — M6281 Muscle weakness (generalized): Secondary | ICD-10-CM

## 2022-12-29 DIAGNOSIS — M25572 Pain in left ankle and joints of left foot: Secondary | ICD-10-CM | POA: Diagnosis not present

## 2022-12-29 NOTE — Therapy (Signed)
OUTPATIENT PHYSICAL THERAPY LOWER EXTREMITY TREATMENT    Patient Name: Devin Howell MRN: [id number] DOB:11/29/1950, 72 y.o., male Today's Date: 12/29/2022  END OF SESSION:  PT End of Session - 12/29/22 1430     Visit Number 9    Number of Visits 20    Date for PT Re-Evaluation 01/31/23    PT Start Time 1430    PT Stop Time 1510    PT Time Calculation (min) 40 min    Activity Tolerance Patient tolerated treatment well    Behavior During Therapy [facility] for tasks assessed/performed             Past Medical History:  Diagnosis Date   AKI (acute kidney injury) (HCC) 08/15/2020   Cataract    Chronic gout    CKD (chronic kidney disease)    pt. denies   Erectile dysfunction    GERD (gastroesophageal reflux disease)    Herpes simplex    Hx of adenomatous polyp of colon 04/12/2017   Hyperlipidemia    pt. denies   Hypertension    pt. denies   OSA on CPAP    Sleep apnea    cpap   Subarachnoid hematoma (HCC)    Subdural hematoma (HCC)    Testosterone deficiency    Past Surgical History:  Procedure Laterality Date   COLONOSCOPY     ESOPHAGOGASTRODUODENOSCOPY     TONSILLECTOMY AND ADENOIDECTOMY  1960   Patient Active Problem List   Diagnosis Date Noted   Chronic neck pain 02/04/2021   Alcohol dependence (HCC) 02/04/2021   Elevated blood pressure reading 01/19/2021   Obesity (BMI 30-39.9) 01/19/2021   Abnormal gait 12/24/2020   Bilateral lower extremity edema 12/24/2020   Thrombocytopenia (HCC) 09/08/2020   LFT elevation    Hypothyroidism 02/13/2019   Hx of adenomatous polyp of colon 04/12/2017   Anxiety and depression 03/01/2017   Erectile dysfunction 12/13/2016   Preventative health care 12/13/2016   Hiccups 06/10/2016   Chronic gout without tophus 04/20/2016   Genital herpes simplex 04/20/2016   Testosterone deficiency 04/20/2016   CKD (chronic kidney disease) stage 3, GFR 30-59 ml/min (HCC) 04/20/2016   Essential hypertension 04/20/2016   Hyperlipidemia  04/20/2016    PCP: Doreene Nest, NP  REFERRING PROVIDER: Elinor Parkinson, DPM  REFERRING DIAG: [id number] (ICD-10-CM) - Tear of tibialis anterior tendon  THERAPY DIAG:  Pain in left ankle and joints of left foot  Muscle weakness (generalized)  Difficulty in walking, not elsewhere classified  Rationale for Evaluation and Treatment: Rehabilitation  ONSET DATE: DOS 10/15/22 LT FOOT PRIMARY REPAIR TIBIALIS ANTERIOR TENDON W/GRAFT W/CAST APPLICATION   SUBJECTIVE:   SUBJECTIVE STATEMENT: 12/29/2022 No pain or discomfort and in the shoe a couple hours a day.   Eval: States he doesn't have any pain. States he has difficulties donning compression sock. Been good about non -weightbearing on his left leg. States that overall he feels he is doing well. Reports he originally hurt his foot 50 some years ago and he slipped on a side walk and broke his ankle and then wear and tear resulted in the tear. States prior to the surgery he was having difficulty with walking and he felt like his foot would just flop down.    PERTINENT HISTORY: HTN - controlled with medication PAIN:  Are you having pain? Yes: NPRS scale: 0/10 Pain location: front of ankle  Pain description: stiffness Aggravating factors: motion Relieving factors: motion  PRECAUTIONS: None I am going to have him start  with his partial weightbearing this week progressing to full weightbearing by the first of next week and then I will follow-up with him in 2 weeks.   RED FLAGS: None   WEIGHT BEARING RESTRICTIONS: Yes NWB in boot  FALLS:  Has patient fallen in last 6 months? Yes. Number of falls 1 time a few weeks back while in case  LIVING ENVIRONMENT: Lives with: lives with their family Lives in: House/apartment Stairs: Yes: Internal: 12 steps; on left going up Has following equipment at home: Crutches and scooter  OCCUPATION: retired  PLOF: Independent  PATIENT GOALS: to be able to walk again and get back to function    NEXT MD VISIT: 11/30/22  OBJECTIVE:    PATIENT SURVEYS:  FOTO:  Initial : 45.2    COGNITION: Overall cognitive status: Within functional limits for tasks assessed     SENSATION: Not tested  EDEMA/Observation: Circumferential: at ankle L 29.0 cm, right 27.75 cm - incisions healed no wounds on bottom of foot     PALPATION: No tenderness to palpation along Forefoot    LE Measurements Lower Extremity Right EVAL Left EVAL   A/PROM MMT A/PROM MMT  Hip Flexion      Hip Extension      Hip Abduction      Hip Adduction      Hip Internal rotation      Hip External rotation      Knee Flexion      Knee Extension      Ankle Dorsiflexion      Ankle Plantarflexion      Ankle Inversion      Ankle Eversion       (Blank rows = not tested) * pain   LOWER EXTREMITY SPECIAL TESTS:  Negative Homans' sign  FUNCTIONAL TESTS:  Able to transition from sit to stand and stand to sit with 75% weight on R LE in shoe  GAIT: Uses boot to walk   TODAY'S TREATMENT:                                                                                                                              DATE:   12/29/2022  Therapeutic Exercise: Supine:    Seated: PF x250L 5" holds slight resistance, DF x25 L 5" holds, left inversion/eversion 2x15 B, ankle 4 way with blue band 2x12  Each blue band   LAQs 3x5 10" holds B, calf stretch x3 30" holds L with strap    Standing: in tennis shoe - squats - mini with 2 hand support 2x12, weight shifts on blue foam with 2 hand support lateral 2 minutes, circles 2 minutes each direction, walking with counter support 5 minutes, DF with foot in front 2x12 L with counter support   Neuromuscular Re-education: Manual Therapy:  Therapeutic Activity: Self Care:  Trigger Point Dry Needling:  Modalities:    PATIENT EDUCATION:  Education details: on HEP Person educated: Patient Education method: Programmer, multimedia, Demonstration,  and Handouts Education  comprehension: verbalized understanding   HOME EXERCISE PROGRAM: [id number]  ASSESSMENT:  CLINICAL IMPRESSION: 12/29/2022 Continued to progress exercises as tolerated. Overall patient doing well and reported no pulling or discomfort during or after session. Patient to see MD tomorrow, will continue to progress through post operative rehab as indicated by surgeon. Added  band exercises to HEP and provided patient with band for adherence.   Eval: Patient presents today after under going left tibialis anterior surgical repair on 10/15/2022.  Patient presents with significant swelling throughout lower extremity and incisions still healing.  Noted that patient has 2 small wounds on the ball of the foot and moisture between toes.  Session focused on education of wound care/incision care as well as lower extremity management.  Stressed continued focus of edema management and elevation above heart.  Discussed using compression garment but might need to transition to full size compression garment to below knee as current compression garment rests on one of his incision sites causing irritation.  Patient would greatly benefit from skilled physical therapy to improve overall function, range of motion, and ability to walk at home and in the community.  OBJECTIVE IMPAIRMENTS: Abnormal gait, decreased activity tolerance, decreased balance, decreased coordination, decreased knowledge of use of DME, decreased mobility, difficulty walking, decreased ROM, decreased strength, increased edema, improper body mechanics, postural dysfunction, and pain.   ACTIVITY LIMITATIONS: sitting, standing, squatting, stairs, transfers, and locomotion level  PARTICIPATION LIMITATIONS: meal prep, cleaning, and community activity  PERSONAL FACTORS: Age and Fitness are also affecting patient's functional outcome.   REHAB POTENTIAL: Good  CLINICAL DECISION MAKING: Stable/uncomplicated  EVALUATION COMPLEXITY: Low   GOALS: Goals  reviewed with patient? yes  SHORT TERM GOALS: Target date: 12/27/2022  Patient will be independent in self management strategies to improve quality of life and functional outcomes. Baseline: New Program Goal status: MET  2.  Patient will report at least 50% improvement in overall symptoms and/or function to demonstrate improved functional mobility Baseline: 0% better Goal status: MET  3.  Patient's surgical incisions and wounds on bottom of foot will be completely healed to reduce risk of infection. Baseline: Current open wounds on bottom of foot Goal status: MET  4.  Per MD protocol patient will be able to stand on left lower extremity for at least 30 seconds without pain to demonstrate improved weightbearing tolerance to left lower leg. Baseline: Currently unable and restricted Goal status: PROGRESSING- can in boot not in shoe    LONG TERM GOALS: Target date: 01/31/2023   Patient will report at least 75% improvement in overall symptoms and/or function to demonstrate improved functional mobility Baseline: 0% better Goal status: MET  2.  Patient will improve score on FOTO outcomes measure to projected score to demonstrate overall improved function and QOL Baseline: see above Goal status: PROGRESSING  3.  Per MD protocol patient will be able to ambulate in regular shoe without severe pain for at least 10 minutes to improve ability to walk around home and community. Baseline: unable Goal status: PROGRESSING  4.  Patient will be able to a send and descend stairs in home with step through gait and use of railing as needed to improve ability to go up and down the stairs in his own home Baseline: Unable Goal status: UNABLE per protocol    PLAN:  PT FREQUENCY: 2x/week  PT DURATION: 10 weeks  PLANNED INTERVENTIONS: Therapeutic exercises, Therapeutic activity, Neuromuscular re-education, Balance training, Gait training, Patient/Family education, Self Care, Joint mobilization,  Joint manipulation, Stair training, Vestibular training, Canalith repositioning, Orthotic/Fit training, Prosthetic training, DME instructions, Aquatic Therapy, Dry Needling, Electrical stimulation, Spinal manipulation, Spinal mobilization, Cryotherapy, Moist heat, Taping, Traction, Ultrasound, Ionotophoresis 4mg /ml Dexamethasone, Manual therapy, and Re-evaluation.   PLAN FOR NEXT SESSION: progress WB - gentle - in shoe for static exercises and level surfaces, LE strengthening as tolerated    3:14 PM, 12/29/22 Tereasa Coop, DPT Physical Therapy with [facility]

## 2022-12-30 ENCOUNTER — Ambulatory Visit: Payer: Medicare HMO

## 2022-12-30 ENCOUNTER — Other Ambulatory Visit: Payer: Self-pay | Admitting: Primary Care

## 2022-12-30 ENCOUNTER — Ambulatory Visit: Payer: Medicare HMO | Admitting: Podiatry

## 2022-12-30 ENCOUNTER — Encounter: Payer: Self-pay | Admitting: Podiatry

## 2022-12-30 DIAGNOSIS — S86219A Strain of muscle(s) and tendon(s) of anterior muscle group at lower leg level, unspecified leg, initial encounter: Secondary | ICD-10-CM

## 2022-12-30 DIAGNOSIS — Z9889 Other specified postprocedural states: Secondary | ICD-10-CM

## 2022-12-30 DIAGNOSIS — M1A9XX0 Chronic gout, unspecified, without tophus (tophi): Secondary | ICD-10-CM

## 2022-12-30 NOTE — Progress Notes (Signed)
He presents today date of surgery 10/15/2018 for left foot tibialis anterior tendon repair and transfer with cast application and graft.  He states that he is doing great denies fever chills nausea vomit muscle aches pains states that I am really happy with the outcome has nearly completed his second round of physical therapy.  Objective: Vital signs are stable alert oriented x 3.  He presents today ambulating minor limp to the left side with tennis shoe on.  He demonstrates minimal edema no erythema cellulitis drainage or odor incision site has nearly 100% resolved can barely see it any longer.  He has good dorsiflexion and inversion of the foot.  Assessment: Well-healing surgical foot.  Plan: At this point I highly recommended that he have physical therapy work on deceleration of the foot as well as stairs ascending and descending.  I will follow-up with him in 6 weeks did discuss the fact that he may have to go back in the boot occasionally if he starts to develop tenderness he understands that and is amenable to it.

## 2022-12-30 NOTE — Telephone Encounter (Signed)
Patient is due for CPE/follow up in early December (last CPE 03/02/22), this will be required prior to any further refills.  Please schedule, thank you!

## 2022-12-30 NOTE — Telephone Encounter (Signed)
Spoke with patient and he stated that him and his wife do there physicals together. He will callback to schedule once he speak with her and look at her schedule.

## 2023-01-05 ENCOUNTER — Ambulatory Visit: Payer: Medicare HMO | Admitting: Physical Therapy

## 2023-01-05 ENCOUNTER — Encounter: Payer: Self-pay | Admitting: Physical Therapy

## 2023-01-05 ENCOUNTER — Encounter: Payer: Medicare HMO | Admitting: Physical Therapy

## 2023-01-05 DIAGNOSIS — M6281 Muscle weakness (generalized): Secondary | ICD-10-CM

## 2023-01-05 DIAGNOSIS — M25572 Pain in left ankle and joints of left foot: Secondary | ICD-10-CM | POA: Diagnosis not present

## 2023-01-05 DIAGNOSIS — R262 Difficulty in walking, not elsewhere classified: Secondary | ICD-10-CM | POA: Diagnosis not present

## 2023-01-05 NOTE — Therapy (Signed)
OUTPATIENT PHYSICAL THERAPY LOWER EXTREMITY TREATMENT    Patient Name: Devin Howell MRN: [id number] DOB:1950-10-18, 72 y.o., male Today's Date: 01/05/2023  END OF SESSION:  PT End of Session - 01/05/23 1016     Visit Number 10    Number of Visits 20    Date for PT Re-Evaluation 01/31/23    Progress Note Due on Visit 18    PT Start Time 1018    PT Stop Time 1058    PT Time Calculation (min) 40 min    Activity Tolerance Patient tolerated treatment well    Behavior During Therapy [facility] for tasks assessed/performed             Past Medical History:  Diagnosis Date   AKI (acute kidney injury) (HCC) 08/15/2020   Cataract    Chronic gout    CKD (chronic kidney disease)    pt. denies   Erectile dysfunction    GERD (gastroesophageal reflux disease)    Herpes simplex    Hx of adenomatous polyp of colon 04/12/2017   Hyperlipidemia    pt. denies   Hypertension    pt. denies   OSA on CPAP    Sleep apnea    cpap   Subarachnoid hematoma (HCC)    Subdural hematoma (HCC)    Testosterone deficiency    Past Surgical History:  Procedure Laterality Date   COLONOSCOPY     ESOPHAGOGASTRODUODENOSCOPY     TONSILLECTOMY AND ADENOIDECTOMY  1960   Patient Active Problem List   Diagnosis Date Noted   Chronic neck pain 02/04/2021   Alcohol dependence (HCC) 02/04/2021   Elevated blood pressure reading 01/19/2021   Obesity (BMI 30-39.9) 01/19/2021   Abnormal gait 12/24/2020   Bilateral lower extremity edema 12/24/2020   Thrombocytopenia (HCC) 09/08/2020   LFT elevation    Hypothyroidism 02/13/2019   Hx of adenomatous polyp of colon 04/12/2017   Anxiety and depression 03/01/2017   Erectile dysfunction 12/13/2016   Preventative health care 12/13/2016   Hiccups 06/10/2016   Chronic gout without tophus 04/20/2016   Genital herpes simplex 04/20/2016   Testosterone deficiency 04/20/2016   CKD (chronic kidney disease) stage 3, GFR 30-59 ml/min (HCC) 04/20/2016   Essential  hypertension 04/20/2016   Hyperlipidemia 04/20/2016    PCP: Doreene Nest, NP  REFERRING PROVIDER: Elinor Parkinson, DPM  REFERRING DIAG: [phone] (ICD-10-CM) - Tear of tibialis anterior tendon  THERAPY DIAG:  Pain in left ankle and joints of left foot  Muscle weakness (generalized)  Difficulty in walking, not elsewhere classified  Rationale for Evaluation and Treatment: Rehabilitation  ONSET DATE: DOS 10/15/22 LT FOOT PRIMARY REPAIR TIBIALIS ANTERIOR TENDON W/GRAFT W/CAST APPLICATION   SUBJECTIVE:   SUBJECTIVE STATEMENT: 01/05/2023 No restrictions and is now wearing shoe full time. No pain no soreness. Has been doing stairs but taking them one at a time. States he took compression garment off and it swelled up and took 2 days to return to normal.  Eval: States he doesn't have any pain. States he has difficulties donning compression sock. Been good about non -weightbearing on his left leg. States that overall he feels he is doing well. Reports he originally hurt his foot 50 some years ago and he slipped on a side walk and broke his ankle and then wear and tear resulted in the tear. States prior to the surgery he was having difficulty with walking and he felt like his foot would just flop down.    PERTINENT HISTORY: HTN - controlled  with medication PAIN:  Are you having pain? Yes: NPRS scale: 0/10 Pain location: front of ankle  Pain description: stiffness Aggravating factors: motion Relieving factors: motion  PRECAUTIONS: None I am going to have him start with his partial weightbearing this week progressing to full weightbearing by the first of next week and then I will follow-up with him in 2 weeks.   RED FLAGS: None   WEIGHT BEARING RESTRICTIONS: Yes NWB in boot  FALLS:  Has patient fallen in last 6 months? Yes. Number of falls 1 time a few weeks back while in case  LIVING ENVIRONMENT: Lives with: lives with their family Lives in: House/apartment Stairs: Yes:  Internal: 12 steps; on left going up Has following equipment at home: Crutches and scooter  OCCUPATION: retired  PLOF: Independent  PATIENT GOALS: to be able to walk again and get back to function   NEXT MD VISIT: 11/30/22  OBJECTIVE:    PATIENT SURVEYS:  FOTO:  Initial : 45.2    COGNITION: Overall cognitive status: Within functional limits for tasks assessed     SENSATION: Not tested  EDEMA/Observation: Circumferential: at ankle L 29.0 cm, right 27.75 cm - incisions healed no wounds on bottom of foot     PALPATION: No tenderness to palpation along Forefoot    LE Measurements Lower Extremity Right EVAL Left EVAL   A/PROM MMT A/PROM MMT  Hip Flexion      Hip Extension      Hip Abduction      Hip Adduction      Hip Internal rotation      Hip External rotation      Knee Flexion      Knee Extension      Ankle Dorsiflexion      Ankle Plantarflexion      Ankle Inversion      Ankle Eversion       (Blank rows = not tested) * pain   LOWER EXTREMITY SPECIAL TESTS:  Negative Homans' sign  FUNCTIONAL TESTS:  Able to transition from sit to stand and stand to sit with 75% weight on R LE in shoe  GAIT: Uses boot to walk   TODAY'S TREATMENT:                                                                                                                              DATE:   01/05/2023  Therapeutic Exercise: Supine:    Seated:     AEROBIC: BIKE  4 minutes -focus on gentle ROM L1   Standing: in tennis shoe DF at wall 3x10 5" holds B, DL heel raises  at counter 3x10 DL, 4 inch step 2 hand support x25 L up and controlled, side step ups x25 L, 4" step downs L 2 hand support to off load x25    Neuromuscular Re-education: Manual Therapy:  Therapeutic Activity: Self Care:  Trigger Point Dry Needling:  Modalities:    PATIENT EDUCATION:  Education  details: on HEP Person educated: Patient Education method: Explanation, Demonstration, and Handouts Education  comprehension: verbalized understanding   HOME EXERCISE PROGRAM: [id number]  ASSESSMENT:  CLINICAL IMPRESSION: 01/05/2023 Progressed standing exercises and ankle easily fatigued. Superset exercises to reduce fatigue with exercises. Added stairs at 4 inch height which were tolerated well. No pain or significant pulling noted during or after session. Added standing heel raise and DF to HEP. Increased to 2x/week now that patient is out of boot and can work on more functional tasks, strengthening, gait and balance. Will continue to benefit from skilled PT at this time.   Eval: Patient presents today after under going left tibialis anterior surgical repair on 10/15/2022.  Patient presents with significant swelling throughout lower extremity and incisions still healing.  Noted that patient has 2 small wounds on the ball of the foot and moisture between toes.  Session focused on education of wound care/incision care as well as lower extremity management.  Stressed continued focus of edema management and elevation above heart.  Discussed using compression garment but might need to transition to full size compression garment to below knee as current compression garment rests on one of his incision sites causing irritation.  Patient would greatly benefit from skilled physical therapy to improve overall function, range of motion, and ability to walk at home and in the community.  OBJECTIVE IMPAIRMENTS: Abnormal gait, decreased activity tolerance, decreased balance, decreased coordination, decreased knowledge of use of DME, decreased mobility, difficulty walking, decreased ROM, decreased strength, increased edema, improper body mechanics, postural dysfunction, and pain.   ACTIVITY LIMITATIONS: sitting, standing, squatting, stairs, transfers, and locomotion level  PARTICIPATION LIMITATIONS: meal prep, cleaning, and community activity  PERSONAL FACTORS: Age and Fitness are also affecting patient's functional  outcome.   REHAB POTENTIAL: Good  CLINICAL DECISION MAKING: Stable/uncomplicated  EVALUATION COMPLEXITY: Low   GOALS: Goals reviewed with patient? yes  SHORT TERM GOALS: Target date: 12/27/2022  Patient will be independent in self management strategies to improve quality of life and functional outcomes. Baseline: New Program Goal status: MET  2.  Patient will report at least 50% improvement in overall symptoms and/or function to demonstrate improved functional mobility Baseline: 0% better Goal status: MET  3.  Patient's surgical incisions and wounds on bottom of foot will be completely healed to reduce risk of infection. Baseline: Current open wounds on bottom of foot Goal status: MET  4.  Per MD protocol patient will be able to stand on left lower extremity for at least 30 seconds without pain to demonstrate improved weightbearing tolerance to left lower leg. Baseline: Currently unable and restricted Goal status: PROGRESSING- can in boot not in shoe    LONG TERM GOALS: Target date: 01/31/2023   Patient will report at least 75% improvement in overall symptoms and/or function to demonstrate improved functional mobility Baseline: 0% better Goal status: MET  2.  Patient will improve score on FOTO outcomes measure to projected score to demonstrate overall improved function and QOL Baseline: see above Goal status: PROGRESSING  3.  Per MD protocol patient will be able to ambulate in regular shoe without severe pain for at least 10 minutes to improve ability to walk around home and community. Baseline: unable Goal status: PROGRESSING  4.  Patient will be able to a send and descend stairs in home with step through gait and use of railing as needed to improve ability to go up and down the stairs in his own home Baseline: Unable Goal status: UNABLE per  protocol    PLAN:  PT FREQUENCY: 2x/week  PT DURATION: 10 weeks  PLANNED INTERVENTIONS: Therapeutic exercises,  Therapeutic activity, Neuromuscular re-education, Balance training, Gait training, Patient/Family education, Self Care, Joint mobilization, Joint manipulation, Stair training, Vestibular training, Canalith repositioning, Orthotic/Fit training, Prosthetic training, DME instructions, Aquatic Therapy, Dry Needling, Electrical stimulation, Spinal manipulation, Spinal mobilization, Cryotherapy, Moist heat, Taping, Traction, Ultrasound, Ionotophoresis 4mg /ml Dexamethasone, Manual therapy, and Re-evaluation.   PLAN FOR NEXT SESSION: add balance exercises, work on stairs and gait   11:08 AM, 01/05/23 Tereasa Coop, DPT Physical Therapy with [facility]

## 2023-01-10 ENCOUNTER — Encounter: Payer: Medicare HMO | Admitting: Physical Therapy

## 2023-01-12 ENCOUNTER — Ambulatory Visit: Payer: Medicare HMO | Admitting: Physical Therapy

## 2023-01-12 ENCOUNTER — Encounter: Payer: Self-pay | Admitting: Physical Therapy

## 2023-01-12 DIAGNOSIS — M25572 Pain in left ankle and joints of left foot: Secondary | ICD-10-CM | POA: Diagnosis not present

## 2023-01-12 DIAGNOSIS — R262 Difficulty in walking, not elsewhere classified: Secondary | ICD-10-CM | POA: Diagnosis not present

## 2023-01-12 DIAGNOSIS — M6281 Muscle weakness (generalized): Secondary | ICD-10-CM | POA: Diagnosis not present

## 2023-01-12 NOTE — Therapy (Signed)
OUTPATIENT PHYSICAL THERAPY LOWER EXTREMITY TREATMENT    Patient Name: Devin Howell MRN: [id number] DOB:[birth year], 72 y.o., male Today's Date: 01/12/2023  END OF SESSION:  PT End of Session - 01/12/23 1431     Visit Number 11    Number of Visits 20    Date for PT Re-Evaluation 01/31/23    Progress Note Due on Visit 18    PT Start Time 1432    PT Stop Time 1512    PT Time Calculation (min) 40 min    Activity Tolerance Patient tolerated treatment well    Behavior During Therapy [facility] for tasks assessed/performed             Past Medical History:  Diagnosis Date   AKI (acute kidney injury) (HCC) 08/15/2020   Cataract    Chronic gout    CKD (chronic kidney disease)    pt. denies   Erectile dysfunction    GERD (gastroesophageal reflux disease)    Herpes simplex    Hx of adenomatous polyp of colon 04/12/2017   Hyperlipidemia    pt. denies   Hypertension    pt. denies   OSA on CPAP    Sleep apnea    cpap   Subarachnoid hematoma (HCC)    Subdural hematoma (HCC)    Testosterone deficiency    Past Surgical History:  Procedure Laterality Date   COLONOSCOPY     ESOPHAGOGASTRODUODENOSCOPY     TONSILLECTOMY AND ADENOIDECTOMY  1960   Patient Active Problem List   Diagnosis Date Noted   Chronic neck pain 02/04/2021   Alcohol dependence (HCC) 02/04/2021   Elevated blood pressure reading 01/19/2021   Obesity (BMI 30-39.9) 01/19/2021   Abnormal gait 12/24/2020   Bilateral lower extremity edema 12/24/2020   Thrombocytopenia (HCC) 09/08/2020   LFT elevation    Hypothyroidism 02/13/2019   Hx of adenomatous polyp of colon 04/12/2017   Anxiety and depression 03/01/2017   Erectile dysfunction 12/13/2016   Preventative health care 12/13/2016   Hiccups 06/10/2016   Chronic gout without tophus 04/20/2016   Genital herpes simplex 04/20/2016   Testosterone deficiency 04/20/2016   CKD (chronic kidney disease) stage 3, GFR 30-59 ml/min (HCC) 04/20/2016   Essential  hypertension 04/20/2016   Hyperlipidemia 04/20/2016    PCP: Doreene Nest, NP  REFERRING PROVIDER: Elinor Parkinson, DPM  REFERRING DIAG: [phone] (ICD-10-CM) - Tear of tibialis anterior tendon  THERAPY DIAG:  Pain in left ankle and joints of left foot  Muscle weakness (generalized)  Difficulty in walking, not elsewhere classified  Rationale for Evaluation and Treatment: Rehabilitation  ONSET DATE: DOS 10/15/22 LT FOOT PRIMARY REPAIR TIBIALIS ANTERIOR TENDON W/GRAFT W/CAST APPLICATION   SUBJECTIVE:   SUBJECTIVE STATEMENT: 01/12/2023 No pain or soreness. States he has been busy with family matters so he has mostly been walking but hasn't been doing as much as he would like.   Eval: States he doesn't have any pain. States he has difficulties donning compression sock. Been good about non -weightbearing on his left leg. States that overall he feels he is doing well. Reports he originally hurt his foot 50 some years ago and he slipped on a side walk and broke his ankle and then wear and tear resulted in the tear. States prior to the surgery he was having difficulty with walking and he felt like his foot would just flop down.    PERTINENT HISTORY: HTN - controlled with medication PAIN:  Are you having pain? Yes: NPRS scale: 0/10 Pain  location: front of ankle  Pain description: stiffness Aggravating factors: motion Relieving factors: motion  PRECAUTIONS: None I am going to have him start with his partial weightbearing this week progressing to full weightbearing by the first of next week and then I will follow-up with him in 2 weeks.   RED FLAGS: None   WEIGHT BEARING RESTRICTIONS: Yes NWB in boot  FALLS:  Has patient fallen in last 6 months? Yes. Number of falls 1 time a few weeks back while in case  LIVING ENVIRONMENT: Lives with: lives with their family Lives in: House/apartment Stairs: Yes: Internal: 12 steps; on left going up Has following equipment at home: Crutches  and scooter  OCCUPATION: retired  PLOF: Independent  PATIENT GOALS: to be able to walk again and get back to function   NEXT MD VISIT: 11/30/22  OBJECTIVE:    PATIENT SURVEYS:  FOTO:  Initial : 45.2    COGNITION: Overall cognitive status: Within functional limits for tasks assessed     SENSATION: Not tested  EDEMA/Observation: Circumferential: at ankle L 29.0 cm, right 27.75 cm - incisions healed no wounds on bottom of foot     PALPATION: No tenderness to palpation along Forefoot    LE Measurements Lower Extremity Right EVAL Left EVAL   A/PROM MMT A/PROM MMT  Hip Flexion      Hip Extension      Hip Abduction      Hip Adduction      Hip Internal rotation      Hip External rotation      Knee Flexion      Knee Extension      Ankle Dorsiflexion      Ankle Plantarflexion      Ankle Inversion      Ankle Eversion       (Blank rows = not tested) * pain   LOWER EXTREMITY SPECIAL TESTS:  Negative Homans' sign  FUNCTIONAL TESTS:  Able to transition from sit to stand and stand to sit with 75% weight on R LE in shoe     TODAY'S TREATMENT:                                                                                                                              DATE:   01/12/2023  Therapeutic Exercise: Supine:    Seated:   DF B x25 5" holds    Standing: in tennis shoe fwd and lateral reaches over table x10 B and each direction, DF at counter x15    Neuromuscular Re-education: tandem on floor x3 30" holds B 1 finger support, standing on foam with EC x5 30" holds, staggered stance holds x3 30" holds B, staggered stance foot on 6" step x3 30" holds B   Manual Therapy:  Therapeutic Activity: Self Care:  Trigger Point Dry Needling:  Modalities:    PATIENT EDUCATION:  Education details: on HEP Person educated: Patient Education method: Programmer, multimedia, Facilities manager, and Automotive engineer  comprehension: verbalized understanding   HOME EXERCISE  PROGRAM: [email or web address]  ASSESSMENT:  CLINICAL IMPRESSION: 01/12/2023 Session focused on standing endurance and balance.  Tolerated this very well with no reports of pain.  Difficulties with balance on surgical leg but no loss of balance.  Overall patient tolerated session well with no reports of pain or discomfort.  Added new exercises to home exercise program.  Will continue to benefit from skilled PT at this time.  Eval: Patient presents today after under going left tibialis anterior surgical repair on 10/15/2022.  Patient presents with significant swelling throughout lower extremity and incisions still healing.  Noted that patient has 2 small wounds on the ball of the foot and moisture between toes.  Session focused on education of wound care/incision care as well as lower extremity management.  Stressed continued focus of edema management and elevation above heart.  Discussed using compression garment but might need to transition to full size compression garment to below knee as current compression garment rests on one of his incision sites causing irritation.  Patient would greatly benefit from skilled physical therapy to improve overall function, range of motion, and ability to walk at home and in the community.  OBJECTIVE IMPAIRMENTS: Abnormal gait, decreased activity tolerance, decreased balance, decreased coordination, decreased knowledge of use of DME, decreased mobility, difficulty walking, decreased ROM, decreased strength, increased edema, improper body mechanics, postural dysfunction, and pain.   ACTIVITY LIMITATIONS: sitting, standing, squatting, stairs, transfers, and locomotion level  PARTICIPATION LIMITATIONS: meal prep, cleaning, and community activity  PERSONAL FACTORS: Age and Fitness are also affecting patient's functional outcome.   REHAB POTENTIAL: Good  CLINICAL DECISION MAKING: Stable/uncomplicated  EVALUATION COMPLEXITY: Low   GOALS: Goals reviewed with patient?  yes  SHORT TERM GOALS: Target date: 12/27/2022  Patient will be independent in self management strategies to improve quality of life and functional outcomes. Baseline: New Program Goal status: MET  2.  Patient will report at least 50% improvement in overall symptoms and/or function to demonstrate improved functional mobility Baseline: 0% better Goal status: MET  3.  Patient's surgical incisions and wounds on bottom of foot will be completely healed to reduce risk of infection. Baseline: Current open wounds on bottom of foot Goal status: MET  4.  Per MD protocol patient will be able to stand on left lower extremity for at least 30 seconds without pain to demonstrate improved weightbearing tolerance to left lower leg. Baseline: Currently unable and restricted Goal status: PROGRESSING- can in boot not in shoe    LONG TERM GOALS: Target date: 01/31/2023   Patient will report at least 75% improvement in overall symptoms and/or function to demonstrate improved functional mobility Baseline: 0% better Goal status: MET  2.  Patient will improve score on FOTO outcomes measure to projected score to demonstrate overall improved function and QOL Baseline: see above Goal status: PROGRESSING  3.  Per MD protocol patient will be able to ambulate in regular shoe without severe pain for at least 10 minutes to improve ability to walk around home and community. Baseline: unable Goal status: PROGRESSING  4.  Patient will be able to a send and descend stairs in home with step through gait and use of railing as needed to improve ability to go up and down the stairs in his own home Baseline: Unable Goal status: UNABLE per protocol    PLAN:  PT FREQUENCY: 2x/week  PT DURATION: 10 weeks  PLANNED INTERVENTIONS: Therapeutic exercises, Therapeutic activity, Neuromuscular re-education, Balance training, Gait  training, Patient/Family education, Self Care, Joint mobilization, Joint manipulation, Stair  training, Vestibular training, Canalith repositioning, Orthotic/Fit training, Prosthetic training, DME instructions, Aquatic Therapy, Dry Needling, Electrical stimulation, Spinal manipulation, Spinal mobilization, Cryotherapy, Moist heat, Taping, Traction, Ultrasound, Ionotophoresis 4mg /ml Dexamethasone, Manual therapy, and Re-evaluation.   PLAN FOR NEXT SESSION: add balance exercises, work on stairs and gait   3:17 PM, 01/12/23 Tereasa Coop, DPT Physical Therapy with [place]

## 2023-01-14 ENCOUNTER — Other Ambulatory Visit: Payer: Self-pay | Admitting: Primary Care

## 2023-01-14 DIAGNOSIS — E782 Mixed hyperlipidemia: Secondary | ICD-10-CM

## 2023-01-14 NOTE — Telephone Encounter (Signed)
Patient is due for CPE/follow up in early December, this will be required prior to any further refills.  Please schedule, thank you!

## 2023-01-14 NOTE — Telephone Encounter (Signed)
Patient has been scheduled

## 2023-01-17 ENCOUNTER — Ambulatory Visit: Payer: Medicare HMO | Admitting: Physical Therapy

## 2023-01-17 ENCOUNTER — Encounter: Payer: Self-pay | Admitting: Physical Therapy

## 2023-01-17 DIAGNOSIS — M25572 Pain in left ankle and joints of left foot: Secondary | ICD-10-CM | POA: Diagnosis not present

## 2023-01-17 DIAGNOSIS — R262 Difficulty in walking, not elsewhere classified: Secondary | ICD-10-CM | POA: Diagnosis not present

## 2023-01-17 DIAGNOSIS — M6281 Muscle weakness (generalized): Secondary | ICD-10-CM

## 2023-01-17 NOTE — Therapy (Signed)
OUTPATIENT PHYSICAL THERAPY LOWER EXTREMITY TREATMENT    Patient Name: Devin Howell MRN: [id number] DOB:Jun 05, 1950, 72 y.o., male Today's Date: 01/17/2023  END OF SESSION:  PT End of Session - 01/17/23 1019     Visit Number 12    Number of Visits 20    Date for PT Re-Evaluation 01/31/23    Progress Note Due on Visit 18    PT Start Time 1016    PT Stop Time 1055    PT Time Calculation (min) 39 min    Activity Tolerance Patient tolerated treatment well    Behavior During Therapy WFL for tasks assessed/performed             Past Medical History:  Diagnosis Date   AKI (acute kidney injury) (HCC) 08/15/2020   Cataract    Chronic gout    CKD (chronic kidney disease)    pt. denies   Erectile dysfunction    GERD (gastroesophageal reflux disease)    Herpes simplex    Hx of adenomatous polyp of colon 04/12/2017   Hyperlipidemia    pt. denies   Hypertension    pt. denies   OSA on CPAP    Sleep apnea    cpap   Subarachnoid hematoma (HCC)    Subdural hematoma (HCC)    Testosterone deficiency    Past Surgical History:  Procedure Laterality Date   COLONOSCOPY     ESOPHAGOGASTRODUODENOSCOPY     TONSILLECTOMY AND ADENOIDECTOMY  1960   Patient Active Problem List   Diagnosis Date Noted   Chronic neck pain 02/04/2021   Alcohol dependence (HCC) 02/04/2021   Elevated blood pressure reading 01/19/2021   Obesity (BMI 30-39.9) 01/19/2021   Abnormal gait 12/24/2020   Bilateral lower extremity edema 12/24/2020   Thrombocytopenia (HCC) 09/08/2020   LFT elevation    Hypothyroidism 02/13/2019   Hx of adenomatous polyp of colon 04/12/2017   Anxiety and depression 03/01/2017   Erectile dysfunction 12/13/2016   Preventative health care 12/13/2016   Hiccups 06/10/2016   Chronic gout without tophus 04/20/2016   Genital herpes simplex 04/20/2016   Testosterone deficiency 04/20/2016   CKD (chronic kidney disease) stage 3, GFR 30-59 ml/min (HCC) 04/20/2016   Essential  hypertension 04/20/2016   Hyperlipidemia 04/20/2016    PCP: Doreene Nest, NP  REFERRING PROVIDER: Elinor Parkinson, DPM  REFERRING DIAG: [phone] (ICD-10-CM) - Tear of tibialis anterior tendon  THERAPY DIAG:  Pain in left ankle and joints of left foot  Muscle weakness (generalized)  Difficulty in walking, not elsewhere classified  Rationale for Evaluation and Treatment: Rehabilitation  ONSET DATE: DOS 10/15/22 LT FOOT PRIMARY REPAIR TIBIALIS ANTERIOR TENDON W/GRAFT W/CAST APPLICATION   SUBJECTIVE:   SUBJECTIVE STATEMENT: 01/17/2023 Reports no pain, tandem exercise is still wobbly.  Eval: States he doesn't have any pain. States he has difficulties donning compression sock. Been good about non -weightbearing on his left leg. States that overall he feels he is doing well. Reports he originally hurt his foot 50 some years ago and he slipped on a side walk and broke his ankle and then wear and tear resulted in the tear. States prior to the surgery he was having difficulty with walking and he felt like his foot would just flop down.    PERTINENT HISTORY: HTN - controlled with medication PAIN:  Are you having pain? Yes: NPRS scale: 0/10 Pain location: front of ankle  Pain description: stiffness Aggravating factors: motion Relieving factors: motion  PRECAUTIONS: None I am going to  have him start with his partial weightbearing this week progressing to full weightbearing by the first of next week and then I will follow-up with him in 2 weeks.   RED FLAGS: None   WEIGHT BEARING RESTRICTIONS: Yes NWB in boot  FALLS:  Has patient fallen in last 6 months? Yes. Number of falls 1 time a few weeks back while in case  LIVING ENVIRONMENT: Lives with: lives with their family Lives in: House/apartment Stairs: Yes: Internal: 12 steps; on left going up Has following equipment at home: Crutches and scooter  OCCUPATION: retired  PLOF: Independent  PATIENT GOALS: to be able to walk  again and get back to function   NEXT MD VISIT: 11/30/22  OBJECTIVE:    PATIENT SURVEYS:  FOTO:  Initial : 45.2    COGNITION: Overall cognitive status: Within functional limits for tasks assessed     SENSATION: Not tested  EDEMA/Observation: Circumferential: at ankle L 29.0 cm, right 27.75 cm - incisions healed no wounds on bottom of foot     PALPATION: No tenderness to palpation along Forefoot    LE Measurements Lower Extremity Right EVAL Left EVAL   A/PROM MMT A/PROM MMT  Hip Flexion      Hip Extension      Hip Abduction      Hip Adduction      Hip Internal rotation      Hip External rotation      Knee Flexion      Knee Extension      Ankle Dorsiflexion      Ankle Plantarflexion      Ankle Inversion      Ankle Eversion       (Blank rows = not tested) * pain   LOWER EXTREMITY SPECIAL TESTS:  Negative Homans' sign  FUNCTIONAL TESTS:  Able to transition from sit to stand and stand to sit with 75% weight on R LE in shoe     TODAY'S TREATMENT:                                                                                                                              DATE:   01/17/2023  Therapeutic Exercise: Supine:  bridge with DF B 3x6, piriformis stretch - too challenging, FABER stretch x10 10" holds B, butterfly stretch x10 10" hlds  Seated:   DF B x10 10" holds    Standing: in tennis shoe tandem walking with one hand support 3 minutes, Step up/down 2x10 5" hlds 4" step, suitcase carry 15# in right hand slow and controlled walking - 210 minutes total, back at wall DF overhead reach x10 10" hold B     Neuromuscular Re-education:   Manual Therapy:  Therapeutic Activity: Self Care:  Trigger Point Dry Needling:  Modalities:    PATIENT EDUCATION:  Education details: on HEP Person educated: Patient Education method: Explanation, Demonstration, and Handouts Education comprehension: verbalized understanding   HOME EXERCISE  PROGRAM: [id number]  ASSESSMENT:  CLINICAL IMPRESSION: 01/17/2023 Session continue to focus on functional strengthening.  Compensations with frontal plane motion with ambulation to increase functional dorsiflexion.  Added suitcase carry it with weight in her right arm and this significantly helped left hip muscle activation and improved walking mechanics afterwards.  No pain noted during or after session but fatigue in hip and legs.  Limitation in hip motion noted, added stretch to address this.  Reviewed home exercise program and discussed performing about 40 minutes a day and slowly having up walking as tolerated.  Will continue with current plan of care as tolerated.  Eval: Patient presents today after under going left tibialis anterior surgical repair on 10/15/2022.  Patient presents with significant swelling throughout lower extremity and incisions still healing.  Noted that patient has 2 small wounds on the ball of the foot and moisture between toes.  Session focused on education of wound care/incision care as well as lower extremity management.  Stressed continued focus of edema management and elevation above heart.  Discussed using compression garment but might need to transition to full size compression garment to below knee as current compression garment rests on one of his incision sites causing irritation.  Patient would greatly benefit from skilled physical therapy to improve overall function, range of motion, and ability to walk at home and in the community.  OBJECTIVE IMPAIRMENTS: Abnormal gait, decreased activity tolerance, decreased balance, decreased coordination, decreased knowledge of use of DME, decreased mobility, difficulty walking, decreased ROM, decreased strength, increased edema, improper body mechanics, postural dysfunction, and pain.   ACTIVITY LIMITATIONS: sitting, standing, squatting, stairs, transfers, and locomotion level  PARTICIPATION LIMITATIONS: meal prep, cleaning,  and community activity  PERSONAL FACTORS: Age and Fitness are also affecting patient's functional outcome.   REHAB POTENTIAL: Good  CLINICAL DECISION MAKING: Stable/uncomplicated  EVALUATION COMPLEXITY: Low   GOALS: Goals reviewed with patient? yes  SHORT TERM GOALS: Target date: 12/27/2022  Patient will be independent in self management strategies to improve quality of life and functional outcomes. Baseline: New Program Goal status: MET  2.  Patient will report at least 50% improvement in overall symptoms and/or function to demonstrate improved functional mobility Baseline: 0% better Goal status: MET  3.  Patient's surgical incisions and wounds on bottom of foot will be completely healed to reduce risk of infection. Baseline: Current open wounds on bottom of foot Goal status: MET  4.  Per MD protocol patient will be able to stand on left lower extremity for at least 30 seconds without pain to demonstrate improved weightbearing tolerance to left lower leg. Baseline: Currently unable and restricted Goal status: PROGRESSING- can in boot not in shoe    LONG TERM GOALS: Target date: 01/31/2023   Patient will report at least 75% improvement in overall symptoms and/or function to demonstrate improved functional mobility Baseline: 0% better Goal status: MET  2.  Patient will improve score on FOTO outcomes measure to projected score to demonstrate overall improved function and QOL Baseline: see above Goal status: PROGRESSING  3.  Per MD protocol patient will be able to ambulate in regular shoe without severe pain for at least 10 minutes to improve ability to walk around home and community. Baseline: unable Goal status: PROGRESSING  4.  Patient will be able to a send and descend stairs in home with step through gait and use of railing as needed to improve ability to go up and down the stairs in his own home Baseline: Unable Goal status: UNABLE per  protocol    PLAN:  PT  FREQUENCY: 2x/week  PT DURATION: 10 weeks  PLANNED INTERVENTIONS: Therapeutic exercises, Therapeutic activity, Neuromuscular re-education, Balance training, Gait training, Patient/Family education, Self Care, Joint mobilization, Joint manipulation, Stair training, Vestibular training, Canalith repositioning, Orthotic/Fit training, Prosthetic training, DME instructions, Aquatic Therapy, Dry Needling, Electrical stimulation, Spinal manipulation, Spinal mobilization, Cryotherapy, Moist heat, Taping, Traction, Ultrasound, Ionotophoresis 4mg /ml Dexamethasone, Manual therapy, and Re-evaluation.   PLAN FOR NEXT SESSION: add balance exercises, work on stairs and gait   10:59 AM, 01/17/23 Tereasa Coop, DPT Physical Therapy with [facility]

## 2023-01-19 ENCOUNTER — Ambulatory Visit: Payer: Medicare HMO | Admitting: Physical Therapy

## 2023-01-19 ENCOUNTER — Encounter: Payer: Self-pay | Admitting: Physical Therapy

## 2023-01-19 DIAGNOSIS — M25572 Pain in left ankle and joints of left foot: Secondary | ICD-10-CM | POA: Diagnosis not present

## 2023-01-19 DIAGNOSIS — M6281 Muscle weakness (generalized): Secondary | ICD-10-CM | POA: Diagnosis not present

## 2023-01-19 DIAGNOSIS — R262 Difficulty in walking, not elsewhere classified: Secondary | ICD-10-CM | POA: Diagnosis not present

## 2023-01-19 NOTE — Therapy (Signed)
OUTPATIENT PHYSICAL THERAPY LOWER EXTREMITY TREATMENT    Patient Name: Devin Howell MRN: [id number] DOB:10-15-1950, 72 y.o., male Today's Date: 01/19/2023  END OF SESSION:  PT End of Session - 01/19/23 1430     Visit Number 13    Number of Visits 20    Date for PT Re-Evaluation 01/31/23    Progress Note Due on Visit 18    PT Start Time 1432    PT Stop Time 1510    PT Time Calculation (min) 38 min    Activity Tolerance Patient tolerated treatment well    Behavior During Therapy [facility] for tasks assessed/performed             Past Medical History:  Diagnosis Date   AKI (acute kidney injury) (HCC) 08/15/2020   Cataract    Chronic gout    CKD (chronic kidney disease)    pt. denies   Erectile dysfunction    GERD (gastroesophageal reflux disease)    Herpes simplex    Hx of adenomatous polyp of colon 04/12/2017   Hyperlipidemia    pt. denies   Hypertension    pt. denies   OSA on CPAP    Sleep apnea    cpap   Subarachnoid hematoma (HCC)    Subdural hematoma (HCC)    Testosterone deficiency    Past Surgical History:  Procedure Laterality Date   COLONOSCOPY     ESOPHAGOGASTRODUODENOSCOPY     TONSILLECTOMY AND ADENOIDECTOMY  1960   Patient Active Problem List   Diagnosis Date Noted   Chronic neck pain 02/04/2021   Alcohol dependence (HCC) 02/04/2021   Elevated blood pressure reading 01/19/2021   Obesity (BMI 30-39.9) 01/19/2021   Abnormal gait 12/24/2020   Bilateral lower extremity edema 12/24/2020   Thrombocytopenia (HCC) 09/08/2020   LFT elevation    Hypothyroidism 02/13/2019   Hx of adenomatous polyp of colon 04/12/2017   Anxiety and depression 03/01/2017   Erectile dysfunction 12/13/2016   Preventative health care 12/13/2016   Hiccups 06/10/2016   Chronic gout without tophus 04/20/2016   Genital herpes simplex 04/20/2016   Testosterone deficiency 04/20/2016   CKD (chronic kidney disease) stage 3, GFR 30-59 ml/min (HCC) 04/20/2016   Essential  hypertension 04/20/2016   Hyperlipidemia 04/20/2016    PCP: Doreene Nest, NP  REFERRING PROVIDER: Elinor Parkinson, DPM  REFERRING DIAG: [phone] (ICD-10-CM) - Tear of tibialis anterior tendon  THERAPY DIAG:  Pain in left ankle and joints of left foot  Muscle weakness (generalized)  Difficulty in walking, not elsewhere classified  Rationale for Evaluation and Treatment: Rehabilitation  ONSET DATE: DOS 10/15/22 LT FOOT PRIMARY REPAIR TIBIALIS ANTERIOR TENDON W/GRAFT W/CAST APPLICATION   SUBJECTIVE:   SUBJECTIVE STATEMENT: 01/19/2023 Reports no change in status and no pain or difficulties with exercises.   Eval: States he doesn't have any pain. States he has difficulties donning compression sock. Been good about non -weightbearing on his left leg. States that overall he feels he is doing well. Reports he originally hurt his foot 50 some years ago and he slipped on a side walk and broke his ankle and then wear and tear resulted in the tear. States prior to the surgery he was having difficulty with walking and he felt like his foot would just flop down.    PERTINENT HISTORY: HTN - controlled with medication PAIN:  Are you having pain? Yes: NPRS scale: 0/10 Pain location: front of ankle  Pain description: stiffness Aggravating factors: motion Relieving factors: motion  PRECAUTIONS:  None I am going to have him start with his partial weightbearing this week progressing to full weightbearing by the first of next week and then I will follow-up with him in 2 weeks.   RED FLAGS: None   WEIGHT BEARING RESTRICTIONS: Yes NWB in boot  FALLS:  Has patient fallen in last 6 months? Yes. Number of falls 1 time a few weeks back while in case  LIVING ENVIRONMENT: Lives with: lives with their family Lives in: House/apartment Stairs: Yes: Internal: 12 steps; on left going up Has following equipment at home: Crutches and scooter  OCCUPATION: retired  PLOF: Independent  PATIENT  GOALS: to be able to walk again and get back to function   NEXT MD VISIT: 11/30/22  OBJECTIVE:    PATIENT SURVEYS:  FOTO:  Initial : 45.2    COGNITION: Overall cognitive status: Within functional limits for tasks assessed     SENSATION: Not tested  EDEMA/Observation: Circumferential: at ankle L 29.0 cm, right 27.75 cm - incisions healed no wounds on bottom of foot     PALPATION: No tenderness to palpation along Forefoot    LE Measurements Lower Extremity Right EVAL Left EVAL   A/PROM MMT A/PROM MMT  Hip Flexion      Hip Extension      Hip Abduction      Hip Adduction      Hip Internal rotation      Hip External rotation      Knee Flexion      Knee Extension      Ankle Dorsiflexion      Ankle Plantarflexion      Ankle Inversion      Ankle Eversion       (Blank rows = not tested) * pain   LOWER EXTREMITY SPECIAL TESTS:  Negative Homans' sign  FUNCTIONAL TESTS:  Able to transition from sit to stand and stand to sit with 75% weight on R LE in shoe     TODAY'S TREATMENT:                                                                                                                              DATE:   01/19/2023 Therapeutic Exercise: Supine:     Seated:  PF/ DF B x10 10" holds, bosu lateral 2 minutes and then forward 2 minutes, then circles clockwise and counter clockwise 3x2 each   Standing:weight shifts lateral with PF activation on bosu left 3x10 5" holds B, lateral stepping x6 laps b, backwards walking x6 laps, step ups one finger support 6" step 3x6 L      Neuromuscular Re-education:  semi staggered stance on blue foam x2 30" holds B--> neck rotation 3x5 B, tandem on blue foam x2 30" holds  Manual Therapy:  Therapeutic Activity: Self Care:  Trigger Point Dry Needling:  Modalities:    PATIENT EDUCATION:  Education details: on HEP Person educated: Patient Education method: Programmer, multimedia, Facilities manager, and Automotive engineer  comprehension:  verbalized understanding   HOME EXERCISE PROGRAM: [id number]  ASSESSMENT:  CLINICAL IMPRESSION: 01/19/2023 Session focused on balance and lower extremity strengthening.  Overall patient doing very well and tolerating advanced exercises.  No pain noted during session just fatigue and leg and hips.  Added walking exercises to home program.  Will continue with current plan of care as tolerated.  Eval: Patient presents today after under going left tibialis anterior surgical repair on 10/15/2022.  Patient presents with significant swelling throughout lower extremity and incisions still healing.  Noted that patient has 2 small wounds on the ball of the foot and moisture between toes.  Session focused on education of wound care/incision care as well as lower extremity management.  Stressed continued focus of edema management and elevation above heart.  Discussed using compression garment but might need to transition to full size compression garment to below knee as current compression garment rests on one of his incision sites causing irritation.  Patient would greatly benefit from skilled physical therapy to improve overall function, range of motion, and ability to walk at home and in the community.  OBJECTIVE IMPAIRMENTS: Abnormal gait, decreased activity tolerance, decreased balance, decreased coordination, decreased knowledge of use of DME, decreased mobility, difficulty walking, decreased ROM, decreased strength, increased edema, improper body mechanics, postural dysfunction, and pain.   ACTIVITY LIMITATIONS: sitting, standing, squatting, stairs, transfers, and locomotion level  PARTICIPATION LIMITATIONS: meal prep, cleaning, and community activity  PERSONAL FACTORS: Age and Fitness are also affecting patient's functional outcome.   REHAB POTENTIAL: Good  CLINICAL DECISION MAKING: Stable/uncomplicated  EVALUATION COMPLEXITY: Low   GOALS: Goals reviewed with patient? yes  SHORT TERM GOALS:  Target date: 12/27/2022  Patient will be independent in self management strategies to improve quality of life and functional outcomes. Baseline: New Program Goal status: MET  2.  Patient will report at least 50% improvement in overall symptoms and/or function to demonstrate improved functional mobility Baseline: 0% better Goal status: MET  3.  Patient's surgical incisions and wounds on bottom of foot will be completely healed to reduce risk of infection. Baseline: Current open wounds on bottom of foot Goal status: MET  4.  Per MD protocol patient will be able to stand on left lower extremity for at least 30 seconds without pain to demonstrate improved weightbearing tolerance to left lower leg. Baseline: Currently unable and restricted Goal status: PROGRESSING- can in boot not in shoe    LONG TERM GOALS: Target date: 01/31/2023   Patient will report at least 75% improvement in overall symptoms and/or function to demonstrate improved functional mobility Baseline: 0% better Goal status: MET  2.  Patient will improve score on FOTO outcomes measure to projected score to demonstrate overall improved function and QOL Baseline: see above Goal status: PROGRESSING  3.  Per MD protocol patient will be able to ambulate in regular shoe without severe pain for at least 10 minutes to improve ability to walk around home and community. Baseline: unable Goal status: PROGRESSING  4.  Patient will be able to a send and descend stairs in home with step through gait and use of railing as needed to improve ability to go up and down the stairs in his own home Baseline: Unable Goal status: UNABLE per protocol    PLAN:  PT FREQUENCY: 2x/week  PT DURATION: 10 weeks  PLANNED INTERVENTIONS: Therapeutic exercises, Therapeutic activity, Neuromuscular re-education, Balance training, Gait training, Patient/Family education, Self Care, Joint mobilization, Joint manipulation, Stair training, Vestibular  training,  Canalith repositioning, Orthotic/Fit training, Prosthetic training, DME instructions, Aquatic Therapy, Dry Needling, Electrical stimulation, Spinal manipulation, Spinal mobilization, Cryotherapy, Moist heat, Taping, Traction, Ultrasound, Ionotophoresis 4mg /ml Dexamethasone, Manual therapy, and Re-evaluation.   PLAN FOR NEXT SESSION: add balance exercises, work on stairs and gait   3:12 PM, 01/19/23 Tereasa Coop, DPT Physical Therapy with [facility]

## 2023-01-24 ENCOUNTER — Ambulatory Visit: Payer: Medicare HMO | Admitting: Physical Therapy

## 2023-01-24 ENCOUNTER — Encounter: Payer: Self-pay | Admitting: Physical Therapy

## 2023-01-24 DIAGNOSIS — M6281 Muscle weakness (generalized): Secondary | ICD-10-CM | POA: Diagnosis not present

## 2023-01-24 DIAGNOSIS — M25572 Pain in left ankle and joints of left foot: Secondary | ICD-10-CM | POA: Diagnosis not present

## 2023-01-24 DIAGNOSIS — R262 Difficulty in walking, not elsewhere classified: Secondary | ICD-10-CM

## 2023-01-24 NOTE — Therapy (Signed)
OUTPATIENT PHYSICAL THERAPY LOWER EXTREMITY TREATMENT    Patient Name: Devin Howell MRN: [id number] DOB:11-28-1950, 72 y.o., male Today's Date: 01/24/2023  END OF SESSION:  PT End of Session - 01/24/23 1017     Visit Number 14    Number of Visits 20    Date for PT Re-Evaluation 01/31/23    Progress Note Due on Visit 18    PT Start Time 1018    PT Stop Time 1056    PT Time Calculation (min) 38 min    Activity Tolerance Patient tolerated treatment well    Behavior During Therapy WFL for tasks assessed/performed             Past Medical History:  Diagnosis Date   AKI (acute kidney injury) (HCC) 08/15/2020   Cataract    Chronic gout    CKD (chronic kidney disease)    pt. denies   Erectile dysfunction    GERD (gastroesophageal reflux disease)    Herpes simplex    Hx of adenomatous polyp of colon 04/12/2017   Hyperlipidemia    pt. denies   Hypertension    pt. denies   OSA on CPAP    Sleep apnea    cpap   Subarachnoid hematoma (HCC)    Subdural hematoma (HCC)    Testosterone deficiency    Past Surgical History:  Procedure Laterality Date   COLONOSCOPY     ESOPHAGOGASTRODUODENOSCOPY     TONSILLECTOMY AND ADENOIDECTOMY  1960   Patient Active Problem List   Diagnosis Date Noted   Chronic neck pain 02/04/2021   Alcohol dependence (HCC) 02/04/2021   Elevated blood pressure reading 01/19/2021   Obesity (BMI 30-39.9) 01/19/2021   Abnormal gait 12/24/2020   Bilateral lower extremity edema 12/24/2020   Thrombocytopenia (HCC) 09/08/2020   LFT elevation    Hypothyroidism 02/13/2019   Hx of adenomatous polyp of colon 04/12/2017   Anxiety and depression 03/01/2017   Erectile dysfunction 12/13/2016   Preventative health care 12/13/2016   Hiccups 06/10/2016   Chronic gout without tophus 04/20/2016   Genital herpes simplex 04/20/2016   Testosterone deficiency 04/20/2016   CKD (chronic kidney disease) stage 3, GFR 30-59 ml/min (HCC) 04/20/2016   Essential  hypertension 04/20/2016   Hyperlipidemia 04/20/2016    PCP: Doreene Nest, NP  REFERRING PROVIDER: Elinor Parkinson, DPM  REFERRING DIAG: [phone] (ICD-10-CM) - Tear of tibialis anterior tendon  THERAPY DIAG:  Pain in left ankle and joints of left foot  Muscle weakness (generalized)  Difficulty in walking, not elsewhere classified  Rationale for Evaluation and Treatment: Rehabilitation  ONSET DATE: DOS 10/15/22 LT FOOT PRIMARY REPAIR TIBIALIS ANTERIOR TENDON W/GRAFT W/CAST APPLICATION   SUBJECTIVE:   SUBJECTIVE STATEMENT: 01/24/2023 States he started his neighborhood walks with slight incline and decline and did about 20 minutes. States he is trying to walk further everyday. States he was focused on how he walked and improved mechanics and that seemed to help. States he was a little sore afterwards.   Eval: States he doesn't have any pain. States he has difficulties donning compression sock. Been good about non -weightbearing on his left leg. States that overall he feels he is doing well. Reports he originally hurt his foot 50 some years ago and he slipped on a side walk and broke his ankle and then wear and tear resulted in the tear. States prior to the surgery he was having difficulty with walking and he felt like his foot would just flop down.  PERTINENT HISTORY: HTN - controlled with medication PAIN:  Are you having pain? Yes: NPRS scale: 3/10 Pain location: back of the leg and hips Pain description: soreness Aggravating factors: motion Relieving factors: motion  PRECAUTIONS: None I am going to have him start with his partial weightbearing this week progressing to full weightbearing by the first of next week and then I will follow-up with him in 2 weeks.   RED FLAGS: None   WEIGHT BEARING RESTRICTIONS: Yes NWB in boot  FALLS:  Has patient fallen in last 6 months? Yes. Number of falls 1 time a few weeks back while in case  LIVING ENVIRONMENT: Lives with: lives  with their family Lives in: House/apartment Stairs: Yes: Internal: 12 steps; on left going up Has following equipment at home: Crutches and scooter  OCCUPATION: retired  PLOF: Independent  PATIENT GOALS: to be able to walk again and get back to function   NEXT MD VISIT: 11/30/22  OBJECTIVE:    PATIENT SURVEYS:  FOTO:  Initial : 45.2    COGNITION: Overall cognitive status: Within functional limits for tasks assessed     SENSATION: Not tested  EDEMA/Observation: Circumferential: at ankle L 29.0 cm, right 27.75 cm - incisions healed no wounds on bottom of foot     PALPATION: No tenderness to palpation along Forefoot    LE Measurements Lower Extremity Right EVAL Left EVAL   A/PROM MMT A/PROM MMT  Hip Flexion      Hip Extension      Hip Abduction      Hip Adduction      Hip Internal rotation      Hip External rotation      Knee Flexion      Knee Extension      Ankle Dorsiflexion      Ankle Plantarflexion      Ankle Inversion      Ankle Eversion       (Blank rows = not tested) * pain   LOWER EXTREMITY SPECIAL TESTS:  Negative Homans' sign  FUNCTIONAL TESTS:  Able to transition from sit to stand and stand to sit with 75% weight on R LE in shoe     TODAY'S TREATMENT:                                                                                                                              DATE:   01/24/2023 Therapeutic Exercise: Supine:     Seated:  DF with eversion standing - difficult - transitioned to sitting  with 4# - 5 minutes, DF 4# - 2x10 B 5-10 " holds   Standing: walking forwards and backwards and lateral 10 minutes, walking with 15# one arm at time x6 laps B, walking purposely 5 minutes    Neuromuscular Re-education:  Manual Therapy:  Therapeutic Activity: Self Care:  Trigger Point Dry Needling:  Modalities:    PATIENT EDUCATION:  Education details: on HEP Person educated: Patient Education method:  Explanation, Demonstration,  and Handouts Education comprehension: verbalized understanding   HOME EXERCISE PROGRAM: [id number]  ASSESSMENT:  CLINICAL IMPRESSION: 01/24/2023 Session focused on progressing walking mechanics and isolated DF.  Tolerated session well with verbal cues for reduced eversion in sitting and standing.  Added isolated dorsiflexion focus limited evaluation to home exercise program.  Overall patient is doing very well and increasing walking endurance and mechanics each day.  Will continue with current plan of care as tolerated.  Eval: Patient presents today after under going left tibialis anterior surgical repair on 10/15/2022.  Patient presents with significant swelling throughout lower extremity and incisions still healing.  Noted that patient has 2 small wounds on the ball of the foot and moisture between toes.  Session focused on education of wound care/incision care as well as lower extremity management.  Stressed continued focus of edema management and elevation above heart.  Discussed using compression garment but might need to transition to full size compression garment to below knee as current compression garment rests on one of his incision sites causing irritation.  Patient would greatly benefit from skilled physical therapy to improve overall function, range of motion, and ability to walk at home and in the community.  OBJECTIVE IMPAIRMENTS: Abnormal gait, decreased activity tolerance, decreased balance, decreased coordination, decreased knowledge of use of DME, decreased mobility, difficulty walking, decreased ROM, decreased strength, increased edema, improper body mechanics, postural dysfunction, and pain.   ACTIVITY LIMITATIONS: sitting, standing, squatting, stairs, transfers, and locomotion level  PARTICIPATION LIMITATIONS: meal prep, cleaning, and community activity  PERSONAL FACTORS: Age and Fitness are also affecting patient's functional outcome.   REHAB POTENTIAL: Good  CLINICAL  DECISION MAKING: Stable/uncomplicated  EVALUATION COMPLEXITY: Low   GOALS: Goals reviewed with patient? yes  SHORT TERM GOALS: Target date: 12/27/2022  Patient will be independent in self management strategies to improve quality of life and functional outcomes. Baseline: New Program Goal status: MET  2.  Patient will report at least 50% improvement in overall symptoms and/or function to demonstrate improved functional mobility Baseline: 0% better Goal status: MET  3.  Patient's surgical incisions and wounds on bottom of foot will be completely healed to reduce risk of infection. Baseline: Current open wounds on bottom of foot Goal status: MET  4.  Per MD protocol patient will be able to stand on left lower extremity for at least 30 seconds without pain to demonstrate improved weightbearing tolerance to left lower leg. Baseline: Currently unable and restricted Goal status: PROGRESSING- can in boot not in shoe    LONG TERM GOALS: Target date: 01/31/2023   Patient will report at least 75% improvement in overall symptoms and/or function to demonstrate improved functional mobility Baseline: 0% better Goal status: MET  2.  Patient will improve score on FOTO outcomes measure to projected score to demonstrate overall improved function and QOL Baseline: see above Goal status: PROGRESSING  3.  Per MD protocol patient will be able to ambulate in regular shoe without severe pain for at least 10 minutes to improve ability to walk around home and community. Baseline: unable Goal status: PROGRESSING  4.  Patient will be able to a send and descend stairs in home with step through gait and use of railing as needed to improve ability to go up and down the stairs in his own home Baseline: Unable Goal status: UNABLE per protocol    PLAN:  PT FREQUENCY: 2x/week  PT DURATION: 10 weeks  PLANNED INTERVENTIONS: Therapeutic exercises, Therapeutic activity, Neuromuscular re-education,  Balance  training, Gait training, Patient/Family education, Self Care, Joint mobilization, Joint manipulation, Stair training, Vestibular training, Canalith repositioning, Orthotic/Fit training, Prosthetic training, DME instructions, Aquatic Therapy, Dry Needling, Electrical stimulation, Spinal manipulation, Spinal mobilization, Cryotherapy, Moist heat, Taping, Traction, Ultrasound, Ionotophoresis 4mg /ml Dexamethasone, Manual therapy, and Re-evaluation.   PLAN FOR NEXT SESSION: add balance exercises, work on stairs and gait   10:59 AM, 01/24/23 Tereasa Coop, DPT Physical Therapy with [facility]

## 2023-01-26 ENCOUNTER — Ambulatory Visit: Payer: Medicare HMO | Admitting: Physical Therapy

## 2023-01-26 ENCOUNTER — Encounter: Payer: Self-pay | Admitting: Physical Therapy

## 2023-01-26 DIAGNOSIS — M6281 Muscle weakness (generalized): Secondary | ICD-10-CM

## 2023-01-26 DIAGNOSIS — R262 Difficulty in walking, not elsewhere classified: Secondary | ICD-10-CM | POA: Diagnosis not present

## 2023-01-26 DIAGNOSIS — M25572 Pain in left ankle and joints of left foot: Secondary | ICD-10-CM | POA: Diagnosis not present

## 2023-01-26 NOTE — Therapy (Addendum)
OUTPATIENT PHYSICAL THERAPY LOWER EXTREMITY TREATMENT   PHYSICAL THERAPY DISCHARGE SUMMARY  Visits from Start of Care: 15  Current functional level related to goals / functional outcomes: See below   Remaining deficits: See below   Education / Equipment: See below   Patient agrees to discharge. Patient goals were partially met. Patient is being discharged due to being pleased with the current functional level.  1:01 PM, 03/02/23 Tereasa Coop, DPT Physical Therapy with [place]   Patient Name: Devin Howell MRN: [id number] DOB:09-12-1950, 72 y.o., male Today's Date: 01/26/2023  END OF SESSION:  PT End of Session - 01/26/23 1433     Visit Number 15    Number of Visits 20    Date for PT Re-Evaluation 01/31/23    Progress Note Due on Visit 18    PT Start Time 1437    PT Stop Time 1504    PT Time Calculation (min) 27 min    Activity Tolerance Patient tolerated treatment well    Behavior During Therapy [facility] for tasks assessed/performed             Past Medical History:  Diagnosis Date   AKI (acute kidney injury) (HCC) 08/15/2020   Cataract    Chronic gout    CKD (chronic kidney disease)    pt. denies   Erectile dysfunction    GERD (gastroesophageal reflux disease)    Herpes simplex    Hx of adenomatous polyp of colon 04/12/2017   Hyperlipidemia    pt. denies   Hypertension    pt. denies   OSA on CPAP    Sleep apnea    cpap   Subarachnoid hematoma (HCC)    Subdural hematoma (HCC)    Testosterone deficiency    Past Surgical History:  Procedure Laterality Date   COLONOSCOPY     ESOPHAGOGASTRODUODENOSCOPY     TONSILLECTOMY AND ADENOIDECTOMY  1960   Patient Active Problem List   Diagnosis Date Noted   Chronic neck pain 02/04/2021   Alcohol dependence (HCC) 02/04/2021   Elevated blood pressure reading 01/19/2021   Obesity (BMI 30-39.9) 01/19/2021   Abnormal gait 12/24/2020   Bilateral lower extremity edema 12/24/2020   Thrombocytopenia (HCC)  09/08/2020   LFT elevation    Hypothyroidism 02/13/2019   Hx of adenomatous polyp of colon 04/12/2017   Anxiety and depression 03/01/2017   Erectile dysfunction 12/13/2016   Preventative health care 12/13/2016   Hiccups 06/10/2016   Chronic gout without tophus 04/20/2016   Genital herpes simplex 04/20/2016   Testosterone deficiency 04/20/2016   CKD (chronic kidney disease) stage 3, GFR 30-59 ml/min (HCC) 04/20/2016   Essential hypertension 04/20/2016   Hyperlipidemia 04/20/2016    PCP: Doreene Nest, NP  REFERRING PROVIDER: Elinor Parkinson, DPM  REFERRING DIAG: [phone] (ICD-10-CM) - Tear of tibialis anterior tendon  THERAPY DIAG:  Pain in left ankle and joints of left foot  Muscle weakness (generalized)  Difficulty in walking, not elsewhere classified  Rationale for Evaluation and Treatment: Rehabilitation  ONSET DATE: DOS 10/15/22 LT FOOT PRIMARY REPAIR TIBIALIS ANTERIOR TENDON W/GRAFT W/CAST APPLICATION   SUBJECTIVE:   SUBJECTIVE STATEMENT: 01/26/2023  States overall he feels 70% better since the start of PT. States that he is still unable to do everything without fatigue or balance issues.  Eval: States he doesn't have any pain. States he has difficulties donning compression sock. Been good about non -weightbearing on his left leg. States that overall he feels he is doing well. Reports he originally  hurt his foot 50 some years ago and he slipped on a side walk and broke his ankle and then wear and tear resulted in the tear. States prior to the surgery he was having difficulty with walking and he felt like his foot would just flop down.    PERTINENT HISTORY: HTN - controlled with medication PAIN:  Are you having pain? Yes: NPRS scale: 0/10 Pain location: back of the leg and hips Pain description: soreness Aggravating factors: motion Relieving factors: motion  PRECAUTIONS: None I am going to have him start with his partial weightbearing this week progressing to  full weightbearing by the first of next week and then I will follow-up with him in 2 weeks.   RED FLAGS: None   WEIGHT BEARING RESTRICTIONS: Yes NWB in boot  FALLS:  Has patient fallen in last 6 months? Yes. Number of falls 1 time a few weeks back while in case  LIVING ENVIRONMENT: Lives with: lives with their family Lives in: House/apartment Stairs: Yes: Internal: 12 steps; on left going up Has following equipment at home: Crutches and scooter  OCCUPATION: retired  PLOF: Independent  PATIENT GOALS: to be able to walk again and get back to function   NEXT MD VISIT: 02/10/23  OBJECTIVE:    PATIENT SURVEYS:  FOTO:  Initial : 45.2   01/26/23 73%   COGNITION: Overall cognitive status: Within functional limits for tasks assessed     SENSATION: Not tested     PALPATION: No tenderness to palpation along Forefoot    LE Measurements Unable to perform SL heel raise on either leg 10/30    LOWER EXTREMITY SPECIAL TESTS:  Negative Homans' sign  FUNCTIONAL TESTS:  SLS occ. UE assist L 30" and no assist R    TODAY'S TREATMENT:                                                                                                                              DATE:   01/26/2023 Therapeutic Exercise: Objective measures updated Reviewed entire HEP    Neuromuscular Re-education:  Manual Therapy:  Therapeutic Activity: Self Care:  Trigger Point Dry Needling:  Modalities:    PATIENT EDUCATION:  Education details: on HEP, on FOTO score, on progress made Person educated: Patient Education method: Explanation, Demonstration, and Handouts Education comprehension: verbalized understanding   HOME EXERCISE PROGRAM: [id number]  ASSESSMENT:  CLINICAL IMPRESSION: 01/26/2023 All but 1 goal met at this time.  Overall patient doing very well and is able to perform daily functional tasks without pain or discomfort.  Improving in distance and able to walk 30 minutes yesterday  without difficulties or weakness.  Reviewed entire home exercise program and answered all questions about current presentation and progressions of interventions moving forward.  Placing patient on hold until MD appointment in November, but anticipate discharge following MD appointment and patient presentation.  Eval: Patient presents today after under going left tibialis anterior surgical repair on 10/15/2022.  Patient presents with significant swelling throughout lower extremity and incisions still healing.  Noted that patient has 2 small wounds on the ball of the foot and moisture between toes.  Session focused on education of wound care/incision care as well as lower extremity management.  Stressed continued focus of edema management and elevation above heart.  Discussed using compression garment but might need to transition to full size compression garment to below knee as current compression garment rests on one of his incision sites causing irritation.  Patient would greatly benefit from skilled physical therapy to improve overall function, range of motion, and ability to walk at home and in the community.  OBJECTIVE IMPAIRMENTS: Abnormal gait, decreased activity tolerance, decreased balance, decreased coordination, decreased knowledge of use of DME, decreased mobility, difficulty walking, decreased ROM, decreased strength, increased edema, improper body mechanics, postural dysfunction, and pain.   ACTIVITY LIMITATIONS: sitting, standing, squatting, stairs, transfers, and locomotion level  PARTICIPATION LIMITATIONS: meal prep, cleaning, and community activity  PERSONAL FACTORS: Age and Fitness are also affecting patient's functional outcome.   REHAB POTENTIAL: Good  CLINICAL DECISION MAKING: Stable/uncomplicated  EVALUATION COMPLEXITY: Low   GOALS: Goals reviewed with patient? yes  SHORT TERM GOALS: Target date: 12/27/2022  Patient will be independent in self management strategies to  improve quality of life and functional outcomes. Baseline: New Program Goal status: MET  2.  Patient will report at least 50% improvement in overall symptoms and/or function to demonstrate improved functional mobility Baseline: 0% better Goal status: MET  3.  Patient's surgical incisions and wounds on bottom of foot will be completely healed to reduce risk of infection. Baseline: Current open wounds on bottom of foot Goal status: MET  4.  Per MD protocol patient will be able to stand on left lower extremity for at least 30 seconds without pain to demonstrate improved weightbearing tolerance to left lower leg. Baseline: Currently unable and restricted Goal status: MET    LONG TERM GOALS: Target date: 01/31/2023   Patient will report at least 75% improvement in overall symptoms and/or function to demonstrate improved functional mobility Baseline: 0% better Goal status: PROGRESSING   2.  Patient will improve score on FOTO outcomes measure to projected score to demonstrate overall improved function and QOL Baseline: see above Goal status: MET  3.  Per MD protocol patient will be able to ambulate in regular shoe without severe pain for at least 10 minutes to improve ability to walk around home and community. Baseline: unable Goal status: MET  4.  Patient will be able to a send and descend stairs in home with step through gait and use of railing as needed to improve ability to go up and down the stairs in his own home Baseline: Unable Goal status: MET    PLAN:  PT FREQUENCY: 2x/week  PT DURATION: 10 weeks  PLANNED INTERVENTIONS: Therapeutic exercises, Therapeutic activity, Neuromuscular re-education, Balance training, Gait training, Patient/Family education, Self Care, Joint mobilization, Joint manipulation, Stair training, Vestibular training, Canalith repositioning, Orthotic/Fit training, Prosthetic training, DME instructions, Aquatic Therapy, Dry Needling, Electrical  stimulation, Spinal manipulation, Spinal mobilization, Cryotherapy, Moist heat, Taping, Traction, Ultrasound, Ionotophoresis 4mg /ml Dexamethasone, Manual therapy, and Re-evaluation.   PLAN FOR NEXT SESSION: on hold until MD apt.- anticipate DC    3:09 PM, 01/26/23 Tereasa Coop, DPT Physical Therapy with [facility]

## 2023-01-30 ENCOUNTER — Other Ambulatory Visit: Payer: Self-pay | Admitting: Primary Care

## 2023-01-30 DIAGNOSIS — F32A Depression, unspecified: Secondary | ICD-10-CM

## 2023-01-30 DIAGNOSIS — E039 Hypothyroidism, unspecified: Secondary | ICD-10-CM

## 2023-02-10 ENCOUNTER — Encounter: Payer: Self-pay | Admitting: Podiatry

## 2023-02-10 ENCOUNTER — Ambulatory Visit: Payer: Medicare HMO | Admitting: Podiatry

## 2023-02-10 DIAGNOSIS — S86219A Strain of muscle(s) and tendon(s) of anterior muscle group at lower leg level, unspecified leg, initial encounter: Secondary | ICD-10-CM

## 2023-02-10 DIAGNOSIS — Z9889 Other specified postprocedural states: Secondary | ICD-10-CM

## 2023-02-10 NOTE — Progress Notes (Signed)
He presents today date of surgery 10/15/2022.  Left foot primary repair tibialis anterior tendon with graft and cast.  States that "it feels really good".  He has completed physical therapy states that he no longer has a dropfoot of his left foot he is very happy at this point.  States that he is able to do what he wants to do regularly.  Objective: Vitals are stable and at times it is no erythema edema cellulitis drainage or odor he has great range of motion with dorsiflexion and inversion of the foot against resistance with no pain.  Incision has gone on to heal uneventfully no signs of infection.  Assessment: Well-healing surgical foot.  Plan: Follow-up with me on an as-needed basis

## 2023-03-02 DIAGNOSIS — N1832 Chronic kidney disease, stage 3b: Secondary | ICD-10-CM | POA: Diagnosis not present

## 2023-03-04 ENCOUNTER — Ambulatory Visit (INDEPENDENT_AMBULATORY_CARE_PROVIDER_SITE_OTHER): Payer: Medicare HMO | Admitting: Primary Care

## 2023-03-04 ENCOUNTER — Encounter: Payer: Self-pay | Admitting: Primary Care

## 2023-03-04 VITALS — BP 148/86 | HR 65 | Temp 97.6°F | Ht 68.0 in | Wt 244.0 lb

## 2023-03-04 DIAGNOSIS — M1A9XX0 Chronic gout, unspecified, without tophus (tophi): Secondary | ICD-10-CM | POA: Diagnosis not present

## 2023-03-04 DIAGNOSIS — A6000 Herpesviral infection of urogenital system, unspecified: Secondary | ICD-10-CM | POA: Diagnosis not present

## 2023-03-04 DIAGNOSIS — I10 Essential (primary) hypertension: Secondary | ICD-10-CM | POA: Diagnosis not present

## 2023-03-04 DIAGNOSIS — Z125 Encounter for screening for malignant neoplasm of prostate: Secondary | ICD-10-CM

## 2023-03-04 DIAGNOSIS — N183 Chronic kidney disease, stage 3 unspecified: Secondary | ICD-10-CM

## 2023-03-04 DIAGNOSIS — E039 Hypothyroidism, unspecified: Secondary | ICD-10-CM | POA: Diagnosis not present

## 2023-03-04 DIAGNOSIS — E782 Mixed hyperlipidemia: Secondary | ICD-10-CM | POA: Diagnosis not present

## 2023-03-04 DIAGNOSIS — Z0000 Encounter for general adult medical examination without abnormal findings: Secondary | ICD-10-CM

## 2023-03-04 DIAGNOSIS — E349 Endocrine disorder, unspecified: Secondary | ICD-10-CM

## 2023-03-04 DIAGNOSIS — F419 Anxiety disorder, unspecified: Secondary | ICD-10-CM

## 2023-03-04 DIAGNOSIS — F1020 Alcohol dependence, uncomplicated: Secondary | ICD-10-CM

## 2023-03-04 DIAGNOSIS — F32A Depression, unspecified: Secondary | ICD-10-CM

## 2023-03-04 LAB — LIPID PANEL
Cholesterol: 168 mg/dL (ref 0–200)
HDL: 68.6 mg/dL (ref >39.00)
LDL Cholesterol: 85 mg/dL (ref 0–99)
NonHDL: 98.93
Total CHOL/HDL Ratio: 2
Triglycerides: 71 mg/dL (ref 0.0–149.0)
VLDL: 14.2 mg/dL (ref 0.0–40.0)

## 2023-03-04 LAB — TSH: TSH: 3.54 u[IU]/mL (ref 0.35–5.50)

## 2023-03-04 LAB — URIC ACID: Uric Acid, Serum: 3.6 mg/dL — ABNORMAL LOW (ref 4.0–7.8)

## 2023-03-04 LAB — HEMOGLOBIN A1C: Hgb A1c MFr Bld: 5 % (ref 4.6–6.5)

## 2023-03-04 LAB — PSA, MEDICARE: PSA: 0.83 ng/mL (ref 0.10–4.00)

## 2023-03-04 NOTE — Assessment & Plan Note (Signed)
He is taking levothyroxine correctly. Continue levothyroxine 50 mcg daily.   Repeat TSH pending.

## 2023-03-04 NOTE — Assessment & Plan Note (Signed)
Improved.  Continue to cut back on alcohol consumption.

## 2023-03-04 NOTE — Patient Instructions (Signed)
Stop by the lab prior to leaving today. I will notify you of your results once received.   It was a pleasure to see you today!  

## 2023-03-04 NOTE — Assessment & Plan Note (Signed)
Above goal today, improved on recheck.  Continue hydralazine 25 mg twice daily and metoprolol succinate 50 mg daily per nephrology. Recommended he discuss blood pressure readings with his nephrologist at upcoming visit.

## 2023-03-04 NOTE — Progress Notes (Signed)
Subjective:    Patient ID: Devin Howell, male    DOB: 15-Feb-1951, 72 y.o.   MRN: [id number]  HPI  Devin Howell is a very pleasant 72 y.o. male who presents today for complete physical and follow up of chronic conditions.  Immunizations: -Tetanus: Completed in 2018 -Influenza: Completed the season -Shingles: Completed Shingrix series -Pneumonia: Completed Prevnar 20 in 2022, Pneumovax in 2018, Prevnar 13 in 2016  Diet: Fair diet.  Exercise: No regular exercise.  Eye exam: Completes annually  Dental exam: Completes semi-annually    Colonoscopy: Completed in 2019, due 2026, see new letter in chart from GI.  PSA: Due   BP Readings from Last 3 Encounters:  03/04/23 (!) 148/86  04/01/22 (!) 145/72  03/02/22 (!) 162/86    He is checking his BP at home which runs about 140/80. Following with nephrology. He was managed on amlodipine but this caused ankle edema.   Wt Readings from Last 3 Encounters:  03/04/23 244 lb (110.7 kg)  09/09/22 225 lb (102.1 kg)  03/02/22 236 lb (107 kg)      Review of Systems  Constitutional:  Negative for unexpected weight change.  HENT:  Negative for rhinorrhea.   Respiratory:  Negative for cough and shortness of breath.   Cardiovascular:  Negative for chest pain.  Gastrointestinal:  Negative for constipation and diarrhea.  Genitourinary:  Negative for difficulty urinating.  Musculoskeletal:  Positive for arthralgias.  Skin:  Negative for rash.  Allergic/Immunologic: Negative for environmental allergies.  Neurological:  Negative for dizziness, numbness and headaches.  Psychiatric/Behavioral:  The patient is not nervous/anxious.          Past Medical History:  Diagnosis Date   Abnormal gait 12/24/2020   AKI (acute kidney injury) (HCC) 08/15/2020   Cataract    Chronic gout    CKD (chronic kidney disease)    pt. denies   Erectile dysfunction    GERD (gastroesophageal reflux disease)    Herpes simplex    Hx of adenomatous  polyp of colon 04/12/2017   Hyperlipidemia    pt. denies   Hypertension    pt. denies   OSA on CPAP    Sleep apnea    cpap   Subarachnoid hematoma (HCC)    Subdural hematoma (HCC)    Testosterone deficiency     Social History   Socioeconomic History   Marital status: Married    Spouse name: Not on file   Number of children: Not on file   Years of education: Not on file   Highest education level: Bachelor's degree (e.g., BA, AB, BS)  Occupational History   Not on file  Tobacco Use   Smoking status: Never    Passive exposure: Never   Smokeless tobacco: Never  Vaping Use   Vaping status: Never Used  Substance and Sexual Activity   Alcohol use: Not Currently    Alcohol/week: 2.0 standard drinks of alcohol    Types: 2 Glasses of wine per week   Drug use: No   Sexual activity: Not Currently  Other Topics Concern   Not on file  Social History Narrative   Lives with wife at home    Right handed    Some caffeine intake    Social Determinants of Health   Financial Resource Strain: Low Risk  (02/28/2023)   Overall Financial Resource Strain (CARDIA)    Difficulty of Paying Living Expenses: Not hard at all  Food Insecurity: No Food Insecurity (02/28/2023)   Hunger  Vital Sign    Worried About Programme researcher, broadcasting/film/video in the Last Year: Never true    Ran Out of Food in the Last Year: Never true  Transportation Needs: No Transportation Needs (02/28/2023)   PRAPARE - Administrator, Civil Service (Medical): No    Lack of Transportation (Non-Medical): No  Physical Activity: Sufficiently Active (02/28/2023)   Exercise Vital Sign    Days of Exercise per Week: 4 days    Minutes of Exercise per Session: 40 min  Stress: No Stress Concern Present (02/28/2023)   [employer] of Occupational Health - Occupational Stress Questionnaire    Feeling of Stress : Only a little  Social Connections: Moderately Integrated (02/28/2023)   Social Connection and Isolation Panel [NHANES]     Frequency of Communication with Friends and Family: Three times a week    Frequency of Social Gatherings with Friends and Family: Once a week    Attends Religious Services: 1 to 4 times per year    Active Member of [employer] or Organizations: No    Attends Banker Meetings: Never    Marital Status: Married  Catering manager Violence: Not At Risk (09/09/2022)   Humiliation, Afraid, Rape, and Kick questionnaire    Fear of Current or Ex-Partner: No    Emotionally Abused: No    Physically Abused: No    Sexually Abused: No    Past Surgical History:  Procedure Laterality Date   COLONOSCOPY     ESOPHAGOGASTRODUODENOSCOPY     TENDON REPAIR Left    TONSILLECTOMY AND ADENOIDECTOMY  1960    Family History  Problem Relation Age of Onset   Rectal cancer Mother    Heart Problems Father    Colon polyps Neg Hx    Esophageal cancer Neg Hx    Colon cancer Neg Hx    Stomach cancer Neg Hx     Allergies  Allergen Reactions   Valsartan-Hydrochlorothiazide     Other Reaction(s): creatinine elevation    Current Outpatient Medications on File Prior to Visit  Medication Sig Dispense Refill   allopurinol (ZYLOPRIM) 300 MG tablet TAKE 1 TABLET BY MOUTH EVERY DAY FOR GOUT PREVENTION 90 tablet 0   atorvastatin (LIPITOR) 20 MG tablet TAKE 1 TABLET BY MOUTH EVERY DAY FOR CHOLESTEROL 90 tablet 0   citalopram (CELEXA) 20 MG tablet TAKE 1 TABLET (20 MG TOTAL) BY MOUTH DAILY. FOR ANXIETY AND DEPRESSION. 90 tablet 3   COMIRNATY syringe      hydrALAZINE (APRESOLINE) 25 MG tablet Take 25 mg by mouth 2 (two) times daily.     levothyroxine (SYNTHROID) 50 MCG tablet TAKE 1 TAB BY MOUTH EVERY MORNING ON EMPTY STOMACH WITH WATER ONLY NO FOOD/OTHER MEDS FOR 30 MINS 90 tablet 0   metoprolol succinate (TOPROL-XL) 50 MG 24 hr tablet Take 50 mg by mouth daily.     testosterone cypionate (DEPOTESTOSTERONE CYPIONATE) 200 MG/ML injection Inject 100 mg into the muscle once a week.     valACYclovir (VALTREX) 1000  MG tablet 2 tablets Orally every 12 hrs for 2  doses as needed     No current facility-administered medications on file prior to visit.    BP (!) 148/86   Pulse 65   Temp 97.6 F (36.4 C) (Temporal)   Ht 5\' 8"  (1.727 m)   Wt 244 lb (110.7 kg)   SpO2 97%   BMI 37.10 kg/m  Objective:   Physical Exam HENT:     Right Ear: Tympanic  membrane and ear canal normal.     Left Ear: Tympanic membrane and ear canal normal.  Eyes:     Pupils: Pupils are equal, round, and reactive to light.  Cardiovascular:     Rate and Rhythm: Normal rate and regular rhythm.  Pulmonary:     Effort: Pulmonary effort is normal.     Breath sounds: Normal breath sounds.  Abdominal:     General: Bowel sounds are normal.     Palpations: Abdomen is soft.     Tenderness: There is no abdominal tenderness.  Musculoskeletal:        General: Normal range of motion.     Cervical back: Neck supple.  Skin:    General: Skin is warm and dry.  Neurological:     Mental Status: He is alert and oriented to person, place, and time.     Cranial Nerves: No cranial nerve deficit.     Deep Tendon Reflexes:     Reflex Scores:      Patellar reflexes are 2+ on the right side and 2+ on the left side. Psychiatric:        Mood and Affect: Mood normal.           Assessment & Plan:  Preventative health care Assessment & Plan: Immunizations UTD. Colonoscopy UTD, due 2026, see updated letter in chart PSA due and pending.  Discussed the importance of a healthy diet and regular exercise in order for weight loss, and to reduce the risk of further co-morbidity.  Exam stable. Labs pending.  Follow up in 1 year for repeat physical.    Hypothyroidism, unspecified type Assessment & Plan: He is taking levothyroxine correctly. Continue levothyroxine 50 mcg daily.   Repeat TSH pending.  Orders: -     TSH  Stage 3 chronic kidney disease, unspecified whether stage 3a or 3b CKD (HCC) Assessment & Plan: Following with  nephrology.  Labs reviewed from August 2024 from [facility].   Genital herpes simplex, unspecified site Assessment & Plan: Controlled. No flares in years.  Continue Valtrex 2000 mg twice daily x 1 day as needed.   Uncomplicated alcohol dependence (HCC) Assessment & Plan: Improved.  Continue to cut back on alcohol consumption.   Anxiety and depression Assessment & Plan: Controlled.  Continue citalopram 20 mg daily.   Chronic gout without tophus, unspecified cause, unspecified site Assessment & Plan: No gout flares in years.  Continue allopurinol 300 mg daily for prevention.  Orders: -     Uric acid  Mixed hyperlipidemia Assessment & Plan: Repeat lipid panel pending.  Continue atorvastatin 20 mg daily.  Orders: -     Lipid panel -     Hemoglobin A1c  Testosterone deficiency Assessment & Plan: Following with urology. Continue testosterone replacement.   Screening for prostate cancer -     PSA, Medicare  Essential hypertension Assessment & Plan: Above goal today, improved on recheck.  Continue hydralazine 25 mg twice daily and metoprolol succinate 50 mg daily per nephrology. Recommended he discuss blood pressure readings with his nephrologist at upcoming visit.         Doreene Nest, NP

## 2023-03-04 NOTE — Assessment & Plan Note (Signed)
Following with nephrology.  Labs reviewed from August 2024 from [facility].

## 2023-03-04 NOTE — Assessment & Plan Note (Signed)
No gout flares in years.  Continue allopurinol 300 mg daily for prevention.

## 2023-03-04 NOTE — Assessment & Plan Note (Signed)
Controlled.  Continue citalopram 20 mg daily.

## 2023-03-04 NOTE — Assessment & Plan Note (Signed)
Controlled. No flares in years.  Continue Valtrex 2000 mg twice daily x 1 day as needed.

## 2023-03-04 NOTE — Assessment & Plan Note (Signed)
Immunizations UTD. Colonoscopy UTD, due 2026, see updated letter in chart PSA due and pending.  Discussed the importance of a healthy diet and regular exercise in order for weight loss, and to reduce the risk of further co-morbidity.  Exam stable. Labs pending.  Follow up in 1 year for repeat physical.

## 2023-03-04 NOTE — Assessment & Plan Note (Signed)
Following with urology. Continue testosterone replacement.

## 2023-03-04 NOTE — Assessment & Plan Note (Signed)
 Repeat lipid panel pending.  Continue atorvastatin 20 mg daily. 

## 2023-03-09 DIAGNOSIS — R3911 Hesitancy of micturition: Secondary | ICD-10-CM | POA: Diagnosis not present

## 2023-03-09 DIAGNOSIS — N1832 Chronic kidney disease, stage 3b: Secondary | ICD-10-CM | POA: Diagnosis not present

## 2023-03-09 DIAGNOSIS — I129 Hypertensive chronic kidney disease with stage 1 through stage 4 chronic kidney disease, or unspecified chronic kidney disease: Secondary | ICD-10-CM | POA: Diagnosis not present

## 2023-03-09 DIAGNOSIS — N200 Calculus of kidney: Secondary | ICD-10-CM | POA: Diagnosis not present

## 2023-03-09 DIAGNOSIS — R809 Proteinuria, unspecified: Secondary | ICD-10-CM | POA: Diagnosis not present

## 2023-03-14 DIAGNOSIS — R972 Elevated prostate specific antigen [PSA]: Secondary | ICD-10-CM | POA: Diagnosis not present

## 2023-03-14 DIAGNOSIS — E291 Testicular hypofunction: Secondary | ICD-10-CM | POA: Diagnosis not present

## 2023-03-14 DIAGNOSIS — R3912 Poor urinary stream: Secondary | ICD-10-CM | POA: Diagnosis not present

## 2023-03-14 DIAGNOSIS — N401 Enlarged prostate with lower urinary tract symptoms: Secondary | ICD-10-CM | POA: Diagnosis not present

## 2023-03-14 DIAGNOSIS — R351 Nocturia: Secondary | ICD-10-CM | POA: Diagnosis not present

## 2023-03-30 ENCOUNTER — Other Ambulatory Visit: Payer: Self-pay | Admitting: Primary Care

## 2023-03-30 DIAGNOSIS — M1A9XX0 Chronic gout, unspecified, without tophus (tophi): Secondary | ICD-10-CM

## 2023-03-30 DIAGNOSIS — F32A Depression, unspecified: Secondary | ICD-10-CM

## 2023-04-09 ENCOUNTER — Other Ambulatory Visit: Payer: Self-pay | Admitting: Primary Care

## 2023-04-09 DIAGNOSIS — E782 Mixed hyperlipidemia: Secondary | ICD-10-CM

## 2023-04-14 DIAGNOSIS — G4733 Obstructive sleep apnea (adult) (pediatric): Secondary | ICD-10-CM | POA: Diagnosis not present

## 2023-04-14 DIAGNOSIS — R030 Elevated blood-pressure reading, without diagnosis of hypertension: Secondary | ICD-10-CM | POA: Diagnosis not present

## 2023-04-27 ENCOUNTER — Other Ambulatory Visit: Payer: Self-pay | Admitting: Primary Care

## 2023-04-27 DIAGNOSIS — E039 Hypothyroidism, unspecified: Secondary | ICD-10-CM

## 2023-06-29 DIAGNOSIS — N1832 Chronic kidney disease, stage 3b: Secondary | ICD-10-CM | POA: Diagnosis not present

## 2023-07-07 ENCOUNTER — Other Ambulatory Visit: Payer: Self-pay | Admitting: Nephrology

## 2023-07-07 DIAGNOSIS — N200 Calculus of kidney: Secondary | ICD-10-CM | POA: Diagnosis not present

## 2023-07-07 DIAGNOSIS — N1832 Chronic kidney disease, stage 3b: Secondary | ICD-10-CM | POA: Diagnosis not present

## 2023-07-07 DIAGNOSIS — R3911 Hesitancy of micturition: Secondary | ICD-10-CM | POA: Diagnosis not present

## 2023-07-07 DIAGNOSIS — R809 Proteinuria, unspecified: Secondary | ICD-10-CM | POA: Diagnosis not present

## 2023-07-07 DIAGNOSIS — I129 Hypertensive chronic kidney disease with stage 1 through stage 4 chronic kidney disease, or unspecified chronic kidney disease: Secondary | ICD-10-CM | POA: Diagnosis not present

## 2023-07-14 ENCOUNTER — Ambulatory Visit
Admission: RE | Admit: 2023-07-14 | Discharge: 2023-07-14 | Disposition: A | Discharge status: Home / Self Care | Source: Ambulatory Visit | Attending: Nephrology | Admitting: Nephrology

## 2023-07-14 DIAGNOSIS — N1832 Chronic kidney disease, stage 3b: Secondary | ICD-10-CM

## 2023-09-12 ENCOUNTER — Ambulatory Visit (INDEPENDENT_AMBULATORY_CARE_PROVIDER_SITE_OTHER)

## 2023-09-12 VITALS — Ht 68.0 in | Wt 240.0 lb

## 2023-09-12 DIAGNOSIS — Z0000 Encounter for general adult medical examination without abnormal findings: Secondary | ICD-10-CM | POA: Diagnosis not present

## 2023-09-12 NOTE — Patient Instructions (Signed)
 Mr. Devin Howell , Thank you for taking time out of your busy schedule to complete your Annual Wellness Visit with me. I enjoyed our conversation and look forward to speaking with you again next year. I, as well as your care team,  appreciate your ongoing commitment to your health goals. Please review the following plan we discussed and let me know if I can assist you in the future. Your Game plan/ To Do List     Follow up Visits: Next Medicare AWV with our clinical staff: 09/15/23 @ 8:10am televisit   Have you seen your provider in the last 6 months (3 months if uncontrolled diabetes)? Yes Next Office Visit with your provider: to be scheduled in Dec for CPE  Clinician Recommendations:  Aim for 30 minutes of exercise or brisk walking, 6-8 glasses of water, and 5 servings of fruits and vegetables each day.       This is a list of the screening recommended for you and due dates:  Health Maintenance  Topic Date Due   Flu Shot  10/28/2023   Colon Cancer Screening  04/08/2024   Medicare Annual Wellness Visit  09/11/2024   DTaP/Tdap/Td vaccine (2 - Tdap) 12/17/2026   Pneumococcal Vaccine for age over 59  Completed   Hepatitis C Screening  Completed   Zoster (Shingles) Vaccine  Completed   HPV Vaccine  Aged Out   Meningitis B Vaccine  Aged Out   COVID-19 Vaccine  Discontinued    Advanced directives: (Copy Requested) Please bring a copy of your health care power of attorney and living will to the office to be added to your chart at your convenience. You can mail to [facility] [address] [place], [place] [address] or email to [email or web address] Advance Care Planning is important because it:  [x]  Makes sure you receive the medical care that is consistent with your values, goals, and preferences  [x]  It provides guidance to your family and loved ones and reduces their decisional burden about whether or not they are making the right decisions based on your  wishes.  Follow the link provided in your after visit summary or read over the paperwork we have mailed to you to help you started getting your Advance Directives in place. If you need assistance in completing these, please reach out to us  so that we can help you!

## 2023-09-12 NOTE — Progress Notes (Signed)
 Please attest and cosign this visit due to patients primary care provider not being in the office at the time the visit was completed.    Subjective:   Devin Howell is a 73 y.o. who presents for a Medicare Wellness preventive visit.  As a reminder, Annual Wellness Visits don't include a physical exam, and some assessments may be limited, especially if this visit is performed virtually. We may recommend an in-person follow-up visit with your provider if needed.  Visit Complete: Virtual I connected with  Devin Howell on 09/12/23 by a audio enabled telemedicine application and verified that I am speaking with the correct person using two identifiers.  Patient Location: Home  Provider Location: Home Office  I discussed the limitations of evaluation and management by telemedicine. The patient expressed understanding and agreed to proceed.  Vital Signs: Because this visit was a virtual/telehealth visit, some criteria may be missing or patient reported. Any vitals not documented were not able to be obtained and vitals that have been documented are patient reported.  VideoDeclined- This patient declined Librarian, academic. Therefore the visit was completed with audio only.  Persons Participating in Visit: Patient.  AWV Questionnaire: Yes: Patient Medicare AWV questionnaire was completed by the patient on 09/08/23; I have confirmed that all information answered by patient is correct and no changes since this date.  Cardiac Risk Factors include: advanced age (>14men, >15 women);dyslipidemia;hypertension;male gender;obesity (BMI >30kg/m2)     Objective:    Today's Vitals   09/12/23 0831  Weight: 240 lb (108.9 kg)  Height: 5' 8 (1.727 m)   Body mass index is 36.49 kg/m.     09/12/2023    8:38 AM 09/09/2022    8:49 AM 09/16/2021    8:42 AM 09/15/2020    9:05 AM 08/15/2020    8:05 PM 08/15/2020    1:51 PM 02/08/2019    2:22 PM  Advanced Directives   Does Patient Have a Medical Advance Directive? Yes Yes No Yes  No Yes  Type of Estate agent of [place];Living will Healthcare Power of [place];Living will  Healthcare Power of [place];Living will   Healthcare Power of [place];Living will  Copy of Healthcare Power of Attorney in Chart? No - copy requested No - copy requested  No - copy requested   No - copy requested  Would patient like information on creating a medical advance directive?   No - Patient declined  No - Patient declined      Current Medications (verified) Outpatient Encounter Medications as of 09/12/2023  Medication Sig   allopurinol  (ZYLOPRIM ) 300 MG tablet TAKE 1 TABLET BY MOUTH EVERY DAY FOR GOUT PREVENTION   atorvastatin  (LIPITOR) 20 MG tablet TAKE 1 TABLET BY MOUTH EVERY DAY FOR CHOLESTEROL   citalopram  (CELEXA ) 20 MG tablet TAKE 1 TABLET (20 MG TOTAL) BY MOUTH DAILY. FOR ANXIETY AND DEPRESSION.   COMIRNATY syringe    hydrALAZINE  (APRESOLINE ) 25 MG tablet Take 25 mg by mouth 2 (two) times daily.   levothyroxine  (SYNTHROID ) 50 MCG tablet TAKE 1 TAB BY MOUTH EVERY MORNING ON EMPTY STOMACH WITH WATER ONLY NO FOOD/OTHER MEDS FOR 30 MINS   metoprolol  succinate (TOPROL -XL) 50 MG 24 hr tablet Take 50 mg by mouth daily.   testosterone  cypionate (DEPOTESTOSTERONE CYPIONATE) 200 MG/ML injection Inject 100 mg into the muscle once a week.   valACYclovir  (VALTREX ) 1000 MG tablet 2 tablets Orally every 12 hrs for 2  doses as needed   No facility-administered encounter  medications on file as of 09/12/2023.    Allergies (verified) Valsartan -hydrochlorothiazide   History: Past Medical History:  Diagnosis Date   Abnormal gait 12/24/2020   AKI (acute kidney injury) (HCC) 08/15/2020   Cataract    Chronic gout    CKD (chronic kidney disease)    pt. denies   Depression 2000   Erectile dysfunction    GERD (gastroesophageal reflux disease)    Herpes simplex    Hx of adenomatous polyp of colon 04/12/2017    Hyperlipidemia    pt. denies   Hypertension    pt. denies   OSA on CPAP    Sleep apnea    cpap   Subarachnoid hematoma (HCC)    Subdural hematoma (HCC)    Testosterone  deficiency    Past Surgical History:  Procedure Laterality Date   COLONOSCOPY     ESOPHAGOGASTRODUODENOSCOPY     EYE SURGERY  2020   cataracts   TENDON REPAIR Left    TONSILLECTOMY AND ADENOIDECTOMY  1960   Family History  Problem Relation Age of Onset   Rectal cancer Mother    Cancer Mother    Heart Problems Father    Heart disease Father    Colon polyps Neg Hx    Esophageal cancer Neg Hx    Colon cancer Neg Hx    Stomach cancer Neg Hx    Social History   Socioeconomic History   Marital status: Married    Spouse name: Not on file   Number of children: Not on file   Years of education: Not on file   Highest education level: Bachelor's degree (e.g., BA, AB, BS)  Occupational History   Not on file  Tobacco Use   Smoking status: Never    Passive exposure: Never   Smokeless tobacco: Never  Vaping Use   Vaping status: Never Used  Substance and Sexual Activity   Alcohol use: Not Currently    Alcohol/week: 2.0 standard drinks of alcohol    Types: 2 Glasses of wine per week   Drug use: No   Sexual activity: Not Currently  Other Topics Concern   Not on file  Social History Narrative   Lives with wife at home    Right handed    Some caffeine intake    Social Drivers of Health   Financial Resource Strain: Low Risk  (09/08/2023)   Overall Financial Resource Strain (CARDIA)    Difficulty of Paying Living Expenses: Not hard at all  Food Insecurity: No Food Insecurity (09/08/2023)   Hunger Vital Sign    Worried About Running Out of Food in the Last Year: Never true    Ran Out of Food in the Last Year: Never true  Transportation Needs: No Transportation Needs (09/08/2023)   PRAPARE - Administrator, Civil Service (Medical): No    Lack of Transportation (Non-Medical): No  Physical  Activity: Sufficiently Active (09/08/2023)   Exercise Vital Sign    Days of Exercise per Week: 4 days    Minutes of Exercise per Session: 40 min  Stress: No Stress Concern Present (09/08/2023)   [employer] of Occupational Health - Occupational Stress Questionnaire    Feeling of Stress: Only a little  Social Connections: Moderately Integrated (09/08/2023)   Social Connection and Isolation Panel    Frequency of Communication with Friends and Family: More than three times a week    Frequency of Social Gatherings with Friends and Family: Once a week    Attends Religious  Services: 1 to 4 times per year    Active Member of Clubs or Organizations: No    Attends Engineer, structural: Not on file    Marital Status: Married    Tobacco Counseling Counseling given: Not Answered    Clinical Intake:  Pre-visit preparation completed: Yes  Pain : No/denies pain     BMI - recorded: 36.49 Nutritional Status: BMI > 30  Obese Nutritional Risks: None Diabetes: No  Lab Results  Component Value Date   HGBA1C 5.0 03/04/2023   HGBA1C 4.9 03/02/2022   HGBA1C 5.1 02/19/2020     How often do you need to have someone help you when you read instructions, pamphlets, or other written materials from your doctor or pharmacy?: 1 - Never  Interpreter Needed?: No  Comments: lives with wife Information entered by :: B.Overton Boggus,LPN   Activities of Daily Living     09/08/2023    9:46 AM  In your present state of health, do you have any difficulty performing the following activities:  Hearing? 0  Vision? 0  Difficulty concentrating or making decisions? 0  Walking or climbing stairs? 0  Dressing or bathing? 0  Doing errands, shopping? 0  Preparing Food and eating ? N  Using the Toilet? N  In the past six months, have you accidently leaked urine? N  Do you have problems with loss of bowel control? N  Managing your Medications? N  Managing your Finances? N  Housekeeping or managing  your Housekeeping? N    Patient Care Team: Gabriel John, NP as PCP - General (Internal Medicine) Jacqueline Matsu, MD as PCP - Cardiology (Cardiology) Homero Luster, MD as Attending Physician (Urology) Jonathan Neighbor, [facility] (Inactive) as Pharmacist (Pharmacist)  I have updated your Care Teams any recent Medical Services you may have received from other providers in the past year.     Assessment:   This is a routine wellness examination for Devin Howell.  Hearing/Vision screen Hearing Screening - Comments:: Pt says his hearing is good with hearing aids Vision Screening - Comments:: Pt says his vision is good after cataract surgery Dr Arlan Belling appt tomorrow   Goals Addressed             This Visit's Progress    COMPLETED: Blood Pressure < 130/80       DIET - EAT MORE FRUITS AND VEGETABLES   On track    09/12/23     Patient Stated   On track    09/12/23, I will continue to walk 4 days a week for several miles.        Depression Screen     09/12/2023    8:36 AM 03/04/2023   10:04 AM 09/09/2022    8:48 AM 03/02/2022   11:34 AM 09/16/2021    8:40 AM 09/15/2020    9:07 AM 02/19/2020   11:04 AM  PHQ 2/9 Scores  PHQ - 2 Score 0 0 0 0 0 0 0  PHQ- 9 Score    0 0 0 0    Fall Risk     09/08/2023    9:46 AM 03/04/2023   10:04 AM 09/09/2022    8:42 AM 09/08/2022   10:47 AM 03/02/2022   11:34 AM  Fall Risk   Falls in the past year? 0 0 0 0 0  Number falls in past yr: 0 0 0 0 0  Injury with Fall? 0 0 0 0 0  Risk for fall due to :  No Fall Risks No Fall Risks No Fall Risks    Follow up Education provided;Falls prevention discussed Falls evaluation completed Falls prevention discussed;Falls evaluation completed      MEDICARE RISK AT HOME:  Medicare Risk at Home Any stairs in or around the home?: (Patient-Rptd) Yes If so, are there any without handrails?: (Patient-Rptd) No Home free of loose throw rugs in walkways, pet beds, electrical cords, etc?: (Patient-Rptd) Yes Adequate  lighting in your home to reduce risk of falls?: (Patient-Rptd) Yes Life alert?: (Patient-Rptd) No Use of a cane, walker or w/c?: (Patient-Rptd) No Grab bars in the bathroom?: (Patient-Rptd) No Shower chair or bench in shower?: (Patient-Rptd) Yes Elevated toilet seat or a handicapped toilet?: (Patient-Rptd) No  TIMED UP AND GO:  Was the test performed?  No  Cognitive Function: 6CIT completed    09/15/2020    9:11 AM 02/08/2019    2:24 PM 12/16/2016   12:44 PM  MMSE - Mini Mental State Exam  Orientation to time 5 5 5    Orientation to Place 5 5 5    Registration 3 3 3    Attention/ Calculation 5 5 0   Recall 3 3 3    Language- name 2 objects   0   Language- repeat 1 1 1   Language- follow 3 step command   3   Language- read & follow direction   0   Write a sentence   0   Copy design   0   Total score   20      Data saved with a previous flowsheet row definition        09/12/2023    8:39 AM 09/09/2022    8:50 AM 09/16/2021    8:44 AM  6CIT Screen  What Year? 0 points 0 points 0 points  What month? 0 points 0 points 0 points  What time? 0 points 0 points 0 points  Count back from 20 0 points 0 points 0 points  Months in reverse 0 points 0 points 0 points  Repeat phrase 0 points 2 points 0 points  Total Score 0 points 2 points 0 points    Immunizations Immunization History  Administered Date(s) Administered   Fluad Quad(high Dose 65+) 12/17/2020   Influenza, High Dose Seasonal PF 12/08/2016, 12/15/2022   Influenza,inj,Quad PF,6+ Mos 11/02/2018   Influenza-Unspecified 11/27/2017, 12/25/2019, 12/27/2021   PFIZER(Purple Top)SARS-COV-2 Vaccination 04/23/2019, 05/14/2019, 12/25/2019, 05/26/2020   PNEUMOCOCCAL CONJUGATE-20 12/10/2020   Pfizer Covid-19 Vaccine Bivalent Booster 90yrs & up 12/11/2020   Pfizer(Comirnaty)Fall Seasonal Vaccine 12 years and older 06/29/2023   Pneumococcal Conjugate-13 02/12/2015   Pneumococcal Polysaccharide-23 12/16/2016   Td 12/16/2016   Zoster  Recombinant(Shingrix) 10/14/2016, 03/13/2017    Screening Tests Health Maintenance  Topic Date Due   INFLUENZA VACCINE  10/28/2023   Colonoscopy  04/08/2024   Medicare Annual Wellness (AWV)  09/11/2024   DTaP/Tdap/Td (2 - Tdap) 12/17/2026   Pneumococcal Vaccine: 50+ Years  Completed   Hepatitis C Screening  Completed   Zoster Vaccines- Shingrix  Completed   HPV VACCINES  Aged Out   Meningococcal B Vaccine  Aged Out   COVID-19 Vaccine  Discontinued    Health Maintenance  There are no preventive care reminders to display for this patient.  Health Maintenance Items Addressed: None at this time  Additional Screening:  Vision Screening: Recommended annual ophthalmology exams for early detection of glaucoma and other disorders of the eye. Would you like a referral to an eye doctor? No    Dental  Screening: Recommended annual dental exams for proper oral hygiene  Community Resource Referral / Chronic Care Management: CRR required this visit?  No   CCM required this visit?  No pt declined to schedule Cpe for Dec at this time. He will confer with his wife as they come at the same time he relays.   Plan:    I have personally reviewed and noted the following in the patient's chart:   Medical and social history Use of alcohol, tobacco or illicit drugs  Current medications and supplements including opioid prescriptions. Patient is not currently taking opioid prescriptions. Functional ability and status Nutritional status Physical activity Advanced directives List of other physicians Hospitalizations, surgeries, and ER visits in previous 12 months Vitals Screenings to include cognitive, depression, and falls Referrals and appointments  In addition, I have reviewed and discussed with patient certain preventive protocols, quality metrics, and best practice recommendations. A written personalized care plan for preventive services as well as general preventive health  recommendations were provided to patient.   Nerissa Bannister, LPN   07/10/2438   After Visit Summary: (MyChart) Due to this being a telephonic visit, the after visit summary with patients personalized plan was offered to patient via MyChart   Notes: Nothing significant to report at this time.

## 2023-09-13 DIAGNOSIS — H40013 Open angle with borderline findings, low risk, bilateral: Secondary | ICD-10-CM | POA: Diagnosis not present

## 2023-12-01 ENCOUNTER — Other Ambulatory Visit: Payer: Self-pay | Admitting: Primary Care

## 2023-12-01 DIAGNOSIS — Z23 Encounter for immunization: Secondary | ICD-10-CM

## 2023-12-01 MED ORDER — COVID-19 MRNA VACC (MODERNA) 50 MCG/0.5ML IM SUSP: 0.5000 mL | Freq: Once | INTRAMUSCULAR | 0 refills | Status: AC

## 2023-12-22 ENCOUNTER — Other Ambulatory Visit: Payer: Self-pay | Admitting: Primary Care

## 2023-12-22 DIAGNOSIS — M1A9XX0 Chronic gout, unspecified, without tophus (tophi): Secondary | ICD-10-CM

## 2023-12-22 DIAGNOSIS — F32A Depression, unspecified: Secondary | ICD-10-CM

## 2024-01-03 DIAGNOSIS — N1832 Chronic kidney disease, stage 3b: Secondary | ICD-10-CM | POA: Diagnosis not present

## 2024-01-10 DIAGNOSIS — R3911 Hesitancy of micturition: Secondary | ICD-10-CM | POA: Diagnosis not present

## 2024-01-10 DIAGNOSIS — E559 Vitamin D deficiency, unspecified: Secondary | ICD-10-CM | POA: Diagnosis not present

## 2024-01-10 DIAGNOSIS — N1832 Chronic kidney disease, stage 3b: Secondary | ICD-10-CM | POA: Diagnosis not present

## 2024-01-10 DIAGNOSIS — N200 Calculus of kidney: Secondary | ICD-10-CM | POA: Diagnosis not present

## 2024-01-10 DIAGNOSIS — I129 Hypertensive chronic kidney disease with stage 1 through stage 4 chronic kidney disease, or unspecified chronic kidney disease: Secondary | ICD-10-CM | POA: Diagnosis not present

## 2024-01-10 DIAGNOSIS — R809 Proteinuria, unspecified: Secondary | ICD-10-CM | POA: Diagnosis not present

## 2024-01-28 ENCOUNTER — Other Ambulatory Visit: Payer: Self-pay | Admitting: Primary Care

## 2024-01-28 DIAGNOSIS — E039 Hypothyroidism, unspecified: Secondary | ICD-10-CM

## 2024-03-08 ENCOUNTER — Encounter: Admitting: Primary Care

## 2024-03-22 ENCOUNTER — Other Ambulatory Visit: Payer: Self-pay | Admitting: Primary Care

## 2024-03-22 DIAGNOSIS — M1A9XX0 Chronic gout, unspecified, without tophus (tophi): Secondary | ICD-10-CM

## 2024-03-22 DIAGNOSIS — F419 Anxiety disorder, unspecified: Secondary | ICD-10-CM

## 2024-04-03 ENCOUNTER — Ambulatory Visit: Payer: Self-pay | Admitting: Primary Care

## 2024-04-03 ENCOUNTER — Ambulatory Visit: Admitting: Primary Care

## 2024-04-03 ENCOUNTER — Encounter: Payer: Self-pay | Admitting: Primary Care

## 2024-04-03 VITALS — BP 134/66 | HR 61 | Temp 97.8°F | Ht 65.5 in | Wt 233.4 lb

## 2024-04-03 DIAGNOSIS — E349 Endocrine disorder, unspecified: Secondary | ICD-10-CM | POA: Diagnosis not present

## 2024-04-03 DIAGNOSIS — E039 Hypothyroidism, unspecified: Secondary | ICD-10-CM

## 2024-04-03 DIAGNOSIS — Z0000 Encounter for general adult medical examination without abnormal findings: Secondary | ICD-10-CM

## 2024-04-03 DIAGNOSIS — E782 Mixed hyperlipidemia: Secondary | ICD-10-CM | POA: Diagnosis not present

## 2024-04-03 DIAGNOSIS — M1A9XX0 Chronic gout, unspecified, without tophus (tophi): Secondary | ICD-10-CM

## 2024-04-03 DIAGNOSIS — Z1211 Encounter for screening for malignant neoplasm of colon: Secondary | ICD-10-CM

## 2024-04-03 DIAGNOSIS — F419 Anxiety disorder, unspecified: Secondary | ICD-10-CM | POA: Diagnosis not present

## 2024-04-03 DIAGNOSIS — F32A Depression, unspecified: Secondary | ICD-10-CM

## 2024-04-03 DIAGNOSIS — I10 Essential (primary) hypertension: Secondary | ICD-10-CM | POA: Diagnosis not present

## 2024-04-03 DIAGNOSIS — Z125 Encounter for screening for malignant neoplasm of prostate: Secondary | ICD-10-CM

## 2024-04-03 DIAGNOSIS — A6000 Herpesviral infection of urogenital system, unspecified: Secondary | ICD-10-CM

## 2024-04-03 LAB — COMPREHENSIVE METABOLIC PANEL WITH GFR
ALT: 40 U/L (ref 3–53)
AST: 41 U/L — ABNORMAL HIGH (ref 5–37)
Albumin: 4.3 g/dL (ref 3.5–5.2)
Alkaline Phosphatase: 70 U/L (ref 39–117)
BUN: 18 mg/dL (ref 6–23)
CO2: 28 meq/L (ref 19–32)
Calcium: 9.1 mg/dL (ref 8.4–10.5)
Chloride: 99 meq/L (ref 96–112)
Creatinine, Ser: 1.42 mg/dL (ref 0.40–1.50)
GFR: 48.51 mL/min — ABNORMAL LOW
Glucose, Bld: 94 mg/dL (ref 70–99)
Potassium: 4 meq/L (ref 3.5–5.1)
Sodium: 136 meq/L (ref 135–145)
Total Bilirubin: 1 mg/dL (ref 0.2–1.2)
Total Protein: 6.9 g/dL (ref 6.0–8.3)

## 2024-04-03 LAB — LIPID PANEL
Cholesterol: 254 mg/dL — ABNORMAL HIGH (ref 28–200)
HDL: 77.6 mg/dL
LDL Cholesterol: 151 mg/dL — ABNORMAL HIGH (ref 10–99)
NonHDL: 175.9
Total CHOL/HDL Ratio: 3
Triglycerides: 127 mg/dL (ref 10.0–149.0)
VLDL: 25.4 mg/dL (ref 0.0–40.0)

## 2024-04-03 LAB — TSH: TSH: 4.68 u[IU]/mL (ref 0.35–5.50)

## 2024-04-03 LAB — PSA: PSA: 0.98 ng/mL (ref 0.10–4.00)

## 2024-04-03 LAB — URIC ACID: Uric Acid, Serum: 3.5 mg/dL — ABNORMAL LOW (ref 4.0–7.8)

## 2024-04-03 NOTE — Assessment & Plan Note (Signed)
 No flares in years.  Continue Valtrex  as needed.

## 2024-04-03 NOTE — Assessment & Plan Note (Addendum)
 No recent gout flares.  Continue allopurinol  300 mg daily. Uric acid level pending.

## 2024-04-03 NOTE — Assessment & Plan Note (Signed)
 Immunizations UTD. Colonoscopy due, referral placed to GI to learn if this is needed. PSA due and pending.  Discussed the importance of a healthy diet and regular exercise in order for weight loss, and to reduce the risk of further co-morbidity.  Exam stable. Labs pending.  Follow up in 1 year for repeat physical.

## 2024-04-03 NOTE — Assessment & Plan Note (Signed)
 Repeat lipid panel pending. Continue atorvastatin 20 mg daily.

## 2024-04-03 NOTE — Assessment & Plan Note (Signed)
Controlled.  Continue citalopram 20 mg daily.

## 2024-04-03 NOTE — Patient Instructions (Signed)
 Stop by the lab prior to leaving today. I will notify you of your results once received.   You will receive a phone call regarding the colonoscopy.  It was a pleasure to see you today!

## 2024-04-03 NOTE — Progress Notes (Signed)
 "  Subjective:    Patient ID: Devin Howell, male    DOB: 1950-10-16, 74 y.o.   MRN: [id number]  Devin Howell is a very pleasant 74 y.o. male who presents today for complete physical and follow up of chronic conditions.  Immunizations: -Tetanus: Completed in 2018 -Influenza: Completed this season -Shingles: Completed Shingrix series -Pneumonia: Completed Prevnar 20 in 2022  Diet: Fair diet.  Exercise: Regular exercise.  Eye exam: Completes annually  Dental exam: Completes semi-annually    Colonoscopy: Completed in 2019, due 2026  PSA: Due   BP Readings from Last 3 Encounters:  04/03/24 134/66  03/04/23 (!) 148/86  04/01/22 (!) 145/72    He is checking his BP at home and is running 130/70.    Review of Systems  Constitutional:  Negative for unexpected weight change.  HENT:  Negative for rhinorrhea.   Respiratory:  Negative for cough and shortness of breath.   Cardiovascular:  Negative for chest pain.  Gastrointestinal:  Negative for constipation and diarrhea.  Genitourinary:  Negative for difficulty urinating.  Musculoskeletal:  Positive for arthralgias.  Skin:  Negative for rash.  Allergic/Immunologic: Negative for environmental allergies.  Neurological:  Negative for dizziness, numbness and headaches.  Psychiatric/Behavioral:  The patient is not nervous/anxious.          Past Medical History:  Diagnosis Date   Abnormal gait 12/24/2020   AKI (acute kidney injury) 08/15/2020   Cataract    Chronic gout    CKD (chronic kidney disease)    pt. denies   Depression 2000   Erectile dysfunction    GERD (gastroesophageal reflux disease)    Herpes simplex    Hx of adenomatous polyp of colon 04/12/2017   Hyperlipidemia    pt. denies   Hypertension    pt. denies   OSA on CPAP    Sleep apnea    cpap   Subarachnoid hematoma (HCC)    Subdural hematoma (HCC)    Testosterone  deficiency     Social History   Socioeconomic History   Marital status:  Married    Spouse name: Not on file   Number of children: Not on file   Years of education: Not on file   Highest education level: Bachelor's degree (e.g., BA, AB, BS)  Occupational History   Not on file  Tobacco Use   Smoking status: Never    Passive exposure: Never   Smokeless tobacco: Never  Vaping Use   Vaping status: Never Used  Substance and Sexual Activity   Alcohol use: Yes    Alcohol/week: 10.0 standard drinks of alcohol    Types: 10 Glasses of wine per week   Drug use: Never   Sexual activity: Not Currently  Other Topics Concern   Not on file  Social History Narrative   Lives with wife at home    Right handed    Some caffeine intake    Social Drivers of Health   Tobacco Use: Low Risk (04/03/2024)   Patient History    Smoking Tobacco Use: Never    Smokeless Tobacco Use: Never    Passive Exposure: Never  Financial Resource Strain: Low Risk (03/04/2024)   Overall Financial Resource Strain (CARDIA)    Difficulty of Paying Living Expenses: Not hard at all  Food Insecurity: No Food Insecurity (03/04/2024)   Epic    Worried About Radiation Protection Practitioner of Food in the Last Year: Never true    Ran Out of Food in the Last Year: Never  true  Transportation Needs: No Transportation Needs (03/04/2024)   Epic    Lack of Transportation (Medical): No    Lack of Transportation (Non-Medical): No  Physical Activity: Sufficiently Active (03/04/2024)   Exercise Vital Sign    Days of Exercise per Week: 5 days    Minutes of Exercise per Session: 60 min  Stress: No Stress Concern Present (03/04/2024)   [employer] of Occupational Health - Occupational Stress Questionnaire    Feeling of Stress: Only a little  Social Connections: Moderately Integrated (03/04/2024)   Social Connection and Isolation Panel    Frequency of Communication with Friends and Family: More than three times a week    Frequency of Social Gatherings with Friends and Family: Twice a week    Attends Religious Services: 1 to  4 times per year    Active Member of [employer] or Organizations: No    Attends Banker Meetings: Not on file    Marital Status: Married  Catering Manager Violence: Not At Risk (09/12/2023)   Epic    Fear of Current or Ex-Partner: No    Emotionally Abused: No    Physically Abused: No    Sexually Abused: No  Depression (PHQ2-9): Low Risk (04/03/2024)   Depression (PHQ2-9)    PHQ-2 Score: 0  Alcohol Screen: Medium Risk (03/04/2024)   Alcohol Screen    Last Alcohol Screening Score (AUDIT): 12  Housing: Unknown (03/04/2024)   Epic    Unable to Pay for Housing in the Last Year: No    Number of Times Moved in the Last Year: Not on file    Homeless in the Last Year: No  Utilities: Not At Risk (09/09/2022)   AHC Utilities    Threatened with loss of utilities: No  Health Literacy: Adequate Health Literacy (09/12/2023)   B1300 Health Literacy    Frequency of need for help with medical instructions: Never    Past Surgical History:  Procedure Laterality Date   COLONOSCOPY     ESOPHAGOGASTRODUODENOSCOPY     EYE SURGERY  2020   cataracts   TENDON REPAIR Left    TONSILLECTOMY AND ADENOIDECTOMY  1960    Family History  Problem Relation Age of Onset   Rectal cancer Mother    Cancer Mother    Heart Problems Father    Heart disease Father    Colon polyps Neg Hx    Esophageal cancer Neg Hx    Colon cancer Neg Hx    Stomach cancer Neg Hx     Allergies[1]  Medications Ordered Prior to Encounter[2]  BP 134/66   Pulse 61   Temp 97.8 F (36.6 C) (Oral)   Ht 5' 5.5 (1.664 m)   Wt 233 lb 6 oz (105.9 kg)   SpO2 95%   BMI 38.24 kg/m  Objective:   Physical Exam HENT:     Right Ear: Tympanic membrane and ear canal normal.     Left Ear: Tympanic membrane and ear canal normal.  Eyes:     Pupils: Pupils are equal, round, and reactive to light.  Cardiovascular:     Rate and Rhythm: Normal rate and regular rhythm.  Pulmonary:     Effort: Pulmonary effort is normal.     Breath  sounds: Normal breath sounds.  Abdominal:     General: Bowel sounds are normal.     Palpations: Abdomen is soft.     Tenderness: There is no abdominal tenderness.  Musculoskeletal:  General: Normal range of motion.     Cervical back: Neck supple.  Skin:    General: Skin is warm and dry.  Neurological:     Mental Status: He is alert and oriented to person, place, and time.     Cranial Nerves: No cranial nerve deficit.     Deep Tendon Reflexes:     Reflex Scores:      Patellar reflexes are 2+ on the right side and 2+ on the left side. Psychiatric:        Mood and Affect: Mood normal.     Physical Exam        Assessment & Plan:  Preventative health care Assessment & Plan: Immunizations UTD. Colonoscopy due, referral placed to GI to learn if this is needed. PSA due and pending.  Discussed the importance of a healthy diet and regular exercise in order for weight loss, and to reduce the risk of further co-morbidity.  Exam stable. Labs pending.  Follow up in 1 year for repeat physical.    Hypothyroidism, unspecified type Assessment & Plan: He is taking levothyroxine  correctly.  Continue levothyroxine  50 mcg daily. Repeat TSH pending.  Orders: -     TSH  Mixed hyperlipidemia Assessment & Plan: Repeat lipid panel pending.  Continue atorvastatin  20 mg daily  Orders: -     Lipid panel  Essential hypertension Assessment & Plan: Controlled.  Continue hydralazine  25 mg twice daily, metoprolol  succinate 50 mg daily.  Orders: -     Comprehensive metabolic panel with GFR  Chronic gout without tophus, unspecified cause, unspecified site Assessment & Plan: No recent gout flares.  Continue allopurinol  300 mg daily. Uric acid level pending.  Orders: -     Uric acid  Screening for colon cancer -     Ambulatory referral to Gastroenterology  Genital herpes simplex, unspecified site Assessment & Plan: No flares in years.  Continue Valtrex  as  needed.   Testosterone  deficiency Assessment & Plan: Following with urology.  Continue testosterone  100 mg once weekly.   Screening for prostate cancer -     PSA  Anxiety and depression Assessment & Plan: Controlled.  Continue citalopram  20 mg daily.     Assessment and Plan Assessment & Plan         Comer MARLA Gaskins, NP       [1]  Allergies Allergen Reactions   Valsartan -Hydrochlorothiazide     Other Reaction(s): creatinine elevation  [2]  Current Outpatient Medications on File Prior to Visit  Medication Sig Dispense Refill   allopurinol  (ZYLOPRIM ) 300 MG tablet TAKE 1 TABLET BY MOUTH EVERY DAY FOR GOUT PREVENTION 90 tablet 0   atorvastatin  (LIPITOR) 20 MG tablet TAKE 1 TABLET BY MOUTH EVERY DAY FOR CHOLESTEROL 90 tablet 2   citalopram  (CELEXA ) 20 MG tablet TAKE 1 TABLET (20 MG TOTAL) BY MOUTH DAILY. FOR ANXIETY AND DEPRESSION. 90 tablet 0   hydrALAZINE  (APRESOLINE ) 25 MG tablet Take 25 mg by mouth 2 (two) times daily.     levothyroxine  (SYNTHROID ) 50 MCG tablet TAKE 1 TAB BY MOUTH EVERY MORNING ON EMPTY STOMACH WITH WATER ONLY NO FOOD/OTHER MEDS FOR 30 MINS 90 tablet 0   metoprolol  succinate (TOPROL -XL) 50 MG 24 hr tablet Take 50 mg by mouth daily.     testosterone  cypionate (DEPOTESTOSTERONE CYPIONATE) 200 MG/ML injection Inject 100 mg into the muscle once a week.     No current facility-administered medications on file prior to visit.   "

## 2024-04-03 NOTE — Assessment & Plan Note (Signed)
 Controlled.  Continue hydralazine  25 mg twice daily, metoprolol  succinate 50 mg daily.

## 2024-04-03 NOTE — Assessment & Plan Note (Signed)
 He is taking levothyroxine correctly. Continue levothyroxine 50 mcg daily.   Repeat TSH pending.

## 2024-04-03 NOTE — Assessment & Plan Note (Signed)
 Following with urology.  Continue testosterone  100 mg once weekly.

## 2024-04-26 ENCOUNTER — Other Ambulatory Visit: Payer: Self-pay | Admitting: Primary Care

## 2024-04-26 DIAGNOSIS — E039 Hypothyroidism, unspecified: Secondary | ICD-10-CM

## 2024-09-14 ENCOUNTER — Ambulatory Visit
